# Patient Record
Sex: Female | Born: 1945
Health system: Southern US, Community
[De-identification: ages and names within clinical notes are randomized; demographics above are authoritative.]

## PROBLEM LIST (undated history)

## (undated) DIAGNOSIS — E781 Pure hyperglyceridemia: Secondary | ICD-10-CM

## (undated) DIAGNOSIS — Z8619 Personal history of other infectious and parasitic diseases: Secondary | ICD-10-CM

## (undated) HISTORY — DX: Personal history of other infectious and parasitic diseases: Z86.19

## (undated) HISTORY — DX: Pure hyperglyceridemia: E78.1

## (undated) HISTORY — PX: CHOLECYSTECTOMY: SHX55

---

## 1998-06-21 ENCOUNTER — Ambulatory Visit (HOSPITAL_COMMUNITY): Admission: RE | Admit: 1998-06-21 | Discharge: 1998-06-22 | Payer: Self-pay | Admitting: General Surgery

## 2001-03-12 ENCOUNTER — Encounter: Admission: RE | Admit: 2001-03-12 | Discharge: 2001-03-12 | Payer: Self-pay | Admitting: *Deleted

## 2001-03-12 ENCOUNTER — Encounter: Payer: Self-pay | Admitting: *Deleted

## 2001-04-21 ENCOUNTER — Other Ambulatory Visit: Admission: RE | Admit: 2001-04-21 | Discharge: 2001-04-21 | Payer: Self-pay | Admitting: *Deleted

## 2002-09-07 ENCOUNTER — Ambulatory Visit (HOSPITAL_COMMUNITY): Admission: RE | Admit: 2002-09-07 | Discharge: 2002-09-07 | Payer: Self-pay | Admitting: Gastroenterology

## 2003-06-23 ENCOUNTER — Encounter: Admission: RE | Admit: 2003-06-23 | Discharge: 2003-06-23 | Payer: Self-pay | Admitting: Family Medicine

## 2003-06-23 ENCOUNTER — Other Ambulatory Visit: Admission: RE | Admit: 2003-06-23 | Discharge: 2003-06-23 | Payer: Self-pay | Admitting: Family Medicine

## 2004-09-04 ENCOUNTER — Other Ambulatory Visit: Admission: RE | Admit: 2004-09-04 | Discharge: 2004-09-04 | Payer: Self-pay | Admitting: Family Medicine

## 2009-04-29 ENCOUNTER — Encounter: Admission: RE | Admit: 2009-04-29 | Discharge: 2009-04-29 | Payer: Self-pay | Admitting: Family Medicine

## 2009-06-20 ENCOUNTER — Encounter: Admission: RE | Admit: 2009-06-20 | Discharge: 2009-07-28 | Payer: Self-pay | Admitting: Family Medicine

## 2010-05-24 ENCOUNTER — Telehealth: Payer: Self-pay | Admitting: *Deleted

## 2010-05-24 NOTE — Telephone Encounter (Signed)
Open in error

## 2010-05-31 ENCOUNTER — Ambulatory Visit: Payer: Self-pay | Admitting: Internal Medicine

## 2010-06-05 ENCOUNTER — Ambulatory Visit (INDEPENDENT_AMBULATORY_CARE_PROVIDER_SITE_OTHER): Payer: BC Managed Care – PPO | Admitting: Internal Medicine

## 2010-06-05 ENCOUNTER — Encounter: Payer: Self-pay | Admitting: Internal Medicine

## 2010-06-05 VITALS — BP 145/90 | HR 66 | Ht 64.0 in | Wt 140.0 lb

## 2010-06-05 DIAGNOSIS — R030 Elevated blood-pressure reading, without diagnosis of hypertension: Secondary | ICD-10-CM

## 2010-06-05 DIAGNOSIS — H04123 Dry eye syndrome of bilateral lacrimal glands: Secondary | ICD-10-CM

## 2010-06-05 DIAGNOSIS — M949 Disorder of cartilage, unspecified: Secondary | ICD-10-CM

## 2010-06-05 DIAGNOSIS — Z0000 Encounter for general adult medical examination without abnormal findings: Secondary | ICD-10-CM

## 2010-06-05 DIAGNOSIS — J309 Allergic rhinitis, unspecified: Secondary | ICD-10-CM

## 2010-06-05 DIAGNOSIS — H04129 Dry eye syndrome of unspecified lacrimal gland: Secondary | ICD-10-CM

## 2010-06-05 DIAGNOSIS — J302 Other seasonal allergic rhinitis: Secondary | ICD-10-CM | POA: Insufficient documentation

## 2010-06-05 DIAGNOSIS — E781 Pure hyperglyceridemia: Secondary | ICD-10-CM | POA: Insufficient documentation

## 2010-06-05 DIAGNOSIS — R7301 Impaired fasting glucose: Secondary | ICD-10-CM | POA: Insufficient documentation

## 2010-06-05 DIAGNOSIS — M8580 Other specified disorders of bone density and structure, unspecified site: Secondary | ICD-10-CM | POA: Insufficient documentation

## 2010-06-05 DIAGNOSIS — M899 Disorder of bone, unspecified: Secondary | ICD-10-CM

## 2010-06-05 DIAGNOSIS — E782 Mixed hyperlipidemia: Secondary | ICD-10-CM

## 2010-06-05 NOTE — Patient Instructions (Addendum)
Monitor your Blood pressure  Readings  Contact us if 140/90 and up on a consistent basis. Check up with lab and Hg a1c    In the summer .  Continue lifestyle intervention healthy eating and exercise .

## 2010-06-05 NOTE — Progress Notes (Signed)
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  Her previous pcp was Dr Raquel James who since left her practice. Last check about October 2011.   She is generally well  But has  Ongoing medication for Hyperlipidemia and has been on Niaspan for about 5 years without se. She also has  Mild  hyperglycemia  Managed with life style intervention.  Seasonal allergies are rx with otc meds andno asthma.  ROS :  HEENT neg  Except for some hearing issue wax ineac  CV PULM: No Exercise intolerance  intolerance . chronic sx , Neg GI or GU and no bleeding swelling Neuro disease . She does have dry eyes on restasis Past hx reviewed records; hx of fracture foot  1995 Thyroididtis 1982 Child birth 32 Physical Exam: Vital signs reviewed MWU:XLKG is a well-developed well-nourished alert cooperative  white female who appears her stated age in no acute distress.  HEENT: normocephalic  traumatic , Eyes: PERRL EOM's full, conjunctiva clear, Nares: paten,t no deformity discharge or tenderness., Ears: no deformity  TMs with normal landmarks. Mouth: clear OP, no lesions, edema.  Moist mucous membranes. Dentition in adequate repair. NECK: supple without masses, thyromegaly or bruits. CHEST/PULM:  Clear to auscultation and percussion breath sounds equal no wheeze , rales or rhonchi. No chest wall deformities or tenderness. CV: PMI is nondisplaced, S1 S2 no gallops, murmurs, rubs. Peripheral pulses are full without delay.No JVD .  ABDOMEN: Bowel sounds normal nontender  No guard or rebound, no hepato splenomegal no CVA tenderness.  No hernia. Extremtities:  No clubbing cyanosis or edema, no acute joint swelling or redness no focal atrophy NEURO:  Oriented x3, cranial nerves 3-12 appear to be intact, no obvious focal weakness,gait within normal limits  SKIN: No acute rashes normal turgor, color, no bruising or petechiae. PSYCH: Oriented, good eye  contact, no obvious depression anxiety, cognition and judgment appear normal.  Has  Medical records to review   UTD on immuniz  Tdap 11 26  2007 and zostavax 1 6 2010, pneumovax 12 4 2010.  AP:  Hyperlipidemia  On niastapn Osteopenia Hyperglycemia   ? Predated the niaspan.  Family hx of DM in father Allergic rhinitis Elevated BP readings   Observe for HT review of records show highers 138 range  In 2011

## 2010-06-11 ENCOUNTER — Encounter: Payer: Self-pay | Admitting: Internal Medicine

## 2010-06-11 DIAGNOSIS — E782 Mixed hyperlipidemia: Secondary | ICD-10-CM | POA: Insufficient documentation

## 2010-06-11 DIAGNOSIS — Z0000 Encounter for general adult medical examination without abnormal findings: Secondary | ICD-10-CM | POA: Insufficient documentation

## 2010-06-11 DIAGNOSIS — H04123 Dry eye syndrome of bilateral lacrimal glands: Secondary | ICD-10-CM | POA: Insufficient documentation

## 2010-06-11 NOTE — Assessment & Plan Note (Signed)
She will monitor her bp readings and call or return earlier if elevated consistently

## 2010-06-11 NOTE — Assessment & Plan Note (Signed)
Monitoring lsi    A1c   Has been in high 5 range .

## 2010-06-11 NOTE — Assessment & Plan Note (Signed)
On niaspan for at least 5 years  And no se of meds.   Continues  exercise and diet changes

## 2010-07-17 ENCOUNTER — Encounter: Payer: Self-pay | Admitting: Internal Medicine

## 2010-08-01 ENCOUNTER — Other Ambulatory Visit: Payer: Self-pay | Admitting: *Deleted

## 2010-08-01 MED ORDER — NIACIN ER (ANTIHYPERLIPIDEMIC) 500 MG PO TBCR: 500.0000 mg | EXTENDED_RELEASE_TABLET | Freq: Every day | ORAL | Status: DC

## 2010-08-01 NOTE — Telephone Encounter (Signed)
Refill on niaspan

## 2010-08-16 ENCOUNTER — Encounter: Payer: Self-pay | Admitting: Internal Medicine

## 2010-08-28 ENCOUNTER — Encounter: Payer: Self-pay | Admitting: Internal Medicine

## 2010-09-08 NOTE — Op Note (Signed)
   Tina [name], Tina [name]                             ACCOUNT NO.:  [id number]   MEDICAL RECORD NO.:  [id number]                   PATIENT TYPE:  AMB   LOCATION:  ENDO                                 FACILITY:  [facility]   PHYSICIAN:  Anselmo Rod, M.D.               DATE OF BIRTH:  07-Dec-1945   DATE OF PROCEDURE:  09/07/2002  DATE OF DISCHARGE:                                 OPERATIVE REPORT   PROCEDURE PERFORMED:  Screening colonoscopy.   ENDOSCOPIST:  Anselmo Rod, M.D.   INSTRUMENT USED:  Olympus video colonoscope.   INDICATIONS FOR PROCEDURE:  A 65 year old white female undergoing screening  colonoscopy to rule out colonic polyps, masses, etc.   PREPROCEDURE PREPARATION:  Informed consent was procured from the patient.  The patient had fasted for eight hours prior to the procedure and was  prepped with a bottle of magnesium citrate and a gallon of GoLYTELY the  night prior to the procedure.   PREPROCEDURE PHYSICAL:  VITAL SIGNS:  The patient had stable vital signs.  NECK:  Supple.  CHEST: Clear to auscultation.  S1 and S2 regular.  ABDOMEN: Soft with normal bowel sounds.   DESCRIPTION OF PROCEDURE:  The patient was placed in the left lateral  decubitus position and sedated with 50 mg of Demerol and 5 mg of Versed  intravenously.  Once the patient was adequately sedated and maintained on  low flow oxygen, continuous cardiac monitoring, the Olympus video  colonoscope was advanced from the rectum, to the cecum and terminal ileum  without difficulty.  No masses, polyps, erosions, ulcerations or diverticula  were seen.  The entire exam was normal with healthy-appearing colonic  mucosa.  Retroflexion in the rectum revealed no abnormalities as well.   IMPRESSION:  Normal colonoscopy up to the terminal ileum.   RECOMMENDATIONS:  1. A high fiber diet with liberal fluid intake has been advised.  2.     Repeat colorectal cancer screening is recommended in the next 10 years  unless the patient develops any abdominal symptoms in the interim.  3. Outpatient follow up on a p.r.n. basis.                                               Anselmo Rod, M.D.    JNM/MEDQ  D:  09/07/2002  T:  09/07/2002  Job:  [id number]   cc:   Christella Noa, M.D.  [address] [place]  [place], [place] [address]  Fax: [phone]

## 2010-11-07 ENCOUNTER — Other Ambulatory Visit (INDEPENDENT_AMBULATORY_CARE_PROVIDER_SITE_OTHER): Payer: BC Managed Care – PPO

## 2010-11-07 DIAGNOSIS — Z0000 Encounter for general adult medical examination without abnormal findings: Secondary | ICD-10-CM

## 2010-11-07 LAB — CBC WITH DIFFERENTIAL/PLATELET
Basophils Absolute: 0 10*3/uL (ref 0.0–0.1)
Basophils Relative: 0.6 % (ref 0.0–3.0)
Eosinophils Absolute: 0.2 10*3/uL (ref 0.0–0.7)
MCHC: 33.8 g/dL (ref 30.0–36.0)
MCV: 87.6 fl (ref 78.0–100.0)
Monocytes Absolute: 0.3 10*3/uL (ref 0.1–1.0)
Neutrophils Relative %: 50.1 % (ref 43.0–77.0)
Platelets: 284 10*3/uL (ref 150.0–400.0)
RBC: 4.56 Mil/uL (ref 3.87–5.11)
RDW: 14.7 % — ABNORMAL HIGH (ref 11.5–14.6)

## 2010-11-07 LAB — BASIC METABOLIC PANEL
BUN: 18 mg/dL (ref 6–23)
CO2: 27 mEq/L (ref 19–32)
Chloride: 108 mEq/L (ref 96–112)
Creatinine, Ser: 0.8 mg/dL (ref 0.4–1.2)
Glucose, Bld: 113 mg/dL — ABNORMAL HIGH (ref 70–99)

## 2010-11-07 LAB — POCT URINALYSIS DIPSTICK
Ketones, UA: NEGATIVE
Leukocytes, UA: NEGATIVE
Protein, UA: NEGATIVE
pH, UA: 7.5

## 2010-11-07 LAB — HEPATIC FUNCTION PANEL
ALT: 25 U/L (ref 0–35)
Total Bilirubin: 0.6 mg/dL (ref 0.3–1.2)

## 2010-11-07 LAB — LIPID PANEL
Total CHOL/HDL Ratio: 3
VLDL: 17 mg/dL (ref 0.0–40.0)

## 2010-11-07 LAB — LDL CHOLESTEROL, DIRECT: Direct LDL: 160.4 mg/dL

## 2010-11-15 ENCOUNTER — Encounter: Payer: Self-pay | Admitting: Internal Medicine

## 2010-11-15 ENCOUNTER — Ambulatory Visit (INDEPENDENT_AMBULATORY_CARE_PROVIDER_SITE_OTHER): Payer: BC Managed Care – PPO | Admitting: Internal Medicine

## 2010-11-15 VITALS — BP 160/90 | HR 92 | Wt 139.0 lb

## 2010-11-15 DIAGNOSIS — E782 Mixed hyperlipidemia: Secondary | ICD-10-CM

## 2010-11-15 DIAGNOSIS — M25549 Pain in joints of unspecified hand: Secondary | ICD-10-CM

## 2010-11-15 DIAGNOSIS — J309 Allergic rhinitis, unspecified: Secondary | ICD-10-CM

## 2010-11-15 DIAGNOSIS — R030 Elevated blood-pressure reading, without diagnosis of hypertension: Secondary | ICD-10-CM

## 2010-11-15 DIAGNOSIS — J302 Other seasonal allergic rhinitis: Secondary | ICD-10-CM

## 2010-11-15 MED ORDER — FLUTICASONE PROPIONATE 50 MCG/ACT NA SUSP: 2.0000 | Freq: Every day | NASAL | Status: DC

## 2010-11-15 NOTE — Progress Notes (Signed)
  Subjective:    Patient ID: Tina [name], female    DOB: [birth year], 65 y.o.   MRN: [id number]  HPI  Comes in today for  multiple medical problems  BP: reading at home very good 2 reading in 140s.  Brings in readings with her machine to review,   Pain left PIP index finger  Left hand  Not hot  Or red  And overnight increase and then gets better . No rx and no trauma or reason for this   Allergy and nose stuffiness    Zyrtec.   Not a lot or runny . No cough or asthma sx .   LIPIDS: taking niaspa 500 per day no se .   alos otc omega 3  Review of Systems Neg cp sob vision change. abd pain uti sx and bruising or bleeding . Neuro sx   Past history family history social history reviewed in the electronic medical record.     Objective:   Physical Exam WDWN in nad  HEENT: mild congestion  nose  No dc  Chest:  Clear to A&P without wheezes rales or rhonchi CV:  S1-S2 no gallops or murmurs peripheral perfusion is normal Extr no swelling or synovitis  Good grip some oa changes . Review  Bp readings    a1c 6.2  See lipids  Potassium 5.4  prob lab effect    Assessment & Plan:  BP readings   Poss white coat effect .    Bring in machine to next visit and keep monitoring for now.  Hyperlipidemia    No se of meds  Consider increase to 1000 niaspan prefers to stay at same dose and Intensify lifestyle interventions.  Hand sx poss early OA but not devilitating and no other assoc sx  .  Will follow  Up if  persistent or progressive   Allergic rhinitis Disc  rx options   Add nasal steroids for th stuffiness.    LIPIDS BG Hand s prob mild  OA but fu if  persistent or progressive  Allergic rhinitis seasonal.   Total visit > 50% spent counseling and coordinating care

## 2010-11-15 NOTE — Patient Instructions (Signed)
Continue lifestyle intervention healthy eating and exercise . Add nasal cortisone for allergic  Stuffy nose. Recheck if problems with joints. Your blood pressure is good at home. Bring in your machine at your next visit.

## 2010-11-18 ENCOUNTER — Encounter: Payer: Self-pay | Admitting: Internal Medicine

## 2010-11-18 DIAGNOSIS — M25549 Pain in joints of unspecified hand: Secondary | ICD-10-CM | POA: Insufficient documentation

## 2010-12-22 ENCOUNTER — Other Ambulatory Visit: Payer: Self-pay | Admitting: *Deleted

## 2010-12-22 MED ORDER — NIACIN ER (ANTIHYPERLIPIDEMIC) 500 MG PO TBCR: 500.0000 mg | EXTENDED_RELEASE_TABLET | Freq: Every day | ORAL | Status: DC

## 2010-12-22 NOTE — Telephone Encounter (Signed)
Refill rx

## 2011-04-29 ENCOUNTER — Other Ambulatory Visit: Payer: Self-pay | Admitting: Internal Medicine

## 2011-06-29 ENCOUNTER — Other Ambulatory Visit (INDEPENDENT_AMBULATORY_CARE_PROVIDER_SITE_OTHER): Payer: BC Managed Care – PPO

## 2011-06-29 DIAGNOSIS — Z0000 Encounter for general adult medical examination without abnormal findings: Secondary | ICD-10-CM

## 2011-06-29 LAB — HEPATIC FUNCTION PANEL
ALT: 17 U/L (ref 0–35)
AST: 20 U/L (ref 0–37)
Albumin: 4 g/dL (ref 3.5–5.2)
Alkaline Phosphatase: 65 U/L (ref 39–117)
Total Bilirubin: 0.6 mg/dL (ref 0.3–1.2)

## 2011-06-29 LAB — BASIC METABOLIC PANEL
GFR: 83.85 mL/min (ref >60.00)
Potassium: 5 mEq/L (ref 3.5–5.1)
Sodium: 139 mEq/L (ref 135–145)

## 2011-06-29 LAB — CBC WITH DIFFERENTIAL/PLATELET
Eosinophils Relative: 3.2 % (ref 0.0–5.0)
HCT: 40.4 % (ref 36.0–46.0)
Hemoglobin: 13.6 g/dL (ref 12.0–15.0)
Lymphocytes Relative: 36.5 % (ref 12.0–46.0)
Lymphs Abs: 1.9 10*3/uL (ref 0.7–4.0)
Monocytes Relative: 7.4 % (ref 3.0–12.0)
Neutro Abs: 2.7 10*3/uL (ref 1.4–7.7)
WBC: 5.1 10*3/uL (ref 4.5–10.5)

## 2011-06-29 LAB — LIPID PANEL
Total CHOL/HDL Ratio: 3
VLDL: 15.8 mg/dL (ref 0.0–40.0)

## 2011-06-29 LAB — TSH: TSH: 1.98 u[IU]/mL (ref 0.35–5.50)

## 2011-07-06 ENCOUNTER — Encounter: Payer: Self-pay | Admitting: Internal Medicine

## 2011-07-06 ENCOUNTER — Ambulatory Visit (INDEPENDENT_AMBULATORY_CARE_PROVIDER_SITE_OTHER): Payer: BC Managed Care – PPO | Admitting: Internal Medicine

## 2011-07-06 ENCOUNTER — Other Ambulatory Visit (HOSPITAL_COMMUNITY)
Admission: RE | Admit: 2011-07-06 | Discharge: 2011-07-06 | Disposition: A | Discharge status: Home / Self Care | Payer: BC Managed Care – PPO | Source: Ambulatory Visit | Attending: Internal Medicine | Admitting: Internal Medicine

## 2011-07-06 VITALS — BP 150/80 | HR 66 | Ht 64.25 in | Wt 137.0 lb

## 2011-07-06 DIAGNOSIS — Z01419 Encounter for gynecological examination (general) (routine) without abnormal findings: Secondary | ICD-10-CM | POA: Insufficient documentation

## 2011-07-06 DIAGNOSIS — E782 Mixed hyperlipidemia: Secondary | ICD-10-CM

## 2011-07-06 DIAGNOSIS — Z136 Encounter for screening for cardiovascular disorders: Secondary | ICD-10-CM

## 2011-07-06 DIAGNOSIS — M8580 Other specified disorders of bone density and structure, unspecified site: Secondary | ICD-10-CM

## 2011-07-06 DIAGNOSIS — Z0000 Encounter for general adult medical examination without abnormal findings: Secondary | ICD-10-CM

## 2011-07-06 DIAGNOSIS — R030 Elevated blood-pressure reading, without diagnosis of hypertension: Secondary | ICD-10-CM

## 2011-07-06 DIAGNOSIS — R9431 Abnormal electrocardiogram [ECG] [EKG]: Secondary | ICD-10-CM

## 2011-07-06 DIAGNOSIS — M899 Disorder of bone, unspecified: Secondary | ICD-10-CM

## 2011-07-06 NOTE — Progress Notes (Signed)
Subjective:    Patient ID: Tina [name], female    DOB: 01-06-1946, 66 y.o.   MRN: [id number]  HPI Patient comes in today for preventive visit and follow-up of medical issues. Update  history since  last visit: Doing well no  Sig change in health status  Brought in Bp machine  BP at home in 125 range and had nl reading at the Dentist   Was 120s this and and elevated here  Vit d suppl.    Eats dairy calcium foods   Review of Systems ROS:  GEN/ HEENT: No fever, significant weight changes sweats headaches vision problems hearing changes, CV/ PULM; No chest pain shortness of breath cough, syncope,edema  change in exercise tolerance. GI /GU: No adominal pain, vomiting, change in bowel habits. No blood in the stool. No significant GU symptoms. SKIN/HEME: ,no acute skin rashes suspicious lesions or bleeding. No lymphadenopathy, nodules, masses.  NEURO/ PSYCH:  No neurologic signs such as weakness numbness. No depression anxiety. IMM/ Allergy: No unusual infections.  Allergy .    Has some and on zyrtec  And   flonase helps some  REST of 12 system review negative except as per HPI Outpatient Prescriptions Prior to Visit  Medication Sig Dispense Refill  . calcium citrate (CALCITRATE - DOSED IN MG ELEMENTAL CALCIUM) 950 MG tablet Take 1 tablet by mouth daily.        . cycloSPORINE (RESTASIS) 0.05 % ophthalmic emulsion 1 drop 2 (two) times daily. Hebert Soho, MD gives this to pt.      . fluticasone (FLONASE) 50 MCG/ACT nasal spray Place 2 sprays into the nose daily.  16 g  6  . MULTIPLE VITAMIN PO Take by mouth.        Marland Kitchen NIASPAN 500 MG CR tablet TAKE 1 TABLET AT BEDTIME  30 tablet  3  . Omega-3 Fatty Acids (OMEGA 3 PO) Take by mouth.         Past history family history social history reviewed in the electronic medical record.     Objective:   Physical Exam  Wt Readings from Last 3 Encounters:  07/06/11 137 lb (62.143 kg)  11/15/10 139 lb (63.05 kg)  06/05/10 140 lb (63.504 kg)     Physical Exam: Vital signs reviewed NWG:NFAO is a well-developed well-nourished alert cooperative  white female who appears her stated age in no acute distress.  HEENT: normocephalic atraumatic , Eyes: PERRL EOM's full, conjunctiva clear, Nares: paten,t no deformity discharge or tenderness., Ears: no deformity EAC's clear TMs with normal landmarks. Mouth: clear OP, no lesions, edema.  Moist mucous membranes. Dentition in adequate repair. NECK: supple without masses, thyromegaly or bruits. Breast: normal by inspection . No dimpling, discharge, masses, tenderness or discharge . CHEST/PULM:  Clear to auscultation and percussion breath sounds equal no wheeze , rales or rhonchi. No chest wall deformities or tenderness. CV: PMI is nondisplaced, S1 S2 no gallops, murmurs, rubs. Peripheral pulses are full without delay.No JVD . Repeat BP readings 147/80  ABDOMEN: Bowel sounds normal nontender  No guard or rebound, no hepato splenomegal no CVA tenderness.  No hernia. Extremtities:  No clubbing cyanosis or edema, no acute joint swelling or redness no focal atrophy NEURO:  Oriented x3, cranial nerves 3-12 appear to be intact, no obvious focal weakness,gait within normal limits no abnormal reflexes or asymmetrical SKIN: No acute rashes normal turgor, color, no bruising or petechiae. PSYCH: Oriented, good eye contact, no obvious depression anxiety, cognition and judgment appear normal. LN: no  cervical axillary inguinal adenopathy Pelvic: NL ext GU, labia clear without lesions or rash . Vagina no lesions .Cervix: clear  UTERUS: Neg CMT Adnexa:  clear no masses . PAP done RECTAL neg masses stool neg      Lab Results  Component Value Date   WBC 5.1 06/29/2011   HGB 13.6 06/29/2011   HCT 40.4 06/29/2011   PLT 252.0 06/29/2011   GLUCOSE 101* 06/29/2011   CHOL 193 06/29/2011   TRIG 79.0 06/29/2011   HDL 66.60 06/29/2011   LDLDIRECT 160.4 11/07/2010   LDLCALC 111* 06/29/2011   ALT 17 06/29/2011   AST 20 06/29/2011   NA  139 06/29/2011   K 5.0 06/29/2011   CL 105 06/29/2011   CREATININE 0.7 06/29/2011   BUN 18 06/29/2011   CO2 27 06/29/2011   TSH 1.98 06/29/2011   HGBA1C 6.2 11/07/2010   EKG sr  Rate 78 diffuse st depression 1 mm no acute changes      Assessment & Plan:  Preventive Health Care Counseled regarding healthy nutrition, exercise, sleep, injury prevention, calcium vit d and healthy weight . Gyne exam nl  LIPIDS good today  BP readings  Elevated   Nl at home and   Her machine correlated with office reading.   Get readings at home rec rx if elevated otherwise .   Disc importance of bp control Allergic  IFG borderline  Ok today lsi    Will review prev ekg she has no   Sx  Of cv compromise at this time and exercise makes her feel better.

## 2011-07-06 NOTE — Patient Instructions (Addendum)
Continue lifestyle intervention healthy eating and exercise . Will let you know if  Ned further  Fu for the ekg Make sure the blood pressure is controlled    Check for now about 3 x per week.   Contact us if  BP  Is elevated regularly 140/90 and above.   Yearly check

## 2011-07-07 DIAGNOSIS — R9431 Abnormal electrocardiogram [ECG] [EKG]: Secondary | ICD-10-CM | POA: Insufficient documentation

## 2011-07-16 NOTE — Progress Notes (Signed)
Quick Note:  Tell patient PAP is normal. ______ 

## 2011-07-18 ENCOUNTER — Encounter: Payer: Self-pay | Admitting: *Deleted

## 2011-07-18 NOTE — Progress Notes (Signed)
Quick Note:    Letter sent to pt.  ______

## 2011-07-19 NOTE — Progress Notes (Signed)
Quick Note:  Left a message for pt to return call. ______ 

## 2011-07-23 NOTE — Progress Notes (Signed)
Quick Note:  Pt aware and states myoview can be set up. Pt is leaving to go out of town on 4/4 and will return on 4/8. ______

## 2011-07-24 NOTE — Progress Notes (Signed)
Addended by: Azucena Freed on: 07/24/2011 04:09 PM   Modules accepted: Orders

## 2011-07-24 NOTE — Progress Notes (Signed)
Quick Note:  Myoview ordered ______

## 2011-08-06 ENCOUNTER — Ambulatory Visit (HOSPITAL_COMMUNITY): Payer: BC Managed Care – PPO | Attending: Internal Medicine | Admitting: Radiology

## 2011-08-06 VITALS — BP 139/82 | Ht 64.0 in | Wt 136.0 lb

## 2011-08-06 DIAGNOSIS — R002 Palpitations: Secondary | ICD-10-CM | POA: Insufficient documentation

## 2011-08-06 DIAGNOSIS — E785 Hyperlipidemia, unspecified: Secondary | ICD-10-CM | POA: Insufficient documentation

## 2011-08-06 DIAGNOSIS — Z87891 Personal history of nicotine dependence: Secondary | ICD-10-CM | POA: Insufficient documentation

## 2011-08-06 DIAGNOSIS — R9431 Abnormal electrocardiogram [ECG] [EKG]: Secondary | ICD-10-CM

## 2011-08-06 MED ORDER — TECHNETIUM TC 99M TETROFOSMIN IV KIT
33.0000 | PACK | Freq: Once | INTRAVENOUS | Status: AC | PRN
  Administered 2011-08-06: 33 via INTRAVENOUS

## 2011-08-06 MED ORDER — TECHNETIUM TC 99M TETROFOSMIN IV KIT
10.3000 | PACK | Freq: Once | INTRAVENOUS | Status: AC | PRN
  Administered 2011-08-06: 10 via INTRAVENOUS

## 2011-08-06 NOTE — Progress Notes (Signed)
[facility] SITE 3 NUCLEAR MED [address] [place] [address] [phone]  Cardiology Nuclear Med Study  Tina [name] is a 66 y.o. female     MRN : [id number]     DOB: [birth year]  Procedure Date: 08/06/2011  Nuclear Med Background Indication for Stress Test:  Evaluation for Ischemia and Abnormal EKG History:  No prior cardiac risk factors Cardiac Risk Factors: History of Smoking and Lipids  Symptoms:  Palpitations   Nuclear Pre-Procedure Caffeine/Decaff Intake:  None NPO After: 8:00pm   Lungs:  Clear. IV 0.9% NS with Angio Cath:  22g  IV Site: R Antecubital  IV Started by:  Stanton Kidney, EMT-P  Chest Size (in):  36 Cup Size: C  Height: 5\' 4"  (1.626 m)  Weight:  136 lb (61.689 kg)  BMI:  Body mass index is 23.34 kg/(m^2). Tech Comments:  NA    Nuclear Med Study 1 or 2 day study: 1 day  Stress Test Type:  Stress  Reading MD: Dietrich Pates, MD  Order Authorizing Provider:  Berniece Andreas, MD  Resting Radionuclide: Technetium 12m Tetrofosmin  Resting Radionuclide Dose: 10.3 mCi   Stress Radionuclide:  Technetium 33m Tetrofosmin  Stress Radionuclide Dose: 33.0 mCi           Stress Protocol Rest HR: 63 Stress HR: 153  Rest BP: 139/82 Stress BP: 172/80  Exercise Time (min): 7:30 METS: 9.3   Predicted Max HR: 156 bpm % Max HR: 98.08 bpm Rate Pressure Product: 21308   Dose of Adenosine (mg):  n/a Dose of Lexiscan: n/a mg  Dose of Atropine (mg): n/a Dose of Dobutamine: n/a mcg/kg/min (at max HR)  Stress Test Technologist: Smiley Houseman, CMA-N  Nuclear Technologist:  Domenic Polite, CNMT     Rest Procedure:  Myocardial perfusion imaging was performed at rest 45 minutes following the intravenous administration of Technetium 61m Tetrofosmin.  Rest ECG: Minor, nonspecific ST-T wave changes.  Stress Procedure:  The patient exercised on the treadmill utilizing the Bruce protocol for 7:30 minutes. She then stopped due to fatigue and denied any chest pain.  There  were no diagnostic ST-T wave changes.  Technetium 58m Tetrofosmin was injected at peak exercise and myocardial perfusion imaging was performed after a brief delay.  Stress ECG: No significant change from baseline ECG  QPS Raw Data Images:  Images were motion corrected.  Soft tissue (diaphragm) underlies inferior wall. Stress Images:  Normal homogeneous uptake in all areas of the myocardium. Rest Images:  Normal homogeneous uptake in all areas of the myocardium. Subtraction (SDS):  No evidence of ischemia. Transient Ischemic Dilatation (Normal <1.22):  0.78 Lung/Heart Ratio (Normal <0.45):  0.34  Quantitative Gated Spect Images QGS EDV:  47 ml QGS ESV:  11 ml  Impression Exercise Capacity:  Good exercise capacity. BP Response:  Normal blood pressure response. Clinical Symptoms:  No chest pain. ECG Impression:  No significant ST segment change suggestive of ischemia. Comparison with Prior Nuclear Study: No previous nuclear study performed  Overall Impression:  Normal stress nuclear study.  LV Ejection Fraction: 77%.  LV Wall Motion:  NL LV Function; NL Wall Motion  Dietrich Pates

## 2011-09-07 ENCOUNTER — Other Ambulatory Visit: Payer: Self-pay | Admitting: Internal Medicine

## 2011-09-18 ENCOUNTER — Ambulatory Visit (INDEPENDENT_AMBULATORY_CARE_PROVIDER_SITE_OTHER): Payer: BC Managed Care – PPO | Admitting: Internal Medicine

## 2011-09-18 ENCOUNTER — Encounter: Payer: Self-pay | Admitting: Internal Medicine

## 2011-09-18 VITALS — BP 126/90 | HR 88 | Temp 98.7°F | Wt 139.0 lb

## 2011-09-18 DIAGNOSIS — R6889 Other general symptoms and signs: Secondary | ICD-10-CM

## 2011-09-18 DIAGNOSIS — H938X9 Other specified disorders of ear, unspecified ear: Secondary | ICD-10-CM

## 2011-09-18 DIAGNOSIS — H6980 Other specified disorders of Eustachian tube, unspecified ear: Secondary | ICD-10-CM

## 2011-09-18 DIAGNOSIS — J302 Other seasonal allergic rhinitis: Secondary | ICD-10-CM

## 2011-09-18 DIAGNOSIS — J309 Allergic rhinitis, unspecified: Secondary | ICD-10-CM

## 2011-09-18 MED ORDER — PREDNISONE 20 MG PO TABS: ORAL_TABLET | ORAL | Status: AC

## 2011-09-18 NOTE — Progress Notes (Signed)
  Subjective:    Patient ID: Tina [name], female    DOB: [birth year], 66 y.o.   MRN: [id number]  HPI  Patient comes in today for SDA for  new problem evaluation. Recent dec hearing right ear like  Congested or fluid in ear ? Ear wax but has some right facila pain or discomfort near jau . No tmj vision changes numbness.  Some congestion and no fever no change.  On zyrtec and flonase. Review of Systems Neg fever ha cp sob vomiting vision change Past history family history social history reviewed in the electronic medical record. To travel this weekend    Objective:   Physical Exam BP 126/90  Pulse 88  Temp(Src) 98.7 F (37.1 C) (Oral)  Wt 139 lb (63.05 kg)  SpO2 99% HEENT: Normocephalic ;atraumatic , Eyes;  PERRL, EOMs  Full, lids and conjunctiva clear,,Ears: no deformities, canals nl, TM landmarks normal,  Right   slightly retracted  Face minor tenderness right Nose: no deformity or discharge congested Mouth : OP clear without lesion or edema . Mild erythema Neck: Supple without adenopathy or masses or bruits Chest:  Clear to A&P without wheezes rales or rhonchi CV:  S1-S2 no gallops or murmurs peripheral perfusion is normal Neuro grossly normal otherwise    Assessment & Plan:  Right ear sx  Poss allergic   Eustachian tube dysfunction. Continue her allergy regimen add decongestant and gets in a side effect and perhaps prednisone x5 days followup or contact us if persistent and/or progressive.  Risk benefit of medication discussed.

## 2011-09-18 NOTE — Patient Instructions (Signed)
The symptoms you have may be eustachian tube dysfunction with decreased air pressure equalization in the middle ear.  This could be from allergies. Try a gentle decongestant and saline in addition to your Flonase and antihistamine if this does not improve in a day or 2 you can add prednisone to decrease allergy inflammation in the sinus.  Contact us if persistent or progressive pain or fever.

## 2011-11-19 ENCOUNTER — Ambulatory Visit (INDEPENDENT_AMBULATORY_CARE_PROVIDER_SITE_OTHER): Payer: BC Managed Care – PPO | Admitting: Internal Medicine

## 2011-11-19 ENCOUNTER — Encounter: Payer: Self-pay | Admitting: Internal Medicine

## 2011-11-19 ENCOUNTER — Telehealth: Payer: Self-pay | Admitting: Internal Medicine

## 2011-11-19 VITALS — BP 160/100 | HR 87 | Temp 98.6°F | Wt 136.0 lb

## 2011-11-19 DIAGNOSIS — J020 Streptococcal pharyngitis: Secondary | ICD-10-CM | POA: Insufficient documentation

## 2011-11-19 DIAGNOSIS — J029 Acute pharyngitis, unspecified: Secondary | ICD-10-CM

## 2011-11-19 LAB — POCT RAPID STREP A (OFFICE): Rapid Strep A Screen: POSITIVE — AB

## 2011-11-19 MED ORDER — AMOXICILLIN 500 MG PO CAPS: 500.0000 mg | ORAL_CAPSULE | Freq: Two times a day (BID) | ORAL | Status: AC

## 2011-11-19 NOTE — Patient Instructions (Signed)
Antibiotic as discussed .    Strep Throat Strep throat is an infection of the throat caused by a bacteria named Streptococcus pyogenes. Your caregiver may call the infection streptococcal "tonsillitis" or "pharyngitis" depending on whether there are signs of inflammation in the tonsils or back of the throat. Strep throat is most common in children from 16 to 65 years old during the cold months of the year, but it can occur in people of any age during any season. This infection is spread from person to person (contagious) through coughing, sneezing, or other close contact. SYMPTOMS   Fever or chills.   Painful, swollen, red tonsils or throat.   Pain or difficulty when swallowing.   White or yellow spots on the tonsils or throat.   Swollen, tender lymph nodes or "glands" of the neck or under the jaw.   Red rash all over the body (rare).  DIAGNOSIS  Many different infections can cause the same symptoms. A test must be done to confirm the diagnosis so the right treatment can be given. A "rapid strep test" can help your caregiver make the diagnosis in a few minutes. If this test is not available, a light swab of the infected area can be used for a throat culture test. If a throat culture test is done, results are usually available in a day or two. TREATMENT  Strep throat is treated with antibiotic medicine. HOME CARE INSTRUCTIONS   Gargle with 1 tsp of salt in 1 cup of warm water, 3 to 4 times per day or as needed for comfort.   Family members who also have a sore throat or fever should be tested for strep throat and treated with antibiotics if they have the strep infection.   Make sure everyone in your household washes their hands well.   Do not share food, drinking cups, or personal items that could cause the infection to spread to others.   You may need to eat a soft food diet until your sore throat gets better.   Drink enough water and fluids to keep your urine clear or pale yellow.  This will help prevent dehydration.   Get plenty of rest.   Stay home from school, daycare, or work until you have been on antibiotics for 24 hours.   Only take over-the-counter or prescription medicines for pain, discomfort, or fever as directed by your caregiver.   If antibiotics are prescribed, take them as directed. Finish them even if you start to feel better.  SEEK MEDICAL CARE IF:   The glands in your neck continue to enlarge.   You develop a rash, cough, or earache.   You cough up green, yellow-Mathison, or bloody sputum.   You have pain or discomfort not controlled by medicines.   Your problems seem to be getting worse rather than better.  SEEK IMMEDIATE MEDICAL CARE IF:   You develop any new symptoms such as vomiting, severe headache, stiff or painful neck, chest pain, shortness of breath, or trouble swallowing.   You develop severe throat pain, drooling, or changes in your voice.   You develop swelling of the neck, or the skin on the neck becomes red and tender.   You have a fever.   You develop signs of dehydration, such as fatigue, dry mouth, and decreased urination.   You become increasingly sleepy, or you cannot wake up completely.  Document Released: 04/06/2000 Document Revised: 03/29/2011 Document Reviewed: 06/08/2010 [facility] Patient Information 2012 [place], [place].

## 2011-11-19 NOTE — Telephone Encounter (Signed)
Caller: Keyshawna/Patient; PCP: Madelin Headings.; CB#: [phone]; ; ; Call regarding Sore Throat;  Onset-11/15/11   Afebrile this morning.  Pt c/o of sore throat and swollen glands in neck. She had low grade fever over the weekend. She was exposed to grandaughter who was dx with Strep throat. Emergent s/s of Sore throat protocol r/o. Pt to see provider within 24hrs. Appt scheduled for today at 9:45am with Dr. Fabian Sharp.

## 2011-11-19 NOTE — Progress Notes (Signed)
  Subjective:    Patient ID: Tina [name], female    DOB: [birth year], 66 y.o.   MRN: [id number]  HPI Patient comes in today for SDA for  new problem evaluation. Was exposed to her granddaughter a week ago who had documented strep about age 82. She began having a sore throat and then fever over the last 3-4 days it did not turn into a cold although she took a decongestant and ibuprofen Tylenol-type medications. She now has a continued sore throat no vomiting headache unusual rashes. No past history of sore throats she did just move her 65 year old mom to town has some questions about communicability and risk.  Outpatient Encounter Prescriptions as of 11/19/2011  Medication Sig Dispense Refill  . calcium citrate (CALCITRATE - DOSED IN MG ELEMENTAL CALCIUM) 950 MG tablet Take 1 tablet by mouth daily.        . cholecalciferol (VITAMIN D) 1000 UNITS tablet Take 1,000 Units by mouth daily.      . MULTIPLE VITAMIN PO Take by mouth.        Marland Kitchen NIASPAN 500 MG CR tablet TAKE 1 TABLET AT BEDTIME  30 tablet  3  . Omega-3 Fatty Acids (OMEGA 3 PO) Take by mouth.        . cycloSPORINE (RESTASIS) 0.05 % ophthalmic emulsion 1 drop 2 (two) times daily. Hebert Soho, MD gives this to pt.      . fluticasone (FLONASE) 50 MCG/ACT nasal spray Place 2 sprays into the nose daily.  16 g  6   Review of Systems Negative for chest pain shortness of breath coughing syncope unusual rashes rest as per history of present illness Past history family history social history reviewed in the electronic medical record.     Objective:   Physical Exam BP 160/100  Pulse 87  Temp 98.6 F (37 C) (Oral)  Wt 136 lb (61.689 kg)  SpO2 97% WDWN in nad  HEENT: Normocephalic ;atraumatic , Eyes;  PERRL, EOMs  Full, lids and conjunctiva clear,,Ears: no deformities, canals nl, TM landmarks normal, Nose: no deformity or discharge  Mouth : OP  Red 2+ with uvula infovlemnet  No exudate    NEck supple tender ac adenopathy 1-2+ no pc nodes   CV  no g or m  Skin: normal capillary refill ,turgor , color: No acute rashes ,petechiae or bruising rs positive .    Assessment & Plan:  Acute GABS pharyngitis  With exposure hx. Disc communicability  Risks   rx amox as discussed.  dont think mom at high risk unless exposed to children  Like she was.

## 2011-11-28 ENCOUNTER — Other Ambulatory Visit: Payer: Self-pay | Admitting: Internal Medicine

## 2012-01-09 ENCOUNTER — Encounter: Payer: Self-pay | Admitting: Internal Medicine

## 2012-01-10 ENCOUNTER — Other Ambulatory Visit: Payer: Self-pay | Admitting: Internal Medicine

## 2012-06-30 ENCOUNTER — Other Ambulatory Visit: Payer: BC Managed Care – PPO

## 2012-07-02 ENCOUNTER — Other Ambulatory Visit (INDEPENDENT_AMBULATORY_CARE_PROVIDER_SITE_OTHER): Payer: BC Managed Care – PPO

## 2012-07-02 DIAGNOSIS — Z0000 Encounter for general adult medical examination without abnormal findings: Secondary | ICD-10-CM

## 2012-07-02 LAB — CBC WITH DIFFERENTIAL/PLATELET
Basophils Relative: 0.6 % (ref 0.0–3.0)
Eosinophils Relative: 2.7 % (ref 0.0–5.0)
HCT: 37.4 % (ref 36.0–46.0)
Hemoglobin: 12.7 g/dL (ref 12.0–15.0)
Lymphs Abs: 1.7 10*3/uL (ref 0.7–4.0)
Monocytes Relative: 7.2 % (ref 3.0–12.0)
Neutro Abs: 2.1 10*3/uL (ref 1.4–7.7)
RBC: 4.26 Mil/uL (ref 3.87–5.11)
WBC: 4.3 10*3/uL — ABNORMAL LOW (ref 4.5–10.5)

## 2012-07-02 LAB — HEPATIC FUNCTION PANEL
ALT: 20 U/L (ref 0–35)
AST: 21 U/L (ref 0–37)
Bilirubin, Direct: 0.1 mg/dL (ref 0.0–0.3)
Total Bilirubin: 1.1 mg/dL (ref 0.3–1.2)
Total Protein: 6.6 g/dL (ref 6.0–8.3)

## 2012-07-02 LAB — LIPID PANEL
Cholesterol: 204 mg/dL — ABNORMAL HIGH (ref 0–200)
HDL: 55.6 mg/dL (ref >39.00)
VLDL: 14 mg/dL (ref 0.0–40.0)

## 2012-07-02 LAB — BASIC METABOLIC PANEL
CO2: 30 mEq/L (ref 19–32)
Calcium: 8.9 mg/dL (ref 8.4–10.5)
Chloride: 106 mEq/L (ref 96–112)
Creatinine, Ser: 0.7 mg/dL (ref 0.4–1.2)
Glucose, Bld: 107 mg/dL — ABNORMAL HIGH (ref 70–99)

## 2012-07-02 LAB — LDL CHOLESTEROL, DIRECT: Direct LDL: 137.3 mg/dL

## 2012-07-03 ENCOUNTER — Other Ambulatory Visit: Payer: Self-pay | Admitting: Internal Medicine

## 2012-07-07 ENCOUNTER — Ambulatory Visit (INDEPENDENT_AMBULATORY_CARE_PROVIDER_SITE_OTHER): Payer: BC Managed Care – PPO | Admitting: Internal Medicine

## 2012-07-07 ENCOUNTER — Encounter: Payer: Self-pay | Admitting: Internal Medicine

## 2012-07-07 VITALS — BP 132/80 | HR 84 | Temp 98.1°F | Ht 64.0 in | Wt 136.0 lb

## 2012-07-07 DIAGNOSIS — Z23 Encounter for immunization: Secondary | ICD-10-CM

## 2012-07-07 DIAGNOSIS — M949 Disorder of cartilage, unspecified: Secondary | ICD-10-CM

## 2012-07-07 DIAGNOSIS — Z0000 Encounter for general adult medical examination without abnormal findings: Secondary | ICD-10-CM

## 2012-07-07 DIAGNOSIS — R7301 Impaired fasting glucose: Secondary | ICD-10-CM

## 2012-07-07 DIAGNOSIS — M8580 Other specified disorders of bone density and structure, unspecified site: Secondary | ICD-10-CM

## 2012-07-07 DIAGNOSIS — L989 Disorder of the skin and subcutaneous tissue, unspecified: Secondary | ICD-10-CM | POA: Insufficient documentation

## 2012-07-07 DIAGNOSIS — M899 Disorder of bone, unspecified: Secondary | ICD-10-CM

## 2012-07-07 DIAGNOSIS — E782 Mixed hyperlipidemia: Secondary | ICD-10-CM

## 2012-07-07 MED ORDER — NIACIN ER (ANTIHYPERLIPIDEMIC) 500 MG PO TBCR: EXTENDED_RELEASE_TABLET | ORAL | Status: DC

## 2012-07-07 NOTE — Progress Notes (Signed)
Chief Complaint  Patient presents with  . Annual Exam    Needs a pneumo.  Has appt tomorrow with Dr. Loreta Ave and will schedule colonoscopy.    HPI: Patient comes in today for Preventive Medicare wellness visit . No major injuries, ed visits ,hospitalizations , new medications since last visit. Lots of stress but getting better  Still chair of dept and busy  Sits a lot. Area on nose still there ? Should see her derm. No bleeding but persists.  Both parents long lived 90s   Hearing:  Ok   Vision:  No limitations at present . Last eye check UTD  Safety:  Has smoke detector and wears seat belts.  No firearms. No excess sun exposure. Sees dentist regularly.  Falls:  No   Advance directive :  Reviewed    Memory: Felt to be good  , no concern   Depression: No anhedonia unusual crying or depressive symptoms  Nutrition: Eats well balanced diet; adequate calcium and vitamin D. No swallowing chewing problems.  Injury: no major injuries in the last six months.  Other healthcare providers:  Reviewed today .  Social:   HH of 1 no pets  Dept chair soc  [employer] No pets.   Preventive parameters: up-to-date  Reviewed   ADLS:   There are no problems or need for assistance  driving, feeding, obtaining food, dressing, toileting and bathing, managing money using phone. She is independent.  EXERCISE/ HABITS  Per week   No tobacco    etoh   ROS:  GEN/ HEENT: No fever, significant weight changes sweats headaches vision problems hearing changes, CV/ PULM; No chest pain shortness of breath cough, syncope,edema  change in exercise tolerance. GI /GU: No adominal pain, vomiting, change in bowel habits. No blood in the stool. No significant GU symptoms. SKIN/HEME: ,no acute skin rashes suspicious lesions or bleeding. No lymphadenopathy, nodules, masses.  NEURO/ PSYCH:  No neurologic signs such as weakness numbness. No depression anxiety. IMM/ Allergy: No unusual infections.  Allergy .   REST of 12  system review negative except as per HPI   Past Medical History  Diagnosis Date  . High triglycerides   . Hx of varicella     Family History  Problem Relation Age of Onset  . Hypertension Mother   . Hyperlipidemia Mother   . Diabetes Father   . Miscarriages / [place] Brother     History   Social History  . Marital Status: Divorced    Spouse Name: N/A    Number of Children: N/A  . 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 Smoking status: Former Games developer  . Smokeless tobacco: None  . Alcohol Use: 0.5 oz/week    1 drink(s) per week  . Drug Use: No  . Sexually Active: None   Other Topics Concern  . None   Social History Narrative    Now hhof 1 no pets    .    Mother  Moved down to friends home        Divorced one chid and grandchild   Neg ets firearms.    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       Exercises regularly      Neg tad     Outpatient Encounter Prescriptions as of 07/07/2012  Medication Sig Dispense Refill  . cholecalciferol (VITAMIN D)  1000 UNITS tablet Take 1,000 Units by mouth daily.      . cycloSPORINE (RESTASIS) 0.05 % ophthalmic emulsion 1 drop 2 (two) times daily. Hebert Soho, MD gives this to pt.      . fluticasone (FLONASE) 50 MCG/ACT nasal spray PLACE 2 SPRAYS INTO THE NOSE DAILY.  16 g  3  . MULTIPLE VITAMIN PO Take by mouth.        . niacin (NIASPAN) 500 MG CR tablet TAKE 2 TABLET AT BEDTIME  60 tablet  11  . Omega-3 Fatty Acids (OMEGA 3 PO) Take by mouth.        . [DISCONTINUED] niacin (NIASPAN) 500 MG CR tablet TAKE 1 TABLET AT BEDTIME  30 tablet  0  . [DISCONTINUED] calcium citrate (CALCITRATE - DOSED IN MG ELEMENTAL CALCIUM) 950 MG tablet Take 1 tablet by mouth daily.         No facility-administered encounter medications on file as of 07/07/2012.    EXAM:  BP 132/80  Pulse 84  Temp(Src) 98.1 F (36.7 C) (Oral)  Ht 5\' 4"  (1.626 m)  Wt 136 lb  (61.689 kg)  BMI 23.33 kg/m2  SpO2 98%  Body mass index is 23.33 kg/(m^2).  Physical Exam: Vital signs reviewed ZOX:WRUE is a well-developed well-nourished alert cooperative   who appears stated age in no acute distress.  HEENT: normocephalic atraumatic , Eyes: PERRL EOM's full, conjunctiva clear, glasses  Nares: paten,t no deformity discharge or tenderness., Ears: no deformity EAC's clear TMs with normal landmarks. Mouth: clear OP, no lesions, edema.  Moist mucous membranes. Dentition in adequate repair. NECK: supple without masses, thyromegaly or bruits. CHEST/PULM:  Clear to auscultation and percussion breath sounds equal no wheeze , rales or rhonchi. No chest wall deformities or tenderness. Breast: normal by inspection . No dimpling, discharge, masses, tenderness or discharge . CV: PMI is nondisplaced, S1 S2 no gallops, murmurs, rubs. Peripheral pulses are full without delay.No JVD .  ABDOMEN: Bowel sounds normal nontender  No guard or rebound, no hepato splenomegal no CVA tenderness.  No hernia. Extremtities:  No clubbing cyanosis or edema, no acute joint swelling or redness no focal atrophy NEURO:  Oriented x3, cranial nerves 3-12 appear to be intact, no obvious focal weakness,gait within normal limits no abnormal reflexes or asymmetrical SKIN: No acute rashes normal turgor, color, no bruising or petechiae.  Nose 1-2 mm red spots some scale and irreg  PSYCH: Oriented, good eye contact, no obvious depression anxiety, cognition and judgment appear normal. LN: no cervical axillary inguinal adenopathy No noted deficits in memory, attention, and speech.   Lab Results  Component Value Date   WBC 4.3* 07/02/2012   HGB 12.7 07/02/2012   HCT 37.4 07/02/2012   PLT 266.0 07/02/2012   GLUCOSE 107* 07/02/2012   CHOL 204* 07/02/2012   TRIG 70.0 07/02/2012   HDL 55.60 07/02/2012   LDLDIRECT 137.3 07/02/2012   LDLCALC 111* 06/29/2011   ALT 20 07/02/2012   AST 21 07/02/2012   NA 139 07/02/2012   K 4.1  07/02/2012   CL 106 07/02/2012   CREATININE 0.7 07/02/2012   BUN 14 07/02/2012   CO2 30 07/02/2012   TSH 1.11 07/02/2012   HGBA1C 6.2 11/07/2010    ASSESSMENT AND PLAN:  Discussed the following assessment and plan:  Preventative health care - disc prevnar 13 to give today can give PCV 23 at other time to get colon this week  Osteopenia - may be due for dexa soon   Hyperlipidemia,  mixed - slight deterioration  no se of 500 niaspan  trial of inc to 1000 or alternating as tolerated ; ck labs 2-3 months   Impaired fasting blood sugar - intensi LSI   Need for prophylactic vaccination against Streptococcus pneumoniae (pneumococcus) - Plan: Pneumococcal conjugate vaccine 13-valent less than 5yo IM  Skin lesion nose - persiste ? if AK vs other  see derm   Patient Care Team: Madelin Headings, MD as PCP - General (Internal Medicine) [facility] Bjorn Loser, MD as Attending Physician (Dermatology) Elliot Cousin [facility])  Patient Instructions  Trial increase niaspan to 1000 mg hs  And check lipids in about 3 months or as needed.    Avoid simple sugars as discussed to prevent diabetes.  Intensify lifestyle interventions. Activity  Etc.  Plan labs   In 3-4 months    Do not need ov unless  Wanted. Get derm to check the nose .  prevnar 13 today  Plan bone density next year or  When you wish usually do a screening dexa at about age 72 .    [name]. Panosh M.D.  Health Maintenance  Topic Date Due  . Colonoscopy  04/24/2011  . Pneumococcal Polysaccharide Vaccine Age 47 And Over  12/28/2011  . Influenza Vaccine  12/22/2012  . Mammogram  12/20/2013  . Tetanus/tdap  03/18/2016  . Zostavax  Completed   Health Maintenance Review

## 2012-07-07 NOTE — Patient Instructions (Addendum)
Trial increase niaspan to 1000 mg hs  And check lipids in about 3 months or as needed.    Avoid simple sugars as discussed to prevent diabetes.  Intensify lifestyle interventions. Activity  Etc.  Plan labs   In 3-4 months    Do not need ov unless  Wanted. Get derm to check the nose .  prevnar 13 today  Plan bone density next year or  When you wish usually do a screening dexa at about age 67 .

## 2012-07-24 ENCOUNTER — Other Ambulatory Visit: Payer: Self-pay | Admitting: Dermatology

## 2012-07-24 ENCOUNTER — Other Ambulatory Visit: Payer: Self-pay | Admitting: Internal Medicine

## 2012-09-12 ENCOUNTER — Encounter: Payer: Self-pay | Admitting: Internal Medicine

## 2012-09-12 LAB — HM COLONOSCOPY

## 2012-11-26 ENCOUNTER — Other Ambulatory Visit: Payer: Self-pay

## 2012-12-20 ENCOUNTER — Other Ambulatory Visit: Payer: Self-pay | Admitting: Internal Medicine

## 2012-12-25 ENCOUNTER — Other Ambulatory Visit: Payer: Self-pay | Admitting: Internal Medicine

## 2013-02-26 ENCOUNTER — Other Ambulatory Visit: Payer: Self-pay

## 2013-05-07 LAB — HM MAMMOGRAPHY

## 2013-05-08 ENCOUNTER — Encounter: Payer: Self-pay | Admitting: Internal Medicine

## 2013-05-28 ENCOUNTER — Encounter: Payer: Self-pay | Admitting: Family Medicine

## 2013-05-28 ENCOUNTER — Ambulatory Visit (INDEPENDENT_AMBULATORY_CARE_PROVIDER_SITE_OTHER): Payer: BC Managed Care – PPO | Admitting: Family Medicine

## 2013-05-28 VITALS — BP 130/78 | HR 91 | Temp 99.5°F | Wt 137.0 lb

## 2013-05-28 DIAGNOSIS — R062 Wheezing: Secondary | ICD-10-CM

## 2013-05-28 DIAGNOSIS — R059 Cough, unspecified: Secondary | ICD-10-CM

## 2013-05-28 DIAGNOSIS — R05 Cough: Secondary | ICD-10-CM

## 2013-05-28 MED ORDER — AZITHROMYCIN 250 MG PO TABS: ORAL_TABLET | ORAL | Status: AC

## 2013-05-28 MED ORDER — FLUCONAZOLE 150 MG PO TABS: 150.0000 mg | ORAL_TABLET | Freq: Once | ORAL | Status: DC

## 2013-05-28 MED ORDER — METHYLPREDNISOLONE ACETATE 80 MG/ML IJ SUSP
80.0000 mg | Freq: Once | INTRAMUSCULAR | Status: AC
  Administered 2013-05-28: 80 mg via INTRAMUSCULAR

## 2013-05-28 NOTE — Progress Notes (Signed)
Pre visit review using our clinic review tool, if applicable. No additional management support is needed unless otherwise documented below in the visit note. 

## 2013-05-28 NOTE — Patient Instructions (Signed)
Follow up promptly for any increased shortness of breath or any persistent fever.

## 2013-05-28 NOTE — Progress Notes (Signed)
   Subjective:    Patient ID: Tina [name], female    DOB: 09/04/1945, 68 y.o.   MRN: [id number]  Cough Associated symptoms include chills, a fever and wheezing. Pertinent negatives include no chest pain, sore throat or shortness of breath.   Acute visit for cough and low-grade fever. Patient [place] from [place] She actually developed some cough about 2 weeks ago. She returned this past Saturday by Sunday had temperature up to 100.5 and off and on since then. She describes increased fatigue, productive cough, maxillary and frontal sinus pressure. Denies any body aches. No nausea or vomiting. No significant sore throat. Nonsmoker. Has had some wheezing off and on. No history of asthma. Drinking fluids well. Has tried over-the-counter guaifenesin without much improvement. No diarrhea  Past Medical History  Diagnosis Date  . High triglycerides   . Hx of varicella    Past Surgical History  Procedure Laterality Date  . Cholecystectomy      reports that she has quit smoking. She does not have any smokeless tobacco history on file. She reports that she drinks about 0.5 ounces of alcohol per week. She reports that she does not use illicit drugs. family history includes Diabetes in her father; Hyperlipidemia in her mother; Hypertension in her mother; Miscarriages / [place] in her brother. No Known Allergies      Review of Systems  Constitutional: Positive for fever, chills and fatigue.  HENT: Negative for congestion and sore throat.   Respiratory: Positive for cough and wheezing. Negative for shortness of breath.   Cardiovascular: Negative for chest pain.  Neurological: Negative for dizziness.       Objective:   Physical Exam  Constitutional: She appears well-developed and well-nourished.  HENT:  Right Ear: External ear normal.  Left Ear: External ear normal.  Mouth/Throat: Oropharynx is clear and moist.  Neck: Neck supple.  Cardiovascular: Normal rate.     Pulmonary/Chest: Effort normal. She has wheezes.  Lymphadenopathy:    She has no cervical adenopathy.          Assessment & Plan:  Patient presents with febrile illness with cough and wheezing. We could not detect any clear rales but she's had fever now for almost 5 days. She is also describing some bilateral facial pain and may have acute sinusitis. We've recommended Zithromax for 5 days. Depo-Medrol 80 mg IM given. Followup promptly for any shortness of breath or if fevers not resolving over the next couple days. We offered albuterol inhaler and she declines.

## 2013-07-14 ENCOUNTER — Other Ambulatory Visit (INDEPENDENT_AMBULATORY_CARE_PROVIDER_SITE_OTHER): Payer: BC Managed Care – PPO

## 2013-07-14 DIAGNOSIS — Z0000 Encounter for general adult medical examination without abnormal findings: Secondary | ICD-10-CM

## 2013-07-14 LAB — LIPID PANEL
CHOL/HDL RATIO: 2
Cholesterol: 196 mg/dL (ref 0–200)
HDL: 81.8 mg/dL (ref >39.00)
LDL CALC: 103 mg/dL — AB (ref 0–99)
TRIGLYCERIDES: 58 mg/dL (ref 0.0–149.0)
VLDL: 11.6 mg/dL (ref 0.0–40.0)

## 2013-07-14 LAB — HEPATIC FUNCTION PANEL
ALBUMIN: 4 g/dL (ref 3.5–5.2)
ALT: 29 U/L (ref 0–35)
AST: 26 U/L (ref 0–37)
Alkaline Phosphatase: 60 U/L (ref 39–117)
BILIRUBIN DIRECT: 0.1 mg/dL (ref 0.0–0.3)
TOTAL PROTEIN: 6.8 g/dL (ref 6.0–8.3)
Total Bilirubin: 0.7 mg/dL (ref 0.3–1.2)

## 2013-07-14 LAB — CBC WITH DIFFERENTIAL/PLATELET
BASOS PCT: 0.5 % (ref 0.0–3.0)
Basophils Absolute: 0 10*3/uL (ref 0.0–0.1)
EOS PCT: 1.8 % (ref 0.0–5.0)
Eosinophils Absolute: 0.1 10*3/uL (ref 0.0–0.7)
HEMATOCRIT: 41.6 % (ref 36.0–46.0)
Hemoglobin: 13.6 g/dL (ref 12.0–15.0)
LYMPHS ABS: 1.6 10*3/uL (ref 0.7–4.0)
Lymphocytes Relative: 27.9 % (ref 12.0–46.0)
MCHC: 32.8 g/dL (ref 30.0–36.0)
MCV: 88.6 fl (ref 78.0–100.0)
MONO ABS: 0.3 10*3/uL (ref 0.1–1.0)
MONOS PCT: 6.1 % (ref 3.0–12.0)
NEUTROS PCT: 63.7 % (ref 43.0–77.0)
Neutro Abs: 3.6 10*3/uL (ref 1.4–7.7)
Platelets: 293 10*3/uL (ref 150.0–400.0)
RBC: 4.69 Mil/uL (ref 3.87–5.11)
RDW: 15.3 % — ABNORMAL HIGH (ref 11.5–14.6)
WBC: 5.7 10*3/uL (ref 4.5–10.5)

## 2013-07-14 LAB — BASIC METABOLIC PANEL
BUN: 18 mg/dL (ref 6–23)
CHLORIDE: 101 meq/L (ref 96–112)
CO2: 27 mEq/L (ref 19–32)
Calcium: 9 mg/dL (ref 8.4–10.5)
Creatinine, Ser: 0.7 mg/dL (ref 0.4–1.2)
GFR: 83.32 mL/min (ref >60.00)
Glucose, Bld: 136 mg/dL — ABNORMAL HIGH (ref 70–99)
POTASSIUM: 3.8 meq/L (ref 3.5–5.1)
Sodium: 135 mEq/L (ref 135–145)

## 2013-07-14 LAB — TSH: TSH: 1.77 u[IU]/mL (ref 0.35–5.50)

## 2013-07-21 ENCOUNTER — Encounter: Payer: Self-pay | Admitting: Internal Medicine

## 2013-07-21 ENCOUNTER — Ambulatory Visit (INDEPENDENT_AMBULATORY_CARE_PROVIDER_SITE_OTHER): Payer: BC Managed Care – PPO | Admitting: Internal Medicine

## 2013-07-21 VITALS — BP 112/72 | HR 87 | Temp 98.2°F | Ht 64.0 in | Wt 133.0 lb

## 2013-07-21 DIAGNOSIS — M949 Disorder of cartilage, unspecified: Secondary | ICD-10-CM

## 2013-07-21 DIAGNOSIS — M8580 Other specified disorders of bone density and structure, unspecified site: Secondary | ICD-10-CM

## 2013-07-21 DIAGNOSIS — R7301 Impaired fasting glucose: Secondary | ICD-10-CM

## 2013-07-21 DIAGNOSIS — J302 Other seasonal allergic rhinitis: Secondary | ICD-10-CM

## 2013-07-21 DIAGNOSIS — M899 Disorder of bone, unspecified: Secondary | ICD-10-CM

## 2013-07-21 DIAGNOSIS — E782 Mixed hyperlipidemia: Secondary | ICD-10-CM

## 2013-07-21 DIAGNOSIS — Z833 Family history of diabetes mellitus: Secondary | ICD-10-CM

## 2013-07-21 DIAGNOSIS — Z0000 Encounter for general adult medical examination without abnormal findings: Secondary | ICD-10-CM

## 2013-07-21 DIAGNOSIS — J309 Allergic rhinitis, unspecified: Secondary | ICD-10-CM

## 2013-07-21 DIAGNOSIS — Z23 Encounter for immunization: Secondary | ICD-10-CM

## 2013-07-21 NOTE — Patient Instructions (Addendum)
Continue lifestyle intervention healthy eating and exercise . Decrease the niaspan to 1 poer day as we discussed ( 500) Plan hg a1c  And fu Fasting blood sugar  In about a month .   Arrange time for bone density   At [facility] office.  ROV in month after labs and will reevaluate plan

## 2013-07-21 NOTE — Progress Notes (Signed)
Chief Complaint  Patient presents with  . Annual Exam    HPI: Patient comes in today for Preventive Health Care visit  No major change in health status since last visit . Was disappointed and worried about her elevated fasting blood sugar that she saw on my chart. Higher dose of med Niaspan caused side effect.  Then  Tried again and did better . It back down on the Niaspan after she saw the blood sugar Family hx of dm.  Eating pretty healthy  And trying to avoid dm. . Exercise up and down   Lost pounds for this .  Mom 99   Living  And father got dm in 5s .  Health Maintenance  Topic Date Due  . Pneumococcal Polysaccharide Vaccine Age 30 And Over  12/28/2011  . Influenza Vaccine  11/21/2013  . Mammogram  05/08/2015  . Tetanus/tdap  03/18/2016  . Colonoscopy  09/13/2022  . Zostavax  Completed   Health Maintenance Review  ROS:  Intermittent foot pain with increasing walkin issue of same in past better with inserts but right foot bothering her and limiting at times  GEN/ HEENT: No fever, significant weight changes sweats headaches vision problems hearing changes, CV/ PULM; No chest pain shortness of breath cough, syncope,edema  change in exercise tolerance. GI /GU: No adominal pain, vomiting, change in bowel habits. No blood in the stool. No significant GU symptoms. SKIN/HEME: ,no acute skin rashes suspicious lesions or bleeding. No lymphadenopathy, nodules, masses.  NEURO/ PSYCH:  No neurologic signs such as weakness numbness. No depression anxiety. IMM/ Allergy: No unusual infections.  Allergy .   No changeDoes have foot pain when she tries to exercise long walking history of orthotics no claudication REST of 12 system review negative except as per HPI   Past Medical History  Diagnosis Date  . High triglycerides   . Hx of varicella     Family History  Problem Relation Age of Onset  . Hypertension Mother   . Hyperlipidemia Mother   . Diabetes Father   . Miscarriages /  [place] Brother     History   Social History  . Marital Status: Divorced    Spouse Name: N/A    Number of Children: N/A  . 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 Smoking status: Former Games developer  . Smokeless tobacco: None  . Alcohol Use: 0.5 oz/week    1 drink(s) per week  . Drug Use: No  . Sexual Activity: None   Other Topics Concern  . None   Social History Narrative    Now hhof 1 no pets    .    Mother  Moved down to friends home        Divorced one chid and grandchild   Neg ets firearms.    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    Exercises regularly      Neg tad     Outpatient Encounter Prescriptions as of 07/21/2013  Medication Sig  . cycloSPORINE (RESTASIS) 0.05 % ophthalmic emulsion 1 drop 2 (two) times daily. Hebert Soho, MD gives this to pt.  . fluticasone (FLONASE) 50 MCG/ACT nasal spray PLACE 2 SPRAYS INTO THE NOSE DAILY  . MULTIPLE VITAMIN PO Take by mouth.    . niacin (NIASPAN) 500 MG CR tablet  Take 500 mg by mouth at bedtime.  . Omega-3 Fatty Acids (OMEGA 3 PO) Take by mouth.    . [DISCONTINUED] niacin (NIASPAN) 500 MG CR tablet TAKE 2 TABLET AT BEDTIME  . [DISCONTINUED] cholecalciferol (VITAMIN D) 1000 UNITS tablet Take 1,000 Units by mouth daily.  . [DISCONTINUED] fluconazole (DIFLUCAN) 150 MG tablet Take 1 tablet (150 mg total) by mouth once.    EXAM:  BP 112/72  Pulse 87  Temp(Src) 98.2 F (36.8 C) (Oral)  Ht 5\' 4"  (1.626 m)  Wt 133 lb (60.328 kg)  BMI 22.82 kg/m2  SpO2 98%  Body mass index is 22.82 kg/(m^2).  Physical Exam: Vital signs reviewed [id number]:This is a well-developed well-nourished alert cooperative    who appearsr stated age in no acute distress.  HEENT: normocephalic atraumatic , Eyes: PERRL EOM's full, glasses  conjunctiva clear, Nares: paten,t no deformity discharge or tenderness., Ears: no deformity EAC's  clear TMs with normal landmarks. Mouth: clear OP, no lesions, edema.  Moist mucous membranes. Dentition in adequate repair. NECK: supple without masses, thyromegaly or bruits. CHEST/PULM:  Clear to auscultation and percussion breath sounds equal no wheeze , rales or rhonchi. No chest wall deformities or tenderness. Breast: normal by inspection . No dimpling, discharge, masses, tenderness or discharge . CV: PMI is nondisplaced, S1 S2 no gallops, murmurs, rubs. Peripheral pulses are full without delay.No JVD .  ABDOMEN: Bowel sounds normal nontender  No guard or rebound, no hepato splenomegal no CVA tenderness.  No hernia. Extremtities:  No clubbing cyanosis or edema, no acute joint swelling or redness no focal atrophy NEURO:  Oriented x3, cranial nerves 3-12 appear to be intact, no obvious focal weakness,gait within normal limits no abnormal reflexes or asymmetrical SKIN: No acute rashes normal turgor, color, no bruising or petechiae. PSYCH: Oriented, good eye contact, no obvious depression anxiety, cognition and judgment appear normal. LN: no cervical axillary inguinal adenopathy  Lab Results  Component Value Date   WBC 5.7 07/14/2013   HGB 13.6 07/14/2013   HCT 41.6 07/14/2013   PLT 293.0 07/14/2013   GLUCOSE 136* 07/14/2013   CHOL 196 07/14/2013   TRIG 58.0 07/14/2013   HDL 81.80 07/14/2013   LDLDIRECT 137.3 07/02/2012   LDLCALC 103* 07/14/2013   ALT 29 07/14/2013   AST 26 07/14/2013   NA 135 07/14/2013   K 3.8 07/14/2013   CL 101 07/14/2013   CREATININE 0.7 07/14/2013   BUN 18 07/14/2013   CO2 27 07/14/2013   TSH 1.77 07/14/2013   HGBA1C 6.2 11/07/2010    ASSESSMENT AND PLAN:  Discussed the following assessment and plan:  Visit for preventive health examination - pneumovax 23 rest utd   Hyperlipidemia, mixed - great numbers dec niapsan in case adding to glucose intolerance   Impaired fasting blood sugar - up in dm range fam hx dec niastpan has healthy eating and exercise   Osteopenia -  due for dexa redcheck  - Plan: DG Bone Density  Allergic rhinitis, seasonal - no sig change   Need for 23-polyvalent pneumococcal polysaccharide vaccine - Plan: Pneumococcal polysaccharide vaccine 23-valent greater than or equal to 2yo subcutaneous/IM  Family history of diabetes mellitus ocass foot pain  Can see sm if needed  Patient Care Team: Madelin HeadingsWanda K Naiara Lombardozzi, MD as PCP - General (Internal Medicine) Tamera ReasonHope Marsha Gruber, MD as Attending Physician (Dermatology) Elliot Cousinharles Byrnes (Optometry) Patient Instructions  Continue lifestyle intervention healthy eating and exercise . Decrease the niaspan to 1 poer day as we discussed ( 500)  Plan hg a1c  And fu Fasting blood sugar  In about a month .   Arrange time for bone density   At [facility].  ROV in month after labs and will reevaluate plan     [name]. Camden Mazzaferro M.D.  Pre visit review using our clinic review tool, if applicable. No additional management support is needed unless otherwise documented below in the visit note.

## 2013-07-22 ENCOUNTER — Ambulatory Visit (INDEPENDENT_AMBULATORY_CARE_PROVIDER_SITE_OTHER)
Admission: RE | Admit: 2013-07-22 | Discharge: 2013-07-22 | Disposition: A | Discharge status: Home / Self Care | Payer: BC Managed Care – PPO | Source: Ambulatory Visit | Attending: Internal Medicine | Admitting: Internal Medicine

## 2013-07-22 DIAGNOSIS — M899 Disorder of bone, unspecified: Secondary | ICD-10-CM

## 2013-07-22 DIAGNOSIS — M8580 Other specified disorders of bone density and structure, unspecified site: Secondary | ICD-10-CM

## 2013-07-22 DIAGNOSIS — M949 Disorder of cartilage, unspecified: Secondary | ICD-10-CM

## 2013-07-27 ENCOUNTER — Encounter: Payer: Self-pay | Admitting: Internal Medicine

## 2013-08-06 ENCOUNTER — Other Ambulatory Visit: Payer: Self-pay | Admitting: Internal Medicine

## 2013-08-19 ENCOUNTER — Other Ambulatory Visit (INDEPENDENT_AMBULATORY_CARE_PROVIDER_SITE_OTHER): Payer: BC Managed Care – PPO

## 2013-08-19 DIAGNOSIS — Z833 Family history of diabetes mellitus: Secondary | ICD-10-CM

## 2013-08-19 LAB — POCT CBG (FASTING - GLUCOSE)-MANUAL ENTRY: Glucose Fasting, POC: 142 mg/dL — AB (ref 70–99)

## 2013-08-19 LAB — HEMOGLOBIN A1C: Hgb A1c MFr Bld: 5.5 % (ref 4.6–6.5)

## 2013-08-25 ENCOUNTER — Ambulatory Visit (INDEPENDENT_AMBULATORY_CARE_PROVIDER_SITE_OTHER): Payer: BC Managed Care – PPO | Admitting: Internal Medicine

## 2013-08-25 ENCOUNTER — Encounter: Payer: Self-pay | Admitting: Internal Medicine

## 2013-08-25 VITALS — BP 146/84 | Temp 98.3°F | Ht 64.0 in | Wt 128.0 lb

## 2013-08-25 DIAGNOSIS — E782 Mixed hyperlipidemia: Secondary | ICD-10-CM

## 2013-08-25 DIAGNOSIS — L989 Disorder of the skin and subcutaneous tissue, unspecified: Secondary | ICD-10-CM

## 2013-08-25 DIAGNOSIS — R7301 Impaired fasting glucose: Secondary | ICD-10-CM

## 2013-08-25 LAB — GLUCOSE, POCT (MANUAL RESULT ENTRY): POC Glucose: 109 mg/dl — AB (ref 70–99)

## 2013-08-25 NOTE — Patient Instructions (Addendum)
Continue lifestyle intervention healthy eating and exercise . As you are doing . Consider metformin but i think you may do well with lifestyle intervention healthy eating and exercise .  Plan  Fu hg a1c and bmp  Lipids in 3-4  months and fu.

## 2013-08-25 NOTE — Progress Notes (Signed)
Chief Complaint  Patient presents with  . Follow-up    glucose    HPI: Fu of bg  Elevation Is on lower dose of niaspan  After had elevaetd fbgf last week began reevaluating diet  Planning lower carb.  And losing. Weight about a week ago. Feels better actually losing weight  Hd 1/2 glass wine night before  Father had Dm  ROS: See pertinent positives and negatives per HPI. Left chest mole dark area ? Changing had serm check in fall  Hx of abn lesion on face removed   Past Medical History  Diagnosis Date  . High triglycerides   . Hx of varicella     Family History  Problem Relation Age of Onset  . Hypertension Mother   . Hyperlipidemia Mother   . Diabetes Father   . Miscarriages / [place]     History   Social History  . Marital Status: Divorced    Spouse Name: N/A    Number of Children: N/A  . 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 Smoking status: Former Games developermoker  . Smokeless tobacco: None  . Alcohol Use: 0.5 oz/week    1 drink(s) per week  . Drug Use: No  . Sexual Activity: None   Other Topics Concern  . None   Social History Narrative    Now hhof 1 no pets    .    Mother  Moved down to friends home        Divorced one chid and grandchild   Neg ets firearms.    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      Educ PHD.    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    Exercises regularly      Neg tad     Outpatient Encounter Prescriptions as of 08/25/2013  Medication Sig  . cycloSPORINE (RESTASIS) 0.05 % ophthalmic emulsion 1 drop 2 (two) times daily. Hebert Sohoharles Burnes, MD gives this to pt.  . MULTIPLE VITAMIN PO Take by mouth.    . niacin (NIASPAN) 500 MG CR tablet Take 500 mg by mouth at bedtime.  . Omega-3 Fatty Acids (OMEGA 3 PO) Take by mouth.    . fluticasone (FLONASE) 50 MCG/ACT nasal spray PLACE 2 SPRAYS INTO THE NOSE DAILY  . [DISCONTINUED] niacin (NIASPAN) 500 MG CR tablet TAKE 2  TABLET AT BEDTIME    EXAM:  BP 146/84  Temp(Src) 98.3 F (36.8 C) (Oral)  Ht 5\' 4"  (1.626 m)  Wt 128 lb (58.06 kg)  BMI 21.96 kg/m2  Body mass index is 21.96 kg/(m^2).  GENERAL: vitals reviewed and listed above, alert, oriented, appears well hydrated and in no acute distress Left bra line are with 1.5 cm round dark ? sk vs mole  PSYCH: pleasant and cooperative, no obvious depression or anxiety Labs reviewed  Lab Results  Component Value Date   WBC 5.7 07/14/2013   HGB 13.6 07/14/2013   HCT 41.6 07/14/2013   PLT 293.0 07/14/2013   GLUCOSE 136* 07/14/2013   CHOL 196 07/14/2013   TRIG 58.0 07/14/2013   HDL 81.80 07/14/2013   LDLDIRECT 137.3 07/02/2012   LDLCALC 103* 07/14/2013   ALT 29 07/14/2013   AST 26 07/14/2013   NA 135 07/14/2013   K 3.8 07/14/2013   CL 101 07/14/2013   CREATININE 0.7 07/14/2013   BUN 18 07/14/2013   CO2 27 07/14/2013  TSH 1.77 07/14/2013   HGBA1C 5.5 08/19/2013   Wt Readings from Last 3 Encounters:  08/25/13 128 lb (58.06 kg)  07/21/13 133 lb (60.328 kg)  05/28/13 137 lb (62.143 kg)     ASSESSMENT AND PLAN:  Discussed the following assessment and plan:  Impaired fasting blood sugar - post prandial  109 today optinos disc cont lsi  - Plan: POC Glucose (CBG)  Hyperlipidemia, mixed  Skin lesion - I think this is a sk but uncertain have derm check this or get removed   -Patient advised to return or notify health care team  if symptoms worsen ,persist or new concerns arise.  Patient Instructions  Continue lifestyle intervention healthy eating and exercise . As you are doing . Consider metformin but i think you may do well with lifestyle intervention healthy eating and exercise .  Plan  Fu hg a1c and bmp  Lipids in 3-4  months and fu.  Total visit 25mins > 50% spent counseling and coordinating care   PlainedgeWanda K. Belkis [name] M.D.  Pre visit review using our clinic review tool, if applicable. No additional management support is needed unless otherwise  documented below in the visit note.

## 2013-10-14 ENCOUNTER — Other Ambulatory Visit: Payer: Self-pay | Admitting: Internal Medicine

## 2013-10-15 NOTE — Telephone Encounter (Signed)
Sent to the pharmacy by e-scribe. 

## 2013-12-30 ENCOUNTER — Other Ambulatory Visit (INDEPENDENT_AMBULATORY_CARE_PROVIDER_SITE_OTHER): Payer: BC Managed Care – PPO

## 2013-12-30 DIAGNOSIS — E119 Type 2 diabetes mellitus without complications: Secondary | ICD-10-CM

## 2013-12-30 LAB — LIPID PANEL
CHOL/HDL RATIO: 3
Cholesterol: 205 mg/dL — ABNORMAL HIGH (ref 0–200)
HDL: 75.6 mg/dL (ref >39.00)
LDL CALC: 114 mg/dL — AB (ref 0–99)
NONHDL: 129.4
TRIGLYCERIDES: 76 mg/dL (ref 0.0–149.0)
VLDL: 15.2 mg/dL (ref 0.0–40.0)

## 2013-12-30 LAB — BASIC METABOLIC PANEL
BUN: 15 mg/dL (ref 6–23)
CO2: 26 meq/L (ref 19–32)
CREATININE: 0.8 mg/dL (ref 0.4–1.2)
Calcium: 9.5 mg/dL (ref 8.4–10.5)
Chloride: 102 mEq/L (ref 96–112)
GFR: 73.9 mL/min (ref >60.00)
Glucose, Bld: 101 mg/dL — ABNORMAL HIGH (ref 70–99)
Potassium: 4.8 mEq/L (ref 3.5–5.1)
Sodium: 137 mEq/L (ref 135–145)

## 2013-12-30 LAB — MICROALBUMIN / CREATININE URINE RATIO
Creatinine,U: 146.5 mg/dL
Microalb Creat Ratio: 0.3 mg/g (ref 0.0–30.0)
Microalb, Ur: 0.5 mg/dL (ref 0.0–1.9)

## 2013-12-30 LAB — HEMOGLOBIN A1C: Hgb A1c MFr Bld: 5.9 % (ref 4.6–6.5)

## 2014-01-08 ENCOUNTER — Ambulatory Visit (INDEPENDENT_AMBULATORY_CARE_PROVIDER_SITE_OTHER): Payer: BC Managed Care – PPO | Admitting: Internal Medicine

## 2014-01-08 ENCOUNTER — Encounter: Payer: Self-pay | Admitting: Internal Medicine

## 2014-01-08 VITALS — BP 118/74 | Temp 98.4°F | Ht 64.0 in | Wt 123.0 lb

## 2014-01-08 DIAGNOSIS — Z833 Family history of diabetes mellitus: Secondary | ICD-10-CM

## 2014-01-08 DIAGNOSIS — R7301 Impaired fasting glucose: Secondary | ICD-10-CM

## 2014-01-08 DIAGNOSIS — E782 Mixed hyperlipidemia: Secondary | ICD-10-CM

## 2014-01-08 NOTE — Progress Notes (Signed)
Pre visit review using our clinic review tool, if applicable. No additional management support is needed unless otherwise documented below in the visit note.   Chief Complaint  Patient presents with  . Follow-up    HPI:  Tina [name] is 68 y.o. comes in for  Fu bg   Didn't take metformin and is doing lsi  And monitoring  Lose it dot com.  Feels well no viual neuro cv sx  taking onluy 500 niaspan. At night oacc hot flushes . Very motivated    fam hx of dm type 2 in father with complications ROS: See pertinent positives and negatives per HPI.  Past Medical History  Diagnosis Date  . High triglycerides   . Hx of varicella     Family History  Problem Relation Age of Onset  . Hypertension Mother   . Hyperlipidemia Mother   . Diabetes Father   . Miscarriages / [place] Brother     History   Social History  . Marital Status: Divorced    Spouse Name: N/A    Number of Children: N/A  . 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 Smoking status: Former Games developer  . Smokeless tobacco: None  . Alcohol Use: 0.5 oz/week    1 drink(s) per week  . Drug Use: No  . Sexual Activity: None   Other Topics Concern  . None   Social History Narrative    Now hhof 1 no pets    .    Mother  Moved down to friends home        Divorced one chid and grandchild   Neg ets firearms.    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    Exercises regularly      Neg tad     Outpatient Encounter Prescriptions as of 01/08/2014  Medication Sig  . cycloSPORINE (RESTASIS) 0.05 % ophthalmic emulsion 1 drop 2 (two) times daily. Hebert Soho, MD gives this to pt.  . MULTIPLE VITAMIN PO Take by mouth.    . niacin (NIASPAN) 500 MG CR tablet Take 500 mg by mouth at bedtime.  . Omega-3 Fatty Acids (OMEGA 3 PO) Take by mouth.    . [DISCONTINUED] niacin (NIASPAN) 500 MG CR tablet TAKE 2 TABLET AT  BEDTIME  . fluticasone (FLONASE) 50 MCG/ACT nasal spray PLACE 2 SPRAYS INTO THE NOSE DAILY    EXAM:  BP 118/74  Temp(Src) 98.4 F (36.9 C) (Oral)  Ht  (1.626 m)  Wt 123 lb (55.792 kg)  BMI 21.10 kg/m2  Body mass index is 21.1 kg/(m^2).  GENERAL: vitals reviewed and listed above, alert, oriented, appears well hydrated and in no acute distress HEENT: atraumatic, conjunctiva  clear, no obvious abnormalities on inspection of external nose and ears  NECK: no obvious masses on inspection palpation  LUNGS: clear to auscultation bilaterally, no wheezes, rales or rhonchi, good air movement CV: HRRR, no clubbing cyanosis or  peripheral edema nl cap refill  MS: moves all extremities without noticeable focal  Abnormality  pulses good no calluses no sensation deficit   PSYCH: pleasant and cooperative, no obvious depression or anxiety Lab Results  Component Value Date   WBC 5.7 07/14/2013   HGB 13.6 07/14/2013   HCT 41.6 07/14/2013   PLT 293.0 07/14/2013   GLUCOSE 101* 12/30/2013  CHOL 205* 12/30/2013   TRIG 76.0 12/30/2013   HDL 75.60 12/30/2013   LDLDIRECT 137.3 07/02/2012   LDLCALC 114* 12/30/2013   ALT 29 07/14/2013   AST 26 07/14/2013   NA 137 12/30/2013   K 4.8 12/30/2013   CL 102 12/30/2013   CREATININE 0.8 12/30/2013   BUN 15 12/30/2013   CO2 26 12/30/2013   TSH 1.77 07/14/2013   HGBA1C 5.9 12/30/2013   MICROALBUR 0.5 12/30/2013    ASSESSMENT AND PLAN:  Discussed the following assessment and plan:  Impaired fasting blood sugar - excellent  response to lifes style,counseled to continue and monitor   Hyperlipidemia, mixed - She could consider trial off med if wishes age is only risk factor   or not fu at cpx in 6 months  Family history of diabetes mellitus consdier  -Patient advised to return or notify health care team  if symptoms worsen ,persist or new concerns arise.  Patient Instructions  Your numbers are very good. Ok  To decrease or even stop.niacin  If you want . Preventive  Visit  in 6 months  With lab and hga1c . Continue   Healthy diet  Mediterranean   As  You are doing .  Continue lifestyle intervention healthy eating and exercise .   [name]. Panosh M.D.

## 2014-01-08 NOTE — Patient Instructions (Signed)
Your numbers are very good. Ok  To decrease or even stop.niacin  If you want . Preventive  Visit in 6 months  With lab and hga1c . Continue   Healthy diet  Mediterranean   As  You are doing .  Continue lifestyle intervention healthy eating and exercise .

## 2014-02-08 ENCOUNTER — Other Ambulatory Visit: Payer: Self-pay | Admitting: Dermatology

## 2014-05-17 ENCOUNTER — Ambulatory Visit (INDEPENDENT_AMBULATORY_CARE_PROVIDER_SITE_OTHER): Payer: BC Managed Care – PPO | Admitting: Internal Medicine

## 2014-05-17 ENCOUNTER — Encounter: Payer: Self-pay | Admitting: Internal Medicine

## 2014-05-17 VITALS — BP 130/80 | Temp 98.4°F | Ht 64.0 in | Wt 121.7 lb

## 2014-05-17 DIAGNOSIS — H109 Unspecified conjunctivitis: Secondary | ICD-10-CM

## 2014-05-17 MED ORDER — POLYMYXIN B-TRIMETHOPRIM 10000-0.1 UNIT/ML-% OP SOLN: 1.0000 [drp] | OPHTHALMIC | Status: DC

## 2014-05-17 NOTE — Patient Instructions (Signed)
Warm compresses and antibiotic  Drops for about 7- 10 days . If not getting better contact us consider different drop and  seeing eye doc .  Conjunctivitis Conjunctivitis is commonly called "pink eye." Conjunctivitis can be caused by bacterial or viral infection, allergies, or injuries. There is usually redness of the lining of the eye, itching, discomfort, and sometimes discharge. There may be deposits of matter along the eyelids. A viral infection usually causes a watery discharge, while a bacterial infection causes a yellowish, thick discharge. Pink eye is very contagious and spreads by direct contact. You may be given antibiotic eyedrops as part of your treatment. Before using your eye medicine, remove all drainage from the eye by washing gently with warm water and cotton balls. Continue to use the medication until you have awakened 2 mornings in a row without discharge from the eye. Do not rub your eye. This increases the irritation and helps spread infection. Use separate towels from other household members. Wash your hands with soap and water before and after touching your eyes. Use cold compresses to reduce pain and sunglasses to relieve irritation from light. Do not wear contact lenses or wear eye makeup until the infection is gone. SEEK MEDICAL CARE IF:   Your symptoms are not better after 3 days of treatment.  You have increased pain or trouble seeing.  The outer eyelids become very red or swollen. Document Released: 05/17/2004 Document Revised: 07/02/2011 Document Reviewed: 04/09/2005 [facility] Patient Information 2015 [place], [place]. This information is not intended to replace advice given to you by your health care provider. Make sure you discuss any questions you have with your health care provider.

## 2014-05-17 NOTE — Progress Notes (Signed)
Pre visit review using our clinic review tool, if applicable. No additional management support is needed unless otherwise documented below in the visit note.  Chief Complaint  Patient presents with  . Bilateral Eye Redness    Also has matting and tearing X2wks.    HPI: Patient Tina [name]  comes in today for SDA for  new problem evaluation. Onset about 2 weeks ago  ;like with a cold  With some eye discharge and warm compressesw getting better and worse over weekend and now better today .   Tina [name]  Age 69 had bacterial  conjucntivitis .  No fever contacts .  No vision changes  No uri sx .  Now eye hurts  at night   Past hx of blepharitis but this is different  Dose lid hygiene ROS: See pertinent positives and negatives per HPI.no fever cough now. Glenford PeersUri better  No photophobia   Past Medical History  Diagnosis Date  . High triglycerides   . Hx of varicella     Family History  Problem Relation Age of Onset  . Hypertension Mother   . Hyperlipidemia Mother   . Diabetes Father   . Miscarriages / [place] Brother     History   Social History  . Marital Status: Divorced    Spouse Name: N/A    Number of Children: N/A  . 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 Smoking status: Former Games developermoker  . Smokeless tobacco: Not on file  . Alcohol Use: 0.5 oz/week    1 drink(s) per week  . Drug Use: No  . Sexual Activity: Not on file   Other Topics Concern  . Not on file   Social History Narrative    Now hhof 1 no pets    .    Mother  Moved down to friends home        Divorced one chid and grandchild   Neg ets firearms.    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      Educ PHD.    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    Exercises regularly      Neg tad     Outpatient Encounter Prescriptions as of 05/17/2014  Medication Sig  . cetirizine (ZYRTEC) 10 MG tablet Take 10 mg by mouth daily.  . fluticasone (FLONASE) 50 MCG/ACT  nasal spray PLACE 2 SPRAYS INTO THE NOSE DAILY  . MULTIPLE VITAMIN PO Take by mouth.    . niacin (NIASPAN) 500 MG CR tablet Take 500 mg by mouth at bedtime.  . Omega-3 Fatty Acids (OMEGA 3 PO) Take by mouth.    . cycloSPORINE (RESTASIS) 0.05 % ophthalmic emulsion 1 drop 2 (two) times daily. Hebert Sohoharles Burnes, MD gives this to pt.  . trimethoprim-polymyxin b (POLYTRIM) ophthalmic solution Place 1 drop into both eyes every 4 (four) hours. While awake    EXAM:  BP 130/80 mmHg  Temp(Src) 98.4 F (36.9 C) (Oral)  Ht 5\' 4"  (1.626 m)  Wt 121 lb 11.2 oz (55.203 kg)  BMI 20.88 kg/m2  Body mass index is 20.88 kg/(m^2).  GENERAL: vitals reviewed and listed above, alert, oriented, appears well hydrated and in no acute distress HEENT: atraumatic, conjunctiva  1+injection no  Dc at this time  no ciliary flush perrl  , no obvious abnormalities on inspection of external nose and ears OP : no lesion edema or exudate  NECK: no  obvious masses on inspection palpation  No adenopathy MS: moves all extremities without noticeable focal  abnormality PSYCH: pleasant and cooperative, no obvious depression or anxiety  ASSESSMENT AND PLAN:  Discussed the following assessment and plan:  Bilateral conjunctivitis Waxing and waning no alarm features but persisting  Add antiboitic drops consider broader spectrum and or  Eye fu if  persistent or progressive  -Patient advised to return or notify health care team  if symptoms worsen ,persist or new alarm  concerns arise.  Patient Instructions  Warm compresses and antibiotic  Drops for about 7- 10 days . If not getting better contact us consider different drop and  seeing eye doc .  Conjunctivitis Conjunctivitis is commonly called "pink eye." Conjunctivitis can be caused by bacterial or viral infection, allergies, or injuries. There is usually redness of the lining of the eye, itching, discomfort, and sometimes discharge. There may be deposits of matter along the  eyelids. A viral infection usually causes a watery discharge, while a bacterial infection causes a yellowish, thick discharge. Pink eye is very contagious and spreads by direct contact. You may be given antibiotic eyedrops as part of your treatment. Before using your eye medicine, remove all drainage from the eye by washing gently with warm water and cotton balls. Continue to use the medication until you have awakened 2 mornings in a row without discharge from the eye. Do not rub your eye. This increases the irritation and helps spread infection. Use separate towels from other household members. Wash your hands with soap and water before and after touching your eyes. Use cold compresses to reduce pain and sunglasses to relieve irritation from light. Do not wear contact lenses or wear eye makeup until the infection is gone. SEEK MEDICAL CARE IF:   Your symptoms are not better after 3 days of treatment.  You have increased pain or trouble seeing.  The outer eyelids become very red or swollen. Document Released: 05/17/2004 Document Revised: 07/02/2011 Document Reviewed: 04/09/2005 [facility] Patient Information 2015 [place], [place]. This information is not intended to replace advice given to you by your health care provider. Make sure you discuss any questions you have with your health care provider.      [name]. Keysi Oelkers M.D.

## 2014-07-12 ENCOUNTER — Other Ambulatory Visit (INDEPENDENT_AMBULATORY_CARE_PROVIDER_SITE_OTHER): Payer: BC Managed Care – PPO

## 2014-07-12 DIAGNOSIS — Z0000 Encounter for general adult medical examination without abnormal findings: Secondary | ICD-10-CM

## 2014-07-12 LAB — CBC WITH DIFFERENTIAL/PLATELET
BASOS ABS: 0 10*3/uL (ref 0.0–0.1)
Basophils Relative: 0.8 % (ref 0.0–3.0)
Eosinophils Absolute: 0.2 10*3/uL (ref 0.0–0.7)
Eosinophils Relative: 3.3 % (ref 0.0–5.0)
HCT: 42.5 % (ref 36.0–46.0)
Hemoglobin: 14.4 g/dL (ref 12.0–15.0)
LYMPHS ABS: 1.9 10*3/uL (ref 0.7–4.0)
Lymphocytes Relative: 38.1 % (ref 12.0–46.0)
MCHC: 34 g/dL (ref 30.0–36.0)
MCV: 86.2 fl (ref 78.0–100.0)
MONOS PCT: 6.6 % (ref 3.0–12.0)
Monocytes Absolute: 0.3 10*3/uL (ref 0.1–1.0)
NEUTROS PCT: 51.2 % (ref 43.0–77.0)
Neutro Abs: 2.6 10*3/uL (ref 1.4–7.7)
Platelets: 354 10*3/uL (ref 150.0–400.0)
RBC: 4.93 Mil/uL (ref 3.87–5.11)
RDW: 13.5 % (ref 11.5–15.5)
WBC: 5.1 10*3/uL (ref 4.0–10.5)

## 2014-07-12 LAB — BASIC METABOLIC PANEL
BUN: 20 mg/dL (ref 6–23)
CHLORIDE: 102 meq/L (ref 96–112)
CO2: 28 meq/L (ref 19–32)
Calcium: 9.6 mg/dL (ref 8.4–10.5)
Creatinine, Ser: 0.78 mg/dL (ref 0.40–1.20)
GFR: 78.17 mL/min (ref >60.00)
Glucose, Bld: 102 mg/dL — ABNORMAL HIGH (ref 70–99)
POTASSIUM: 4.4 meq/L (ref 3.5–5.1)
Sodium: 137 mEq/L (ref 135–145)

## 2014-07-12 LAB — HEPATIC FUNCTION PANEL
ALBUMIN: 4.3 g/dL (ref 3.5–5.2)
ALT: 20 U/L (ref 0–35)
AST: 22 U/L (ref 0–37)
Alkaline Phosphatase: 61 U/L (ref 39–117)
Bilirubin, Direct: 0.1 mg/dL (ref 0.0–0.3)
Total Bilirubin: 0.6 mg/dL (ref 0.2–1.2)
Total Protein: 7.2 g/dL (ref 6.0–8.3)

## 2014-07-12 LAB — HEMOGLOBIN A1C: HEMOGLOBIN A1C: 5.8 % (ref 4.6–6.5)

## 2014-07-12 LAB — LIPID PANEL
CHOL/HDL RATIO: 3
CHOLESTEROL: 205 mg/dL — AB (ref 0–200)
HDL: 72.9 mg/dL (ref >39.00)
LDL CALC: 119 mg/dL — AB (ref 0–99)
NonHDL: 132.1
Triglycerides: 68 mg/dL (ref 0.0–149.0)
VLDL: 13.6 mg/dL (ref 0.0–40.0)

## 2014-07-12 LAB — TSH: TSH: 2.83 u[IU]/mL (ref 0.35–4.50)

## 2014-07-19 ENCOUNTER — Encounter: Payer: Self-pay | Admitting: Internal Medicine

## 2014-07-19 ENCOUNTER — Ambulatory Visit (INDEPENDENT_AMBULATORY_CARE_PROVIDER_SITE_OTHER): Payer: BC Managed Care – PPO | Admitting: Internal Medicine

## 2014-07-19 VITALS — BP 122/72 | Temp 98.1°F | Ht 63.75 in | Wt 120.0 lb

## 2014-07-19 DIAGNOSIS — E782 Mixed hyperlipidemia: Secondary | ICD-10-CM

## 2014-07-19 DIAGNOSIS — M79671 Pain in right foot: Secondary | ICD-10-CM

## 2014-07-19 DIAGNOSIS — R7301 Impaired fasting glucose: Secondary | ICD-10-CM

## 2014-07-19 DIAGNOSIS — Z0000 Encounter for general adult medical examination without abnormal findings: Secondary | ICD-10-CM | POA: Diagnosis not present

## 2014-07-19 NOTE — Progress Notes (Signed)
Pre visit review using our clinic review tool, if applicable. No additional management support is needed unless otherwise documented below in the visit note.  Chief Complaint  Patient presents with  . Annual Exam    HPI: Patient  Tina [name]  69 y.o. comes in today for Preventive Health Care visit  No major change in health status since last visit . Taking niacion no se  Had skin check dr Danella Deis ok recently Going to retire and doreseaarch other things this summer. Parents  living in [place] Has grand children  Health Maintenance  Topic Date Due  . FOOT EXAM  12/27/1955  . OPHTHALMOLOGY EXAM  12/27/1955  . INFLUENZA VACCINE  11/22/2014  . URINE MICROALBUMIN  12/31/2014  . HEMOGLOBIN A1C  01/12/2015  . MAMMOGRAM  05/08/2015  . TETANUS/TDAP  03/18/2016  . COLONOSCOPY  09/13/2022  . DEXA SCAN  Completed  . ZOSTAVAX  Completed  . PNA vac Low Risk Adult  Completed   Health Maintenance Review LIFESTYLE:  Exercise:  Yes yoga and walking exrcise  Tobacco/ETS:no Alcohol: few times per week  Sugar beverages:no Sleep: good Drug use: no Bone density:  Colonoscopy: utd MAMMO:utd   ROS:  GEN/ HEENT: No fever, significant weight changes sweats headaches vision problems hearing changes, CV/ PULM; No chest pain shortness of breath cough, syncope,edema  change in exercise tolerance. GI /GU: No adominal pain, vomiting, change in bowel habits. No blood in the stool. No significant GU symptoms. SKIN/HEME: ,no acute skin rashes suspicious lesions or bleeding. No lymphadenopathy, nodules, masses.  NEURO/ PSYCH:  No neurologic signs such as weakness numbness. No depression anxiety.right foot pain with some tupes of prolonged acitiviy limiting her exercise  remot hx of metatarsal fx   IMM/ Allergy: No unusual infections.  Allergy .   REST of 12 system review negative except as per HPI   Past Medical History  Diagnosis Date  . High triglycerides   . Hx of varicella     Past Surgical  History  Procedure Laterality Date  . Cholecystectomy      Family History  Problem Relation Age of Onset  . Hypertension Mother   . Hyperlipidemia Mother   . Diabetes Father   . Miscarriages / [place] Brother     History   Social History  . Marital Status: Divorced    Spouse Name: N/A  . Number of Children: N/A  . 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 Smoking status: Former Games developer  . Smokeless tobacco: Not on file  . Alcohol Use: 0.5 oz/week    1 drink(s) per week  . Drug Use: No  . Sexual Activity: Not on file   Other Topics Concern  . None   Social History Narrative    Now [facility] no pets    .    Mother  Moved down to friends home        Divorced one chid and grandchild   Neg ets firearms.    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    Exercises regularly      Neg tad     Outpatient Encounter Prescriptions as of 07/19/2014  Medication Sig  . cetirizine (ZYRTEC) 10 MG tablet Take 10 mg by mouth daily.  . cycloSPORINE (RESTASIS) 0.05 % ophthalmic  emulsion 1 drop 2 (two) times daily. Hebert Soho, MD gives this to pt.  . MULTIPLE VITAMIN PO Take by mouth.    . niacin (NIASPAN) 500 MG CR tablet Take 500 mg by mouth at bedtime.  . Omega-3 Fatty Acids (OMEGA 3 PO) Take by mouth.    . fluticasone (FLONASE) 50 MCG/ACT nasal spray PLACE 2 SPRAYS INTO THE NOSE DAILY (Patient not taking: Reported on 07/19/2014)  . [DISCONTINUED] trimethoprim-polymyxin b (POLYTRIM) ophthalmic solution Place 1 drop into both eyes every 4 (four) hours. While awake    EXAM:  BP 122/72 mmHg  Temp(Src) 98.1 F (36.7 C) (Oral)  Ht 5' 3.75" (1.619 m)  Wt 120 lb (54.432 kg)  BMI 20.77 kg/m2  Body mass index is 20.77 kg/(m^2).  Physical Exam: Vital signs reviewed ZOX:WRUE is a well-developed well-nourished alert cooperative    who appearsr stated  age in no acute distress.  HEENT: normocephalic atraumatic , Eyes: PERRL EOM's full, conjunctiva clear, Nares: paten,t no deformity discharge or tenderness., Ears: no deformity EAC's clear TMs with normal landmarks. Mouth: clear OP, no lesions, edema.  Moist mucous membranes. Dentition in adequate repair. NECK: supple without masses, thyromegaly or bruits. CHEST/PULM:  Clear to auscultation and percussion breath sounds equal no wheeze , rales or rhonchi. No chest wall deformities or tenderness.Breast: normal by inspection . No dimpling, discharge, masses, tenderness or discharge . CV: PMI is nondisplaced, S1 S2 no gallops, murmurs, rubs. Peripheral pulses are full without delay.No JVD .  ABDOMEN: Bowel sounds normal nontender  No guard or rebound, no hepato splenomegal no CVA tenderness.  No hernia. Extremtities:  No clubbing cyanosis or edema, no acute joint swelling or redness no focal atrophy NEURO:  Oriented x3, cranial nerves 3-12 appear to be intact, no obvious focal weakness,gait within normal limits no abnormal reflexes or asymmetrical SKIN: No acute rashes normal turgor, color, no bruising or petechiae. PSYCH: Oriented, good eye contact, no obvious depression anxiety, cognition and judgment appear normal. LN: no cervical axillary inguinal adenopathy  Lab Results  Component Value Date   WBC 5.1 07/12/2014   HGB 14.4 07/12/2014   HCT 42.5 07/12/2014   PLT 354.0 07/12/2014   GLUCOSE 102* 07/12/2014   CHOL 205* 07/12/2014   TRIG 68.0 07/12/2014   HDL 72.90 07/12/2014   LDLDIRECT 137.3 07/02/2012   LDLCALC 119* 07/12/2014   ALT 20 07/12/2014   AST 22 07/12/2014   NA 137 07/12/2014   K 4.4 07/12/2014   CL 102 07/12/2014   CREATININE 0.78 07/12/2014   BUN 20 07/12/2014   CO2 28 07/12/2014   TSH 2.83 07/12/2014   HGBA1C 5.8 07/12/2014   MICROALBUR 0.5 12/30/2013   BP Readings from Last 3 Encounters:  07/19/14 122/72  05/17/14 130/80  01/08/14 118/74    ASSESSMENT AND  PLAN:  Discussed the following assessment and plan:  Visit for preventive health examination  Hyperlipidemia, mixed  Impaired fasting blood sugar  Right foot pain - with exercise  chr consider seeing [facility]  Reviewed healthy lifestyle continue monitor bg  At this time dont hink metformin  benefit more than risk.  Foot pain when exercising   If limiting consider see sports medicine  Patient Care Team: Madelin Headings, MD as PCP - General (Internal Medicine) Campbell Stall, MD as Attending Physician (Dermatology) Elliot Cousin, OD (Optometry) Patient Instructions   Continue lifestyle intervention healthy eating and exercise . Can see sports medicine.   When possible . About the foot pain right.  Yearly labs including fasting glucose and hga1c .   Healthy lifestyle includes : At least 150 minutes of exercise weeks  , weight at healthy levels, which is usually   BMI 19-25. Avoid trans fats and processed foods;  Increase fresh fruits and veges to 5 servings per day. And avoid sweet beverages including tea and juice. Mediterranean diet with olive oil and nuts have been noted to be heart and brain healthy . Avoid tobacco products . Limit  alcohol to  7 per week for women and 14 servings for men.  Get adequate sleep . Wear seat belts . Don't text and drive .         Why follow it? Research shows. . Those who follow the Mediterranean diet have a reduced risk of heart disease  . The diet is associated with a reduced incidence of Parkinson's and Alzheimer's diseases . People following the diet may have longer life expectancies and lower rates of chronic diseases  . The Dietary Guidelines for Americans recommends the Mediterranean diet as an eating plan to promote health and prevent disease  What Is the Mediterranean Diet?  . Healthy eating plan based on typical foods and recipes of Mediterranean-style cooking . The diet is primarily a plant based diet; these foods should  make up a majority of meals   Starches - Plant based foods should make up a majority of meals - They are an important sources of vitamins, minerals, energy, antioxidants, and fiber - Choose whole grains, foods high in fiber and minimally processed items  - Typical grain sources include wheat, oats, barley, corn, Sanzo rice, bulgar, farro, millet, polenta, couscous  - Various types of beans include chickpeas, lentils, fava beans, black beans, white beans   Fruits  Veggies - Large quantities of antioxidant rich fruits & veggies; 6 or more servings  - Vegetables can be eaten raw or lightly drizzled with oil and cooked  - Vegetables common to the traditional Mediterranean Diet include: artichokes, arugula, beets, broccoli, brussel sprouts, cabbage, carrots, celery, collard greens, cucumbers, eggplant, kale, leeks, lemons, lettuce, mushrooms, okra, onions, peas, peppers, potatoes, pumpkin, radishes, rutabaga, shallots, spinach, sweet potatoes, turnips, zucchini - Fruits common to the Mediterranean Diet include: apples, apricots, avocados, cherries, clementines, dates, figs, grapefruits, grapes, melons, nectarines, oranges, peaches, pears, pomegranates, strawberries, tangerines  Fats - Replace butter and margarine with healthy oils, such as olive oil, canola oil, and tahini  - Limit nuts to no more than a handful a day  - Nuts include walnuts, almonds, pecans, pistachios, pine nuts  - Limit or avoid candied, honey roasted or heavily salted nuts - Olives are central to the [employer] - can be eaten whole or used in a variety of dishes   Meats Protein - Limiting red meat: no more than a few times a month - When eating red meat: choose lean cuts and keep the portion to the size of deck of cards - Eggs: approx. 0 to 4 times a week  - Fish and lean poultry: at least 2 a week  - Healthy protein sources include, chicken, [place], lean beef, lamb - Increase intake of seafood such as tuna, salmon,  trout, mackerel, shrimp, scallops - Avoid or limit high fat processed meats such as sausage and bacon  Dairy - Include moderate amounts of low fat dairy products  - Focus on healthy dairy such as fat free yogurt, skim milk, low or reduced fat cheese - Limit dairy products higher in fat such as  whole or 2% milk, cheese, ice cream  Alcohol - Moderate amounts of red wine is ok  - No more than 5 oz daily for women (all ages) and men older than age 68  - No more than 10 oz of wine daily for men younger than 6365  Other - Limit sweets and other desserts  - Use herbs and spices instead of salt to flavor foods  - Herbs and spices common to the traditional Mediterranean Diet include: basil, bay leaves, chives, cloves, cumin, fennel, garlic, lavender, marjoram, mint, oregano, parsley, pepper, rosemary, sage, savory, sumac, tarragon, thyme   It's not just a diet, it's a lifestyle:  . The Mediterranean diet includes lifestyle factors typical of those in the region  . Foods, drinks and meals are best eaten with others and savored . Daily physical activity is important for overall good health . This could be strenuous exercise like running and aerobics . This could also be more leisurely activities such as walking, housework, yard-work, or taking the stairs . Moderation is the key; a balanced and healthy diet accommodates most foods and drinks . Consider portion sizes and frequency of consumption of certain foods   Meal Ideas & Options:  . Breakfast:  o Whole wheat toast or whole wheat English muffins with peanut butter & hard boiled egg o Steel cut oats topped with apples & cinnamon and skim milk  o Fresh fruit: banana, strawberries, melon, berries, peaches  o Smoothies: strawberries, bananas, greek yogurt, peanut butter o Low fat greek yogurt with blueberries and granola  o Egg white omelet with spinach and mushrooms o Breakfast couscous: whole wheat couscous, apricots, skim milk, cranberries   . Sandwiches:  o Hummus and grilled vegetables (peppers, zucchini, squash) on whole wheat bread   o Grilled chicken on whole wheat pita with lettuce, tomatoes, cucumbers or tzatziki  o Tuna salad on whole wheat bread: tuna salad made with greek yogurt, olives, red peppers, capers, green onions o Garlic rosemary lamb pita: lamb sauted with garlic, rosemary, salt & pepper; add lettuce, cucumber, greek yogurt to pita - flavor with lemon juice and black pepper  . Seafood:  o Mediterranean grilled salmon, seasoned with garlic, basil, parsley, lemon juice and black pepper o Shrimp, lemon, and spinach whole-grain pasta salad made with low fat greek yogurt  o Seared scallops with lemon orzo  o Seared tuna steaks seasoned salt, pepper, coriander topped with tomato mixture of olives, tomatoes, olive oil, minced garlic, parsley, green onions and cappers  . Meats:  o Herbed greek chicken salad with kalamata olives, cucumber, feta  o Red bell peppers stuffed with spinach, bulgur, lean ground beef (or lentils) & topped with feta   o Kebabs: skewers of chicken, tomatoes, onions, zucchini, squash  o [place] burgers: made with red onions, mint, dill, lemon juice, feta cheese topped with roasted red peppers . Vegetarian o Cucumber salad: cucumbers, artichoke hearts, celery, red onion, feta cheese, tossed in olive oil & lemon juice  o Hummus and whole grain pita points with a greek salad (lettuce, tomato, feta, olives, cucumbers, red onion) o Lentil soup with celery, carrots made with vegetable broth, garlic, salt and pepper  o Tabouli salad: parsley, bulgur, mint, scallions, cucumbers, tomato, radishes, lemon juice, olive oil, salt and pepper.              Neta MendsWanda K. Panosh M.D.

## 2014-07-19 NOTE — Patient Instructions (Signed)
Continue lifestyle intervention healthy eating and exercise . Can see sports medicine.   When possible . About the foot pain right.   Yearly labs including fasting glucose and hga1c .   Healthy lifestyle includes : At least 150 minutes of exercise weeks  , weight at healthy levels, which is usually   BMI 19-25. Avoid trans fats and processed foods;  Increase fresh fruits and veges to 5 servings per day. And avoid sweet beverages including tea and juice. Mediterranean diet with olive oil and nuts have been noted to be heart and brain healthy . Avoid tobacco products . Limit  alcohol to  7 per week for women and 14 servings for men.  Get adequate sleep . Wear seat belts . Don't text and drive .         Why follow it? Research shows. . Those who follow the Mediterranean diet have a reduced risk of heart disease  . The diet is associated with a reduced incidence of Parkinson's and Alzheimer's diseases . People following the diet may have longer life expectancies and lower rates of chronic diseases  . The Dietary Guidelines for Americans recommends the Mediterranean diet as an eating plan to promote health and prevent disease  What Is the Mediterranean Diet?  . Healthy eating plan based on typical foods and recipes of Mediterranean-style cooking . The diet is primarily a plant based diet; these foods should make up a majority of meals   Starches - Plant based foods should make up a majority of meals - They are an important sources of vitamins, minerals, energy, antioxidants, and fiber - Choose whole grains, foods high in fiber and minimally processed items  - Typical grain sources include wheat, oats, barley, corn, [place] rice, bulgar, farro, millet, polenta, couscous  - Various types of beans include chickpeas, lentils, fava beans, black beans, white beans   Fruits  Veggies - Large quantities of antioxidant rich fruits & veggies; 6 or more servings  - Vegetables can be eaten raw or  lightly drizzled with oil and cooked  - Vegetables common to the traditional Mediterranean Diet include: artichokes, arugula, beets, broccoli, brussel sprouts, cabbage, carrots, celery, collard greens, cucumbers, eggplant, kale, leeks, lemons, lettuce, mushrooms, okra, onions, peas, peppers, potatoes, pumpkin, radishes, rutabaga, shallots, spinach, sweet potatoes, turnips, zucchini - Fruits common to the Mediterranean Diet include: apples, apricots, avocados, cherries, clementines, dates, figs, grapefruits, grapes, melons, nectarines, oranges, peaches, pears, pomegranates, strawberries, tangerines  Fats - Replace butter and margarine with healthy oils, such as olive oil, canola oil, and tahini  - Limit nuts to no more than a handful a day  - Nuts include walnuts, almonds, pecans, pistachios, pine nuts  - Limit or avoid candied, honey roasted or heavily salted nuts - Olives are central to the [employer] diet - can be eaten whole or used in a variety of dishes   Meats Protein - Limiting red meat: no more than a few times a month - When eating red meat: choose lean cuts and keep the portion to the size of deck of cards - Eggs: approx. 0 to 4 times a week  - Fish and lean poultry: at least 2 a week  - Healthy protein sources include, chicken, [place], lean beef, lamb - Increase intake of seafood such as tuna, salmon, trout, mackerel, shrimp, scallops - Avoid or limit high fat processed meats such as sausage and bacon  Dairy - Include moderate amounts of low fat dairy products  - Focus on  healthy dairy such as fat free yogurt, skim milk, low or reduced fat cheese - Limit dairy products higher in fat such as whole or 2% milk, cheese, ice cream  Alcohol - Moderate amounts of red wine is ok  - No more than 5 oz daily for women (all ages) and men older than age 60  - No more than 10 oz of wine daily for men younger than 73  Other - Limit sweets and other desserts  - Use herbs and spices instead of  salt to flavor foods  - Herbs and spices common to the traditional Mediterranean Diet include: basil, bay leaves, chives, cloves, cumin, fennel, garlic, lavender, marjoram, mint, oregano, parsley, pepper, rosemary, sage, savory, sumac, tarragon, thyme   It's not just a diet, it's a lifestyle:  . The Mediterranean diet includes lifestyle factors typical of those in the region  . Foods, drinks and meals are best eaten with others and savored . Daily physical activity is important for overall good health . This could be strenuous exercise like running and aerobics . This could also be more leisurely activities such as walking, housework, yard-work, or taking the stairs . Moderation is the key; a balanced and healthy diet accommodates most foods and drinks . Consider portion sizes and frequency of consumption of certain foods   Meal Ideas & Options:  . Breakfast:  o Whole wheat toast or whole wheat English muffins with peanut butter & hard boiled egg o Steel cut oats topped with apples & cinnamon and skim milk  o Fresh fruit: banana, strawberries, melon, berries, peaches  o Smoothies: strawberries, bananas, greek yogurt, peanut butter o Low fat greek yogurt with blueberries and granola  o Egg white omelet with spinach and mushrooms o Breakfast couscous: whole wheat couscous, apricots, skim milk, cranberries  . Sandwiches:  o Hummus and grilled vegetables (peppers, zucchini, squash) on whole wheat bread   o Grilled chicken on whole wheat pita with lettuce, tomatoes, cucumbers or tzatziki  o Tuna salad on whole wheat bread: tuna salad made with greek yogurt, olives, red peppers, capers, green onions o Garlic rosemary lamb pita: lamb sauted with garlic, rosemary, salt & pepper; add lettuce, cucumber, greek yogurt to pita - flavor with lemon juice and black pepper  . Seafood:  o Mediterranean grilled salmon, seasoned with garlic, basil, parsley, lemon juice and black pepper o Shrimp, lemon, and  spinach whole-grain pasta salad made with low fat greek yogurt  o Seared scallops with lemon orzo  o Seared tuna steaks seasoned salt, pepper, coriander topped with tomato mixture of olives, tomatoes, olive oil, minced garlic, parsley, green onions and cappers  . Meats:  o Herbed greek chicken salad with kalamata olives, cucumber, feta  o Red bell peppers stuffed with spinach, bulgur, lean ground beef (or lentils) & topped with feta   o Kebabs: skewers of chicken, tomatoes, onions, zucchini, squash  o [place] burgers: made with red onions, mint, dill, lemon juice, feta cheese topped with roasted red peppers . Vegetarian o Cucumber salad: cucumbers, artichoke hearts, celery, red onion, feta cheese, tossed in olive oil & lemon juice  o Hummus and whole grain pita points with a greek salad (lettuce, tomato, feta, olives, cucumbers, red onion) o Lentil soup with celery, carrots made with vegetable broth, garlic, salt and pepper  o Tabouli salad: parsley, bulgur, mint, scallions, cucumbers, tomato, radishes, lemon juice, olive oil, salt and pepper.

## 2014-08-18 ENCOUNTER — Other Ambulatory Visit: Payer: Self-pay | Admitting: Internal Medicine

## 2014-08-18 NOTE — Telephone Encounter (Signed)
Sent to the pharmacy by e-scribe. 

## 2014-08-18 NOTE — Telephone Encounter (Signed)
[facility]  Will send for authorization.

## 2014-08-18 NOTE — Telephone Encounter (Signed)
Can refill through march 2017

## 2014-08-31 LAB — HM MAMMOGRAPHY

## 2014-09-01 ENCOUNTER — Encounter: Payer: Self-pay | Admitting: Family Medicine

## 2014-09-29 ENCOUNTER — Other Ambulatory Visit: Payer: Self-pay | Admitting: Internal Medicine

## 2014-09-30 NOTE — Telephone Encounter (Signed)
Denied.  Filled on 08/18/14 for 11 months.  Request is too soon.

## 2014-10-15 ENCOUNTER — Encounter: Payer: Self-pay | Admitting: Internal Medicine

## 2014-10-15 ENCOUNTER — Ambulatory Visit (INDEPENDENT_AMBULATORY_CARE_PROVIDER_SITE_OTHER): Payer: BC Managed Care – PPO | Admitting: Internal Medicine

## 2014-10-15 VITALS — BP 156/82 | Temp 98.8°F | Wt 120.0 lb

## 2014-10-15 DIAGNOSIS — J029 Acute pharyngitis, unspecified: Secondary | ICD-10-CM | POA: Diagnosis not present

## 2014-10-15 LAB — POCT RAPID STREP A (OFFICE): RAPID STREP A SCREEN: NEGATIVE

## 2014-10-15 NOTE — Patient Instructions (Addendum)
Checking for strep throat . Rapid is negative .  Will contact you when culture result back but contact us over the weekend if fever worsening

## 2014-10-15 NOTE — Progress Notes (Signed)
Pre visit review using our clinic review tool, if applicable. No additional management support is needed unless otherwise documented below in the visit note.  Chief Complaint  Patient presents with  . Sore Throat  . Fever  . Generalized Body Aches    HPI: Patient Tina [name]  comes in today for SDA for  new problem evaluation. Just got back from [place]. Onset about a 5-6 days ago Last fever  3 days ago . Hard to swallow  Better but not that good.  No cough no runny nose or cough . Remote  Hx of strep...  No exudate but glands are sore   ROS: See pertinent positives and negatives per HPI. No new  rash  Past Medical History  Diagnosis Date  . High triglycerides   . Hx of varicella     Family History  Problem Relation Age of Onset  . Hypertension Mother   . Hyperlipidemia Mother   . Diabetes Father   . Miscarriages / [place] Brother     History   Social History  . Marital Status: Divorced    Spouse Name: N/A  . Number of Children: N/A  . 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 Smoking status: Former Games developer  . Smokeless tobacco: Not on file  . Alcohol Use: 0.5 oz/week    1 drink(s) per week  . Drug Use: No  . Sexual Activity: Not on file   Other Topics Concern  . None   Social History Narrative    Now [facility] no pets    .    Mother  Moved down to friends home        Divorced one chid and grandchild   Neg ets firearms.    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    Exercises regularly      Neg tad     Outpatient Prescriptions Prior to Visit  Medication Sig Dispense Refill  . cetirizine (ZYRTEC) 10 MG tablet Take 10 mg by mouth daily.    . cycloSPORINE (RESTASIS) 0.05 % ophthalmic emulsion 1 drop 2 (two) times daily. Hebert Soho, MD gives this to pt.    . MULTIPLE VITAMIN PO Take by mouth.      . niacin  (NIASPAN) 500 MG CR tablet Take 500 mg by mouth at bedtime.    . Omega-3 Fatty Acids (OMEGA 3 PO) Take by mouth.      . fluticasone (FLONASE) 50 MCG/ACT nasal spray PLACE 2 SPRAYS INTO THE NOSE DAILY (Patient not taking: Reported on 07/19/2014) 16 g 2  . niacin (NIASPAN) 500 MG CR tablet TAKE 2 TABLET AT BEDTIME 60 tablet 10   No facility-administered medications prior to visit.     EXAM:  BP 156/82 mmHg  Temp(Src) 98.8 F (37.1 C) (Oral)  Wt 120 lb (54.432 kg)  Body mass index is 20.77 kg/(m^2).  GENERAL: vitals reviewed and listed above, alert, oriented, appears well hydrated and in no acute distress HEENT: atraumatic, conjunctiva  clear, no obvious abnormalities on inspection of external nose and earstmx clear  OP : no lesion edema or exudate   Red 1 + n NECK: no obvious masses on inspection  Tender ad nodes left more than right no pc LUNGS: clear to auscultation bilaterally, no wheezes, rales or  rhonchi, good air movement CV: HRRR, no clubbing cyanosis or  peripheral edema nl cap refill  Abdomen:  Sof,t normal bowel sounds without hepatosplenomegaly, no guarding rebound or masses no CVA tenderness skin no acute rashes MS: moves all extremities without noticeable focal  abnormality PSYCH: pleasant and cooperative,   ASSESSMENT AND PLAN:  Discussed the following assessment and plan:  Acute pharyngitis, unspecified pharyngitis type - Plan: POCT rapid strep A, Culture, Group A Strep No coryza  Consider strep other  rs neg cx pending  Consider emprirc rx  If progressive call on call service  ( i am on call this weekend) considier adding empiric antibiotic if needed pending cx results -Patient advised to return or notify health care team  if symptoms worsen ,persist or new concerns arise.  Patient Instructions  Checking for strep throat . Rapid is negative .  Will contact you when culture result back but contact us over the weekend if fever worsening    [name]. Panosh  M.D.

## 2014-10-17 LAB — CULTURE, GROUP A STREP

## 2014-10-18 ENCOUNTER — Other Ambulatory Visit: Payer: Self-pay

## 2014-10-18 ENCOUNTER — Other Ambulatory Visit: Payer: Self-pay | Admitting: Family Medicine

## 2014-10-18 MED ORDER — AMOXICILLIN 500 MG PO CAPS: 500.0000 mg | ORAL_CAPSULE | Freq: Two times a day (BID) | ORAL | Status: DC

## 2014-10-22 ENCOUNTER — Telehealth: Payer: Self-pay | Admitting: Internal Medicine

## 2014-10-22 MED ORDER — FLUCONAZOLE 150 MG PO TABS: 150.0000 mg | ORAL_TABLET | Freq: Once | ORAL | Status: DC

## 2014-10-22 NOTE — Telephone Encounter (Signed)
Pt notified to pick up at the pharmacy. 

## 2014-10-22 NOTE — Telephone Encounter (Signed)
[facility] - Client TELEPHONE ADVICE RECORD [facility] Patient Name: Tina [name] DOB: 04/15/1946 Initial Comment Caller states c/o vaginal itching - is on antibiotics for strep throat Nurse Assessment Nurse: [name], RN, Windy Date/Time (Eastern Time): 10/22/2014 11:49:25 AM Confirm and document reason for call. If symptomatic, describe symptoms. ---Caller states c/o mild vaginal itching. She is on antibiotics for strep throat for last 5 days. She is going to be traveling for the holiday, and would like the option to take Diflucan for possible yeast infection if s/s progress or worsen. [facility] [facility] [address] [place] NK[place]. - Denies abdominal pain, fever, no vaginal discharge. Has the patient traveled out of the country within the last 30 days? ---No Does the patient require triage? ---Yes Related visit to physician within the last 2 weeks? ---Yes Does the PT have any chronic conditions? (i.e. diabetes, asthma, etc.) ---Yes List chronic conditions. ---previous yeast infections when she has been on antibiotics - more than 5 years ago; the last time she got the Diflucan she didn't need to take it after all but it was a shorter round of antibiotics Guidelines Guideline Title Affirmed Question Affirmed Notes Vaginal Symptoms [1] Symptoms of a yeast infection (i.e., itchy, white discharge, not bad smelling) AND [2] feels like prior vaginal yeast infections (all triage questions negative) --She would prefer to have the oral med - Diflucan if possible just if needed. Final Disposition [facility] [name], [place], [name] Comments Caller wondering if should have 2 Diflucan pills to take QOD, and when should she start it? Start of worsening s/s vs. waiting til she finishes the antibiotic. Please call her before 4 pm with response today if possible.

## 2014-10-22 NOTE — Telephone Encounter (Signed)
Verbal order

## 2014-10-22 NOTE — Telephone Encounter (Signed)
Please advise around Diflucan administration concerns at bottom of note

## 2014-10-22 NOTE — Addendum Note (Signed)
Addended by: Raj JanusADKINS, MISTY T on: 10/22/2014 12:48 PM   Modules accepted: Orders

## 2015-04-01 ENCOUNTER — Ambulatory Visit (INDEPENDENT_AMBULATORY_CARE_PROVIDER_SITE_OTHER): Payer: Medicare Other | Admitting: Family Medicine

## 2015-04-01 ENCOUNTER — Encounter: Payer: Self-pay | Admitting: Family Medicine

## 2015-04-01 VITALS — BP 122/74 | HR 75 | Ht 63.75 in | Wt 121.0 lb

## 2015-04-01 DIAGNOSIS — M25579 Pain in unspecified ankle and joints of unspecified foot: Secondary | ICD-10-CM

## 2015-04-01 DIAGNOSIS — M7742 Metatarsalgia, left foot: Secondary | ICD-10-CM

## 2015-04-01 DIAGNOSIS — M7741 Metatarsalgia, right foot: Secondary | ICD-10-CM | POA: Diagnosis not present

## 2015-04-01 NOTE — Progress Notes (Signed)
Pre visit review using our clinic review tool, if applicable. No additional management support is needed unless otherwise documented below in the visit note. 

## 2015-04-01 NOTE — Assessment & Plan Note (Signed)
Mild breakdown of the transverse arch bilaterally. Patient does have a narrow foot overall. We discussed proper shoe choices, over-the-counter orthotics, vitamin supplementation and icing protocol. Patient work with Event organiserathletic trainer to learn home exercises in greater detail. Patient will come back and see me again in 4 weeks for further evaluation. Patient's breakdown of her foot is only moderate site and does not know if she will be needing custom orthotics.

## 2015-04-01 NOTE — Patient Instructions (Signed)
Good to see you Ice if it does act up Exercises 3 times a week.  Vitamin D 2000 IU daily  Help with strength and endurance.  Turmeric 500mg  twice daily If enough pain the do ibuprofen 600mg  3 times a day for  3 days Spenco orthotics "total support" online could help Consider shoes with a rocker bottom.  Look at [employer] and [employer].  Happy holidays! See me again in 4 weeks if not perfect.

## 2015-04-01 NOTE — Progress Notes (Signed)
[name] [address] [place], [place] [address] Phone: [phone] Subjective:    I'm seeing this patient by the request  of:  Lorretta Harp, MD   CC: Foot pain.   [id number] Tina [name] is a 69 y.o. female coming in with complaint of foot pain. Seems to be worse on the right side. Patient has it intermittently. Patient has been walking more often secondary to having a new puppy. Patient has had this pain for many years off and on and has seen other providers. Patient has had 2 sets of custom orthotics. Continues to wear the last one. Seems to do relatively well but continues to have pain mostly on the plantar aspect ball of her foot. Denies any numbness with. Sometimes can have some cramping at night. Rates the severity of 4 out of 10. Does not stop her from activities. Has not tried any significant home modalities except switching shoes.    Past Medical History  Diagnosis Date  . High triglycerides   . Hx of varicella    Past Surgical History  Procedure Laterality Date  . Cholecystectomy     Social History  Substance Use Topics  . Smoking status: Former Games developer  . Smokeless tobacco: None  . Alcohol Use: 0.5 oz/week    1 drink(s) per week   No Known Allergies Family History  Problem Relation Age of Onset  . Hypertension Mother   . Hyperlipidemia Mother   . Diabetes Father   . Miscarriages / [place] Brother     Past medical history, social, surgical and family history all reviewed in electronic medical record.   Review of Systems: No headache, visual changes, nausea, vomiting, diarrhea, constipation, dizziness, abdominal pain, skin rash, fevers, chills, night sweats, weight loss, swollen lymph nodes, body aches, joint swelling, muscle aches, chest pain, shortness of breath, mood changes.   Objective Blood pressure 122/74, pulse 75, height 5' 3.75" (1.619 m), weight 121 lb (54.885 kg), SpO2 99 %.  General: No apparent  distress alert and oriented x3 mood and affect normal, dressed appropriately.  HEENT: Pupils equal, extraocular movements intact  Respiratory: Patient's speak in full sentences and does not appear short of breath  Cardiovascular: No lower extremity edema, non tender, no erythema  Skin: Warm dry intact with no signs of infection or rash on extremities or on axial skeleton.  Abdomen: Soft nontender  Neuro: Cranial nerves II through XII are intact, neurovascularly intact in all extremities with 2+ DTRs and 2+ pulses.  Lymph: No lymphadenopathy of posterior or anterior cervical chain or axillae bilaterally.  Gait normal with good balance and coordination.  MSK:  Non tender with full range of motion and good stability and symmetric strength and tone of shoulders, elbows, wrist, hip, knee and ankles bilaterally.  Foot Exam shows mild collapse of the transverse arch bilaterally. Patient did have an injury it appears on the right fifth metatarsal is well-healed at this time.   Minimal tenderness to palpation over the fourth and fifth interspace. Patient also minimal tenderness over the first MCP joint.    Procedure note  97110; 15 minutes spent for Therapeutic exercises as stated in above notes.  This included exercises focusing on stretching, strengthening, with significant focus on eccentric aspects. Stretches to help lengthen the lower leg and plantar fascia areas Theraband exercises for the lower leg and ankle to help strengthen the surrounding area- dorsiflexion, plantarflexion, inversion, eversion Massage rolling on the plantar surface of the foot with a  frozen bottle, tennis ball or golf ball Towel or marble pick-ups to strengthen the plantar surface of the foot Weight bearing exercises to increase balance and overall stability  Proper technique shown and discussed handout in great detail with ATC.  All questions were discussed and answered.   Impression and Recommendations:     This case  required medical decision making of moderate complexity.

## 2015-04-29 ENCOUNTER — Encounter: Payer: Self-pay | Admitting: Family Medicine

## 2015-04-29 ENCOUNTER — Ambulatory Visit (INDEPENDENT_AMBULATORY_CARE_PROVIDER_SITE_OTHER): Payer: Medicare Other | Admitting: Family Medicine

## 2015-04-29 VITALS — BP 122/80 | HR 78 | Ht 63.75 in | Wt 123.0 lb

## 2015-04-29 DIAGNOSIS — M7741 Metatarsalgia, right foot: Secondary | ICD-10-CM

## 2015-04-29 DIAGNOSIS — M9902 Segmental and somatic dysfunction of thoracic region: Secondary | ICD-10-CM | POA: Diagnosis not present

## 2015-04-29 DIAGNOSIS — M542 Cervicalgia: Secondary | ICD-10-CM

## 2015-04-29 DIAGNOSIS — M9901 Segmental and somatic dysfunction of cervical region: Secondary | ICD-10-CM | POA: Diagnosis not present

## 2015-04-29 DIAGNOSIS — M9908 Segmental and somatic dysfunction of rib cage: Secondary | ICD-10-CM

## 2015-04-29 DIAGNOSIS — M7742 Metatarsalgia, left foot: Secondary | ICD-10-CM

## 2015-04-29 DIAGNOSIS — M999 Biomechanical lesion, unspecified: Secondary | ICD-10-CM | POA: Insufficient documentation

## 2015-04-29 NOTE — Assessment & Plan Note (Signed)
Improvement with conservative therapy. Patient will continue the orthotics in the shoes. We discussed home exercises encouraged to continue to do 3 times a week. If pain is not completely resolved in 6 weeks to come back for further evaluation.

## 2015-04-29 NOTE — Assessment & Plan Note (Signed)
Patient does have pain mostly of the muscular skeletal aspect on the left side. Patient does have some tension at the occipital level. I do not have any signs of radicular symptoms with a negative Spurling's. Full strength. Patient given soft manipulation technique and did respond well to osteopathic manipulation. We discussed postural changes and showed some proper technique. Patient come back and see me again in 3-4 weeks for further evaluation and treatment.

## 2015-04-29 NOTE — Patient Instructions (Addendum)
Good to see you Ice when it acts up for 10-20 minutes On wall with heels, butt shoulder and head touching for a goal of 5 minutes daily  Following exercises 2-3 times a week 2 tennis ball in tube sock lay on it where head meets wall pennsaid pinkie amount topically 2 times daily as needed.  Keep arm down with sleepy See me again in 4-6 weeks if not perfect or want more manipulation Standing:  Secure a rubber exercise band/tubing so that it is at the height of your shoulders when you are either standing or sitting on a firm arm-less chair.  Grasp an end of the band/tubing in each hand and have your palms face each other. Straighten your elbows and lift your hands straight in front of you at shoulder height. Step back away from the secured end of band/tubing until it becomes tense.  Squeeze your shoulder blades together. Keeping your elbows locked and your hands at shoulder-height, bring your hands out to your side.  Hold __________ seconds. Slowly ease the tension on the band/tubing as you reverse the directions and return to the starting position. Repeat __________ times. Complete this exercise __________ times per day. STRENGTH - Scapular Retractors  Secure a rubber exercise band/tubing so that it is at the height of your shoulders when you are either standing or sitting on a firm arm-less chair.  With a palm-down grip, grasp an end of the band/tubing in each hand. Straighten your elbows and lift your hands straight in front of you at shoulder height. Step back away from the secured end of band/tubing until it becomes tense.  Squeezing your shoulder blades together, draw your elbows back as you bend them. Keep your upper arm lifted away from your body throughout the exercise.  Hold __________ seconds. Slowly ease the tension on the band/tubing as you reverse the directions and return to the starting position. Repeat __________ times. Complete this exercise __________ times per  day. STRENGTH - Shoulder Extensors   Secure a rubber exercise band/tubing so that it is at the height of your shoulders when you are either standing or sitting on a firm arm-less chair.  With a thumbs-up grip, grasp an end of the band/tubing in each hand. Straighten your elbows and lift your hands straight in front of you at shoulder height. Step back away from the secured end of band/tubing until it becomes tense.  Squeezing your shoulder blades together, pull your hands down to the sides of your thighs. Do not allow your hands to go behind you.  Hold for __________ seconds. Slowly ease the tension on the band/tubing as you reverse the directions and return to the starting position. Repeat __________ times. Complete this exercise __________ times per day.  STRENGTH - Scapular Retractors and External Rotators  Secure a rubber exercise band/tubing so that it is at the height of your shoulders when you are either standing or sitting on a firm arm-less chair.  With a palm-down grip, grasp an end of the band/tubing in each hand. Bend your elbows 90 degrees and lift your elbows to shoulder height at your sides. Step back away from the secured end of band/tubing until it becomes tense.  Squeezing your shoulder blades together, rotate your shoulder so that your upper arm and elbow remain stationary, but your fists travel upward to head-height.  Hold __________ for seconds. Slowly ease the tension on the band/tubing as you reverse the directions and return to the starting position. Repeat __________ times. Complete  this exercise __________ times per day.  STRENGTH - Scapular Retractors and External Rotators, Rowing  Secure a rubber exercise band/tubing so that it is at the height of your shoulders when you are either standing or sitting on a firm arm-less chair.  With a palm-down grip, grasp an end of the band/tubing in each hand. Straighten your elbows and lift your hands straight in front of you  at shoulder height. Step back away from the secured end of band/tubing until it becomes tense.  Step 1: Squeeze your shoulder blades together. Bending your elbows, draw your hands to your chest as if you are rowing a boat. At the end of this motion, your hands and elbow should be at shoulder-height and your elbows should be out to your sides.  Step 2: Rotate your shoulder to raise your hands above your head. Your forearms should be vertical and your upper-arms should be horizontal.  Hold for __________ seconds. Slowly ease the tension on the band/tubing as you reverse the directions and return to the starting position. Repeat __________ times. Complete this exercise __________ times per day.  STRENGTH - Scapular Retractors and Elevators  Secure a rubber exercise band/tubing so that it is at the height of your shoulders when you are either standing or sitting on a firm arm-less chair.  With a thumbs-up grip, grasp an end of the band/tubing in each hand. Step back away from the secured end of band/tubing until it becomes tense.  Squeezing your shoulder blades together, straighten your elbows and lift your hands straight over your head.  Hold for __________ seconds. Slowly ease the tension on the band/tubing as you reverse the directions and return to the starting position. Repeat __________ times. Complete this exercise __________ times per day.    This information is not intended to replace advice given to you by your health care provider. Make sure you discuss any questions you have with your health care provider.   Document Released: 04/09/2005 Document Revised: 08/24/2014 Document Reviewed: 07/22/2008 Elsevier Interactive Patient Education [employer]

## 2015-04-29 NOTE — Assessment & Plan Note (Signed)
Decision today to treat with OMT was based on Physical Exam  After verbal consent patient was treated with HVLA, ME, FPr techniques in cervical, thoracic and rib areas  Patient tolerated the procedure well with improvement in symptoms  Patient given exercises, stretches and lifestyle modifications  See medications in patient instructions if given  Patient will follow up in 3-6 weeks

## 2015-04-29 NOTE — Progress Notes (Signed)
[name] [address] [place], [place] [address] Phone: [phone] Subjective:     CC: Foot pain follow-up New neck pain   [id number] Tina [name] is a 70 y.o. female coming in with complaint of foot pain.  Patient had bilateral foot pain does seem to be more of a metatarsalgia. Patient has gotten new shoes as well as the over-the-counter orthotics and has made significant improvement. Doing the exercises occasionally. Able to walk much longer distances with less pain. Very happy with the results.  Patient is coming in with a new prone. An states it is more of a left-sided neck pain. Has been intermittent for many years. Does not rib or brain he true injury. Patient describes the pain as more of a dull, throbbing aching sensation. Patient states that it as a sharp pain with certain movements. Seems to sometimes radiate up towards her head but never down her arm. Sometimes can be associated with a headache. Denies any visual changes. Rates the severity of pain when it occurs as 5 out of 10. Seems to make her concern but never makes her stop any activity.    Past Medical History  Diagnosis Date  . High triglycerides   . Hx of varicella    Past Surgical History  Procedure Laterality Date  . Cholecystectomy     Social History  Substance Use Topics  . Smoking status: Former Games developer  . Smokeless tobacco: None  . Alcohol Use: 0.5 oz/week    1 drink(s) per week   No Known Allergies Family History  Problem Relation Age of Onset  . Hypertension Mother   . Hyperlipidemia Mother   . Diabetes Father   . Miscarriages / [place] Brother     Past medical history, social, surgical and family history all reviewed in electronic medical record.   Review of Systems: No headache, visual changes, nausea, vomiting, diarrhea, constipation, dizziness, abdominal pain, skin rash, fevers, chills, night sweats, weight loss, swollen lymph nodes, body aches, joint  swelling, muscle aches, chest pain, shortness of breath, mood changes.   Objective Blood pressure 122/80, pulse 78, height 5' 3.75" (1.619 m), weight 123 lb (55.792 kg), SpO2 98 %.  General: No apparent distress alert and oriented x3 mood and affect normal, dressed appropriately.  HEENT: Pupils equal, extraocular movements intact  Respiratory: Patient's speak in full sentences and does not appear short of breath  Cardiovascular: No lower extremity edema, non tender, no erythema  Skin: Warm dry intact with no signs of infection or rash on extremities or on axial skeleton.  Abdomen: Soft nontender  Neuro: Cranial nerves II through XII are intact, neurovascularly intact in all extremities with 2+ DTRs and 2+ pulses.  Lymph: No lymphadenopathy of posterior or anterior cervical chain or axillae bilaterally.  Gait normal with good balance and coordination.  MSK:  Non tender with full range of motion and good stability and symmetric strength and tone of shoulders, elbows, wrist, hip, knee and ankles bilaterally.  Foot Exam shows mild collapse of the transverse arch bilaterally. Nontender on exam.  Neck: Inspection unremarkable. Mild tenderness to palpation over the left side paraspinal musculature of the cervical spine No palpable stepoffs. Negative Spurling's maneuver. Full neck range of motion Grip strength and sensation normal in bilateral hands Strength good C4 to T1 distribution No sensory change to C4 to T1 Negative Hoffman sign bilaterally Reflexes normal    osteopathic findings C2 flexed rotated and side bent right C4 flexed rotated and  side bent left T1 extended rotated and side bent left with elevated first rib  Impression and Recommendations:     This case required medical decision making of moderate complexity.

## 2015-04-29 NOTE — Progress Notes (Signed)
Pre visit review using our clinic review tool, if applicable. No additional management support is needed unless otherwise documented below in the visit note. 

## 2015-06-01 ENCOUNTER — Encounter: Payer: Self-pay | Admitting: Family Medicine

## 2015-06-01 ENCOUNTER — Ambulatory Visit (INDEPENDENT_AMBULATORY_CARE_PROVIDER_SITE_OTHER): Payer: Medicare Other | Admitting: Family Medicine

## 2015-06-01 VITALS — BP 122/76 | HR 74 | Wt 120.0 lb

## 2015-06-01 DIAGNOSIS — M1811 Unilateral primary osteoarthritis of first carpometacarpal joint, right hand: Secondary | ICD-10-CM

## 2015-06-01 DIAGNOSIS — M9901 Segmental and somatic dysfunction of cervical region: Secondary | ICD-10-CM | POA: Diagnosis not present

## 2015-06-01 DIAGNOSIS — M542 Cervicalgia: Secondary | ICD-10-CM | POA: Diagnosis not present

## 2015-06-01 DIAGNOSIS — M9908 Segmental and somatic dysfunction of rib cage: Secondary | ICD-10-CM | POA: Diagnosis not present

## 2015-06-01 DIAGNOSIS — M9902 Segmental and somatic dysfunction of thoracic region: Secondary | ICD-10-CM

## 2015-06-01 DIAGNOSIS — M189 Osteoarthritis of first carpometacarpal joint, unspecified: Secondary | ICD-10-CM | POA: Insufficient documentation

## 2015-06-01 DIAGNOSIS — M999 Biomechanical lesion, unspecified: Secondary | ICD-10-CM

## 2015-06-01 NOTE — Patient Instructions (Signed)
Good to see you  Ice is your friend Continue with the posture I am impressed Look up [employer] sport protein powder and use within 30 minutes of exercise Keep doing what you are doing For the thumb consider tylenol 325 mg 3 times a day  Arnica lotion.  See me again in 4-6 weeks

## 2015-06-01 NOTE — Assessment & Plan Note (Signed)
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 Positive grind test. I do think the patient exacerbated the underlying problem. We discussed possible bracing, over-the-counter topical anti-inflammatories, and icing. Patient will try this. Needed we will consider a more rigid brace or possible injections at follow-up.

## 2015-06-01 NOTE — Progress Notes (Signed)
[name] [address] [place], [place] [address] Phone: [phone] Subjective:     CC: Foot pain follow-up F/u neck pain    [id number] Tina [name] is a 70 y.o. female coming in with complaint of foot pain.  Patient had bilateral foot pain does seem to be more of a metatarsalgia. Patient has gotten new shoes as well as the over-the-counter orthotics and has made significant improvement. Doing the exercises occasionally. Able to walk much longer distances with less pain. Very happy with the results.  Patient was having neck pain previously. It appeared to be more secondary to scapular dysfunction. Patient was given home exercises. States that the manipulation did help for approximately 3 weeks. Over the course last week or 2 started having increasing discomfort again. Continues to try to stay active. Patient is doing yoga most days of the week.  Patient did fall while she was on her trip doing yoga. Patient cotter's over the right hand. Since then has had some mild right thumb pain. It has been 2 weeks. Slowly getting better. No numbness. No bruising. Patient has been trying some over-the-counter medications with moderate improvement.    Past Medical History  Diagnosis Date  . High triglycerides   . Hx of varicella    Past Surgical History  Procedure Laterality Date  . Cholecystectomy     Social History  Substance Use Topics  . Smoking status: Former Games developer  . Smokeless tobacco: Not on file  . Alcohol Use: 0.5 oz/week    1 drink(s) per week   No Known Allergies Family History  Problem Relation Age of Onset  . Hypertension Mother   . Hyperlipidemia Mother   . Diabetes Father   . Miscarriages / [place] Brother     Past medical history, social, surgical and family history all reviewed in electronic medical record.   Review of Systems: No headache, visual changes, nausea, vomiting, diarrhea, constipation, dizziness, abdominal pain,  skin rash, fevers, chills, night sweats, weight loss, swollen lymph nodes, body aches, joint swelling, muscle aches, chest pain, shortness of breath, mood changes.   Objective There were no vitals taken for this visit.  General: No apparent distress alert and oriented x3 mood and affect normal, dressed appropriately.  HEENT: Pupils equal, extraocular movements intact  Respiratory: Patient's speak in full sentences and does not appear short of breath  Cardiovascular: No lower extremity edema, non tender, no erythema  Skin: Warm dry intact with no signs of infection or rash on extremities or on axial skeleton.  Abdomen: Soft nontender  Neuro: Cranial nerves II through XII are intact, neurovascularly intact in all extremities with 2+ DTRs and 2+ pulses.  Lymph: No lymphadenopathy of posterior or anterior cervical chain or axillae bilaterally.  Gait normal with good balance and coordination.  MSK:  Non tender with full range of motion and good stability and symmetric strength and tone of shoulders, elbows, wrist, hip, knee and ankles bilaterally.   Hand exam shows the patient is tender to palpation over the first [facility] joint on the right side. Positive grind test. Negative de Quervain's with Finkelstein's test. No pain over the scaphoid. Full range of motion of the wrist. Contralateral wrist unremarkable.  Neck: Inspection unremarkable. Mild tenderness to palpation over the left side paraspinal musculature of the cervical spine No palpable stepoffs. Negative Spurling's maneuver. Mild limitation lacking the last 3 of extension and less 2 side bending bilaterally Grip strength and sensation normal in bilateral hands Strength  good C4 to T1 distribution No sensory change to C4 to T1 Negative Hoffman sign bilaterally Reflexes normal    osteopathic findings C2 flexed rotated and side bent right C4 flexed rotated and side bent left T1 extended rotated and side bent left with elevated first  rib T3 extended rotated and side bent right  Impression and Recommendations:     This case required medical decision making of moderate complexity.

## 2015-06-01 NOTE — Assessment & Plan Note (Signed)
Do believe the patient does have underlying arthritis as well as scapular dysfunction is likely contributing to this. Discussed with patient continue the exercises and the ergonomics and posture changes. Discussed icing. Discussed home manipulation techniques. Patient and will come back and see me again in 4-6 weeks for further evaluation and treatment.

## 2015-07-11 ENCOUNTER — Ambulatory Visit (INDEPENDENT_AMBULATORY_CARE_PROVIDER_SITE_OTHER): Payer: Medicare Other | Admitting: Family Medicine

## 2015-07-11 ENCOUNTER — Encounter: Payer: Self-pay | Admitting: Family Medicine

## 2015-07-11 VITALS — BP 118/72 | HR 81 | Ht 63.75 in | Wt 120.0 lb

## 2015-07-11 DIAGNOSIS — M9908 Segmental and somatic dysfunction of rib cage: Secondary | ICD-10-CM | POA: Diagnosis not present

## 2015-07-11 DIAGNOSIS — M9902 Segmental and somatic dysfunction of thoracic region: Secondary | ICD-10-CM | POA: Diagnosis not present

## 2015-07-11 DIAGNOSIS — M1811 Unilateral primary osteoarthritis of first carpometacarpal joint, right hand: Secondary | ICD-10-CM

## 2015-07-11 DIAGNOSIS — M9901 Segmental and somatic dysfunction of cervical region: Secondary | ICD-10-CM

## 2015-07-11 DIAGNOSIS — M999 Biomechanical lesion, unspecified: Secondary | ICD-10-CM

## 2015-07-11 DIAGNOSIS — M542 Cervicalgia: Secondary | ICD-10-CM

## 2015-07-11 NOTE — Assessment & Plan Note (Signed)
Decision today to treat with OMT was based on Physical Exam  After verbal consent patient was treated with HVLA, ME, FPr techniques in cervical, thoracic and rib areas  Patient tolerated the procedure well with improvement in symptoms  Patient given exercises, stretches and lifestyle modifications  See medications in patient instructions if given  Patient will follow up in 8 weeks

## 2015-07-11 NOTE — Progress Notes (Signed)
[name] [address] [place], [place] [address] Phone: [phone] Subjective:     CC: Foot pain follow-up F/u neck pain    [id number] Tina [name] is a 70 y.o. female coming in with complaint of foot pain.  Patient had bilateral foot pain does seem to be more of a metatarsalgia. Patient is been wearing the rocker bottom shoes on a more regular basis and states that she is not having any significant pain. States when his: Out she does have some mild pain. Nothing that is stopping her from activities.  Patient was having neck pain previously. It appeared to be more secondary to scapular dysfunction.underlying arthritis as well. Did respond well to osteopathic manipulation. Doing the home exercises and going to a gym on a more regular basis. Overall she does think she's improving. Some mild tightness overall. No radiation down the arm or any numbness or weakness.  Patient is also bilateral thumb pain. Been diagnosed with [facility] arthritis. Seems to be doing well unless it is cold outside. Not having any significant pain with repetitive activity but states that she Has to use strength she has some discomfort    Past Medical History  Diagnosis Date  . High triglycerides   . Hx of varicella    Past Surgical History  Procedure Laterality Date  . Cholecystectomy     Social History  Substance Use Topics  . Smoking status: Former Games developer  . Smokeless tobacco: None  . Alcohol Use: 0.5 oz/week    1 drink(s) per week   No Known Allergies Family History  Problem Relation Age of Onset  . Hypertension Mother   . Hyperlipidemia Mother   . Diabetes Father   . Miscarriages / [place] Brother     Past medical history, social, surgical and family history all reviewed in electronic medical record.   Review of Systems: No headache, visual changes, nausea, vomiting, diarrhea, constipation, dizziness, abdominal pain, skin rash, fevers, chills, night sweats,  weight loss, swollen lymph nodes, body aches, joint swelling, muscle aches, chest pain, shortness of breath, mood changes.   Objective Blood pressure 118/72, pulse 81, height 5' 3.75" (1.619 m), weight 120 lb (54.432 kg), SpO2 97 %.  General: No apparent distress alert and oriented x3 mood and affect normal, dressed appropriately.  HEENT: Pupils equal, extraocular movements intact  Respiratory: Patient's speak in full sentences and does not appear short of breath  Cardiovascular: No lower extremity edema, non tender, no erythema  Skin: Warm dry intact with no signs of infection or rash on extremities or on axial skeleton.  Abdomen: Soft nontender  Neuro: Cranial nerves II through XII are intact, neurovascularly intact in all extremities with 2+ DTRs and 2+ pulses.  Lymph: No lymphadenopathy of posterior or anterior cervical chain or axillae bilaterally.  Gait normal with good balance and coordination.  MSK:  Non tender with full range of motion and good stability and symmetric strength and tone of shoulders, elbows, wrist, hip, knee and ankles bilaterally.   Hand exam shows the patient is tender to palpation over the first [facility] joint on the right side. Positive grind test. Positive grind test on the left side as well but very minimal pain overall.  Neck: Inspection unremarkable. Mild tenderness to palpation over the left side paraspinal musculature of the cervical spine No palpable stepoffs. Negative Spurling's maneuver. Mild limitation lacking the last 3 of extension and less 2 side bending bilaterallyno significant change Grip strength and sensation normal in  bilateral hands Strength good C4 to T1 distribution No sensory change to C4 to T1 Negative Hoffman sign bilaterally Reflexes normal Does have some mild in improvement in    osteopathic findings C2 flexed rotated and side bent right T1 extended rotated and side bent left with elevated first rib T3 extended rotated and side bent  right  Impression and Recommendations:     This case required medical decision making of moderate complexity.

## 2015-07-11 NOTE — Patient Instructions (Signed)
Good to see you  Go shoe shopping I love what you are doing Consider tylenol 3 times a day can help Continue the vitaminD Continue the turmeric See me again in 8 weeks.

## 2015-07-11 NOTE — Assessment & Plan Note (Signed)
Continue to monitor. If worsening symptoms we'll consider injections. Discussed topical anti-inflammatories and patient given a sample.

## 2015-07-11 NOTE — Progress Notes (Signed)
Pre visit review using our clinic review tool, if applicable. No additional management support is needed unless otherwise documented below in the visit note. 

## 2015-07-11 NOTE — Assessment & Plan Note (Signed)
Neck pain is likely from underlying arthritis. We'll continue to monitor. Patient will continue to work on her regular basis. We did discuss over-the-counter natural supplementation the can be helpful. Patient will continue someone. Patient and will see me again in 8 weeks for further evaluation. Continues to respond well to osteopathic manipulation.

## 2015-07-12 ENCOUNTER — Other Ambulatory Visit (INDEPENDENT_AMBULATORY_CARE_PROVIDER_SITE_OTHER): Payer: Medicare Other

## 2015-07-12 DIAGNOSIS — Z0000 Encounter for general adult medical examination without abnormal findings: Secondary | ICD-10-CM

## 2015-07-12 LAB — LIPID PANEL
CHOL/HDL RATIO: 2
Cholesterol: 196 mg/dL (ref 0–200)
HDL: 80.8 mg/dL (ref >39.00)
LDL Cholesterol: 104 mg/dL — ABNORMAL HIGH (ref 0–99)
NONHDL: 115.15
Triglycerides: 54 mg/dL (ref 0.0–149.0)
VLDL: 10.8 mg/dL (ref 0.0–40.0)

## 2015-07-12 LAB — CBC WITH DIFFERENTIAL/PLATELET
BASOS ABS: 0 10*3/uL (ref 0.0–0.1)
Basophils Relative: 0.7 % (ref 0.0–3.0)
Eosinophils Absolute: 0.1 10*3/uL (ref 0.0–0.7)
Eosinophils Relative: 2.9 % (ref 0.0–5.0)
HEMATOCRIT: 40.1 % (ref 36.0–46.0)
HEMOGLOBIN: 13.5 g/dL (ref 12.0–15.0)
LYMPHS PCT: 43.3 % (ref 12.0–46.0)
Lymphs Abs: 2.1 10*3/uL (ref 0.7–4.0)
MCHC: 33.7 g/dL (ref 30.0–36.0)
MCV: 87.9 fl (ref 78.0–100.0)
MONOS PCT: 7 % (ref 3.0–12.0)
Monocytes Absolute: 0.3 10*3/uL (ref 0.1–1.0)
NEUTROS ABS: 2.2 10*3/uL (ref 1.4–7.7)
Neutrophils Relative %: 46.1 % (ref 43.0–77.0)
PLATELETS: 279 10*3/uL (ref 150.0–400.0)
RBC: 4.57 Mil/uL (ref 3.87–5.11)
RDW: 14.1 % (ref 11.5–15.5)
WBC: 4.8 10*3/uL (ref 4.0–10.5)

## 2015-07-12 LAB — MICROALBUMIN / CREATININE URINE RATIO
CREATININE, U: 149.9 mg/dL
MICROALB UR: 0.7 mg/dL (ref 0.0–1.9)
MICROALB/CREAT RATIO: 0.5 mg/g (ref 0.0–30.0)

## 2015-07-12 LAB — HEPATIC FUNCTION PANEL
ALBUMIN: 4.2 g/dL (ref 3.5–5.2)
ALK PHOS: 64 U/L (ref 39–117)
ALT: 23 U/L (ref 0–35)
AST: 27 U/L (ref 0–37)
BILIRUBIN DIRECT: 0.1 mg/dL (ref 0.0–0.3)
TOTAL PROTEIN: 6.9 g/dL (ref 6.0–8.3)
Total Bilirubin: 0.7 mg/dL (ref 0.2–1.2)

## 2015-07-12 LAB — BASIC METABOLIC PANEL
BUN: 20 mg/dL (ref 6–23)
CALCIUM: 9.5 mg/dL (ref 8.4–10.5)
CHLORIDE: 104 meq/L (ref 96–112)
CO2: 27 meq/L (ref 19–32)
Creatinine, Ser: 0.83 mg/dL (ref 0.40–1.20)
GFR: 72.55 mL/min (ref >60.00)
Glucose, Bld: 107 mg/dL — ABNORMAL HIGH (ref 70–99)
Potassium: 3.7 mEq/L (ref 3.5–5.1)
SODIUM: 140 meq/L (ref 135–145)

## 2015-07-12 LAB — TSH: TSH: 3.52 u[IU]/mL (ref 0.35–4.50)

## 2015-07-12 LAB — HEMOGLOBIN A1C: Hgb A1c MFr Bld: 5.9 % (ref 4.6–6.5)

## 2015-07-18 NOTE — Progress Notes (Signed)
Chief Complaint  Patient presents with  . Annual Exam    HPI: Patient  Tina [name]  69 y.o. comes in today for Preventive Health Care visit  Medicare   Retired this year .  No major change in health but   Exercising and eating better .  On niacin for a while originally for elevated TG.    Wonders if can try off   Health Maintenance  Topic Date Due  . Hepatitis C Screening  01-Dec-1945  . FOOT EXAM  12/27/1956  . OPHTHALMOLOGY EXAM  12/27/1956  . INFLUENZA VACCINE  11/22/2015  . HEMOGLOBIN A1C  01/12/2016  . TETANUS/TDAP  03/18/2016  . URINE MICROALBUMIN  07/11/2016  . MAMMOGRAM  08/30/2016  . COLONOSCOPY  09/13/2022  . DEXA SCAN  Completed  . ZOSTAVAX  Completed  . PNA vac Low Risk Adult  Completed   Health Maintenance Review LIFESTYLE:  Exercise:   Yoga  silver  Sneakers  Tobacco/ETS: no Alcohol:   Per week  3 Sugar beverages: no Sleep: 7-8 Drug use: no1`  ROS:  GEN/ HEENT: No fever, significant weight changes sweats headaches vision problems hearing changes, CV/ PULM; No chest pain shortness of breath cough, syncope,edema  change in exercise tolerance. GI /GU: No adominal pain, vomiting, change in bowel habits. No blood in the stool. No significant GU symptoms. SKIN/HEME: ,no acute skin rashes suspicious lesions or bleeding. No lymphadenopathy, nodules, masses.  NEURO/ PSYCH:  No neurologic signs such as weakness numbness. No depression anxiety. IMM/ Allergy: No unusual infections.  Allergy .   REST of 12 system review negative except as per HPI   Past Medical History  Diagnosis Date  . High triglycerides   . Hx of varicella     Past Surgical History  Procedure Laterality Date  . Cholecystectomy      Family History  Problem Relation Age of Onset  . Hypertension Mother   . Hyperlipidemia Mother   . Diabetes Father   . Miscarriages / [place] Brother     Social History   Social History  . Marital Status: Divorced    Spouse Name: N/A  . Number  of Children: N/A  . 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 Smoking status: Former Research scientist (life sciences)  . Smokeless tobacco: None  . Alcohol Use: 0.5 oz/week    1 drink(s) per week  . Drug Use: No  . Sexual Activity: Not Asked   Other Topics Concern  . None   Social History Narrative    Now [facility] no pets    .    Mother  Moved down to [place] home        Divorced one chid and grandchild   Neg ets firearms.    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    Retire this summer 17   Educ [place] leave.  This semester.    Exercises regularly      Neg tad     Outpatient Prescriptions Prior to Visit  Medication Sig Dispense Refill  . cetirizine (ZYRTEC) 10 MG tablet Take 10 mg by mouth daily.    . cycloSPORINE (RESTASIS) 0.05 % ophthalmic emulsion 1 drop 2 (two) times daily. Valetta Close, MD gives this to pt.    . MULTIPLE VITAMIN PO Take by mouth.      . niacin (NIASPAN) 500 MG CR tablet Take 500  mg by mouth at bedtime.    . Omega-3 Fatty Acids (OMEGA 3 PO) Take by mouth.       No facility-administered medications prior to visit.     EXAM:  BP 140/80 mmHg  Pulse 71  Temp(Src) 98.3 F (36.8 C) (Oral)  Ht 5' 3.58" (1.615 m)  Wt 118 lb 1.6 oz (53.57 kg)  BMI 20.54 kg/m2  Body mass index is 20.54 kg/(m^2).  Physical Exam: Vital signs reviewed NTZ:GYFV is a well-developed well-nourished alert cooperative    who appearsr stated age in no acute distress.  HEENT: normocephalic atraumatic , Eyes: PERRL EOM's full, conjunctiva clear, Nares: paten,t no deformity discharge or tenderness., Ears: no deformity EAC's clear TMs with normal landmarks. Mouth: clear OP, no lesions, edema.  Moist mucous membranes. Dentition in adequate repair. NECK: supple without masses, thyromegaly or bruits. CHEST/PULM:  Clear to auscultation and percussion breath sounds equal no wheeze , rales or rhonchi. No chest wall deformities or  tenderness.Breast: normal by inspection . No dimpling, discharge, masses, tenderness or discharge . CV: PMI is nondisplaced, S1 S2 no gallops, murmurs, rubs. Peripheral pulses are full without delay.No JVD .  ABDOMEN: Bowel sounds normal nontender  No guard or rebound, no hepato splenomegal no CVA tenderness.  . Extremtities:  No clubbing cyanosis or edema, no acute joint swelling or redness no focal atrophy NEURO:  Oriented x3, cranial nerves 3-12 appear to be intact, no obvious focal weakness,gait within normal limits no abnormal reflexes or asymmetrical SKIN: No acute rashes normal turgor, color, no bruising or petechiae. PSYCH: Oriented, good eye contact, no obvious depression anxiety, cognition and judgment appear normal. LN: no cervical axillary inguinal adenopathy  Lab Results  Component Value Date   WBC 4.8 07/12/2015   HGB 13.5 07/12/2015   HCT 40.1 07/12/2015   PLT 279.0 07/12/2015   GLUCOSE 107* 07/12/2015   CHOL 196 07/12/2015   TRIG 54.0 07/12/2015   HDL 80.80 07/12/2015   LDLDIRECT 137.3 07/02/2012   LDLCALC 104* 07/12/2015   ALT 23 07/12/2015   AST 27 07/12/2015   NA 140 07/12/2015   K 3.7 07/12/2015   CL 104 07/12/2015   CREATININE 0.83 07/12/2015   BUN 20 07/12/2015   CO2 27 07/12/2015   TSH 3.52 07/12/2015   HGBA1C 5.9 07/12/2015   MICROALBUR 0.7 07/12/2015    ASSESSMENT AND PLAN:  Discussed the following assessment and plan:  Visit for preventive health examination  Hyperlipidemia, mixed - excelent on niaspan orig for ele tg ok to try off and follow and see if hyperglycemia resolves   Impaired fasting blood sugar - excellent ls  follow and try off of niacin  Patient Care Team: Burnis Medin, MD as PCP - General (Internal Medicine) Crista Luria, MD as Attending Physician (Dermatology) Dyke Maes, [place] (Optometry) Patient Instructions  Ok to try  Off the niaspan since  You  Have changed your life style  Continue lifestyle intervention healthy  eating and exercise . Plan fasting lipids bmp and hga1c and ROV   in about 4 months after stopping    Health Maintenance, Female Adopting a healthy lifestyle and getting preventive care can go a long way to promote health and wellness. Talk with your health care provider about what schedule of regular examinations is right for you. This is a good chance for you to check in with your provider about disease prevention and staying healthy. In between checkups, there are plenty of things you can do on your own.  Experts have done a lot of research about which lifestyle changes and preventive measures are most likely to keep you healthy. Ask your health care provider for more information. WEIGHT AND DIET  Eat a healthy diet  Be sure to include plenty of vegetables, fruits, low-fat dairy products, and lean protein.  Do not eat a lot of foods high in solid fats, added sugars, or salt.  Get regular exercise. This is one of the most important things you can do for your health.  Most adults should exercise for at least 150 minutes each week. The exercise should increase your heart rate and make you sweat (moderate-intensity exercise).  Most adults should also do strengthening exercises at least twice a week. This is in addition to the moderate-intensity exercise.  Maintain a healthy weight  Body mass index (BMI) is a measurement that can be used to identify possible weight problems. It estimates body fat based on height and weight. Your health care provider can help determine your BMI and help you achieve or maintain a healthy weight.  For females 29 years of age and older:   A BMI below 18.5 is considered underweight.  A BMI of 18.5 to 24.9 is normal.  A BMI of 25 to 29.9 is considered overweight.  A BMI of 30 and above is considered obese.  Watch levels of cholesterol and blood lipids  You should start having your blood tested for lipids and cholesterol at 69 years of age, then have this  test every 5 years.  You may need to have your cholesterol levels checked more often if:  Your lipid or cholesterol levels are high.  You are older than 69 years of age.  You are at high risk for heart disease.  CANCER SCREENING   Lung Cancer  Lung cancer screening is recommended for adults 49-20 years old who are at high risk for lung cancer because of a history of smoking.  A yearly low-dose CT scan of the lungs is recommended for people who:  Currently smoke.  Have quit within the past 15 years.  Have at least a 30-pack-year history of smoking. A pack year is smoking an average of one pack of cigarettes a day for 1 year.  Yearly screening should continue until it has been 15 years since you quit.  Yearly screening should stop if you develop a health problem that would prevent you from having lung cancer treatment.  Breast Cancer  Practice breast self-awareness. This means understanding how your breasts normally appear and feel.  It also means doing regular breast self-exams. Let your health care provider know about any changes, no matter how small.  If you are in your 20s or 30s, you should have a clinical breast exam (CBE) by a health care provider every 1-3 years as part of a regular health exam.  If you are 61 or older, have a CBE every year. Also consider having a breast X-ray (mammogram) every year.  If you have a family history of breast cancer, talk to your health care provider about genetic screening.  If you are at high risk for breast cancer, talk to your health care provider about having an MRI and a mammogram every year.  Breast cancer gene (BRCA) assessment is recommended for women who have family members with BRCA-related cancers. BRCA-related cancers include:  Breast.  Ovarian.  Tubal.  Peritoneal cancers.  Results of the assessment will determine the need for genetic counseling and BRCA1 and BRCA2 testing. Cervical Cancer  Your health care  provider may recommend that you be screened regularly for cancer of the pelvic organs (ovaries, uterus, and vagina). This screening involves a pelvic examination, including checking for microscopic changes to the surface of your cervix (Pap test). You may be encouraged to have this screening done every 3 years, beginning at age 93.  For women ages 6-65, health care providers may recommend pelvic exams and Pap testing every 3 years, or they may recommend the Pap and pelvic exam, combined with testing for human papilloma virus (HPV), every 5 years. Some types of HPV increase your risk of cervical cancer. Testing for HPV may also be done on women of any age with unclear Pap test results.  Other health care providers may not recommend any screening for nonpregnant women who are considered low risk for pelvic cancer and who do not have symptoms. Ask your health care provider if a screening pelvic exam is right for you.  If you have had past treatment for cervical cancer or a condition that could lead to cancer, you need Pap tests and screening for cancer for at least 20 years after your treatment. If Pap tests have been discontinued, your risk factors (such as having a new sexual partner) need to be reassessed to determine if screening should resume. Some women have medical problems that increase the chance of getting cervical cancer. In these cases, your health care provider may recommend more frequent screening and Pap tests. Colorectal Cancer  This type of cancer can be detected and often prevented.  Routine colorectal cancer screening usually begins at 69 years of age and continues through 69 years of age.  Your health care provider may recommend screening at an earlier age if you have risk factors for colon cancer.  Your health care provider may also recommend using home test kits to check for hidden blood in the stool.  A small camera at the end of a tube can be used to examine your colon directly  (sigmoidoscopy or colonoscopy). This is done to check for the earliest forms of colorectal cancer.  Routine screening usually begins at age 58.  Direct examination of the colon should be repeated every 5-10 years through 69 years of age. However, you may need to be screened more often if early forms of precancerous polyps or small growths are found. Skin Cancer  Check your skin from head to toe regularly.  Tell your health care provider about any new moles or changes in moles, especially if there is a change in a mole's shape or color.  Also tell your health care provider if you have a mole that is larger than the size of a pencil eraser.  Always use sunscreen. Apply sunscreen liberally and repeatedly throughout the day.  Protect yourself by wearing long sleeves, pants, a wide-brimmed hat, and sunglasses whenever you are outside. HEART DISEASE, DIABETES, AND HIGH BLOOD PRESSURE   High blood pressure causes heart disease and increases the risk of stroke. High blood pressure is more likely to develop in:  People who have blood pressure in the high end of the normal range (130-139/85-89 mm Hg).  People who are overweight or obese.  People who are African American.  If you are 9-30 years of age, have your blood pressure checked every 3-5 years. If you are 25 years of age or older, have your blood pressure checked every year. You should have your blood pressure measured twice--once when you are at a hospital or clinic, and once  when you are not at a hospital or clinic. Record the average of the two measurements. To check your blood pressure when you are not at a hospital or clinic, you can use:  An automated blood pressure machine at a pharmacy.  A home blood pressure monitor.  If you are between 76 years and 65 years old, ask your health care provider if you should take aspirin to prevent strokes.  Have regular diabetes screenings. This involves taking a blood sample to check your  fasting blood sugar level.  If you are at a normal weight and have a low risk for diabetes, have this test once every three years after 69 years of age.  If you are overweight and have a high risk for diabetes, consider being tested at a younger age or more often. PREVENTING INFECTION  Hepatitis B  If you have a higher risk for hepatitis B, you should be screened for this virus. You are considered at high risk for hepatitis B if:  You were born in a country where hepatitis B is common. Ask your health care provider which countries are considered high risk.  Your parents were born in a high-risk country, and you have not been immunized against hepatitis B (hepatitis B vaccine).  You have HIV or AIDS.  You use needles to inject street drugs.  You live with someone who has hepatitis B.  You have had sex with someone who has hepatitis B.  You get hemodialysis treatment.  You take certain medicines for conditions, including cancer, organ transplantation, and autoimmune conditions. Hepatitis C  Blood testing is recommended for:  Everyone born from [birth year] through 1964.  Anyone with known risk factors for hepatitis C. Sexually transmitted infections (STIs)  You should be screened for sexually transmitted infections (STIs) including gonorrhea and chlamydia if:  You are sexually active and are younger than 69 years of age.  You are older than 69 years of age and your health care provider tells you that you are at risk for this type of infection.  Your sexual activity has changed since you were last screened and you are at an increased risk for chlamydia or gonorrhea. Ask your health care provider if you are at risk.  If you do not have HIV, but are at risk, it may be recommended that you take a prescription medicine daily to prevent HIV infection. This is called pre-exposure prophylaxis (PrEP). You are considered at risk if:  You are sexually active and do not regularly use condoms or  know the HIV status of your partner(s).  You take drugs by injection.  You are sexually active with a partner who has HIV. Talk with your health care provider about whether you are at high risk of being infected with HIV. If you choose to begin PrEP, you should first be tested for HIV. You should then be tested every 3 months for as long as you are taking PrEP.  PREGNANCY   If you are premenopausal and you may become pregnant, ask your health care provider about preconception counseling.  If you may become pregnant, take 400 to 800 micrograms (mcg) of folic acid every day.  If you want to prevent pregnancy, talk to your health care provider about birth control (contraception). OSTEOPOROSIS AND MENOPAUSE   Osteoporosis is a disease in which the bones lose minerals and strength with aging. This can result in serious bone fractures. Your risk for osteoporosis can be identified using a bone density scan.  If  you are 77 years of age or older, or if you are at risk for osteoporosis and fractures, ask your health care provider if you should be screened.  Ask your health care provider whether you should take a calcium or vitamin D supplement to lower your risk for osteoporosis.  Menopause may have certain physical symptoms and risks.  Hormone replacement therapy may reduce some of these symptoms and risks. Talk to your health care provider about whether hormone replacement therapy is right for you.  HOME CARE INSTRUCTIONS   Schedule regular health, dental, and eye exams.  Stay current with your immunizations.   Do not use any tobacco products including cigarettes, chewing tobacco, or electronic cigarettes.  If you are pregnant, do not drink alcohol.  If you are breastfeeding, limit how much and how often you drink alcohol.  Limit alcohol intake to no more than 1 drink per day for nonpregnant women. One drink equals 12 ounces of beer, 5 ounces of wine, or 1 ounces of hard liquor.  Do  not use street drugs.  Do not share needles.  Ask your health care provider for help if you need support or information about quitting drugs.  Tell your health care provider if you often feel depressed.  Tell your health care provider if you have ever been abused or do not feel safe at home.   This information is not intended to replace advice given to you by your health care provider. Make sure you discuss any questions you have with your health care provider.   Document Released: 10/23/2010 Document Revised: 04/30/2014 Document Reviewed: 03/11/2013 Elsevier Interactive Patient Education 2016 [place] K. Katora Fini M.D.

## 2015-07-18 NOTE — Patient Instructions (Addendum)
Ok to try  Off the niaspan since  You  Have changed your life style  Continue lifestyle intervention healthy eating and exercise . Plan fasting lipids bmp and hga1c and ROV   in about 4 months after stopping    Health Maintenance, Female Adopting a healthy lifestyle and getting preventive care can go a long way to promote health and wellness. Talk with your health care provider about what schedule of regular examinations is right for you. This is a good chance for you to check in with your provider about disease prevention and staying healthy. In between checkups, there are plenty of things you can do on your own. Experts have done a lot of research about which lifestyle changes and preventive measures are most likely to keep you healthy. Ask your health care provider for more information. WEIGHT AND DIET  Eat a healthy diet  Be sure to include plenty of vegetables, fruits, low-fat dairy products, and lean protein.  Do not eat a lot of foods high in solid fats, added sugars, or salt.  Get regular exercise. This is one of the most important things you can do for your health.  Most adults should exercise for at least 150 minutes each week. The exercise should increase your heart rate and make you sweat (moderate-intensity exercise).  Most adults should also do strengthening exercises at least twice a week. This is in addition to the moderate-intensity exercise.  Maintain a healthy weight  Body mass index (BMI) is a measurement that can be used to identify possible weight problems. It estimates body fat based on height and weight. Your health care provider can help determine your BMI and help you achieve or maintain a healthy weight.  For females 69 years of age and older:   A BMI below 18.5 is considered underweight.  A BMI of 18.5 to 24.9 is normal.  A BMI of 25 to 29.9 is considered overweight.  A BMI of 30 and above is considered obese.  Watch levels of cholesterol and blood  lipids  You should start having your blood tested for lipids and cholesterol at 69 years of age, then have this test every 5 years.  You may need to have your cholesterol levels checked more often if:  Your lipid or cholesterol levels are high.  You are older than 69 years of age.  You are at high risk for heart disease.  CANCER SCREENING   Lung Cancer  Lung cancer screening is recommended for adults 62-30 years old who are at high risk for lung cancer because of a history of smoking.  A yearly low-dose CT scan of the lungs is recommended for people who:  Currently smoke.  Have quit within the past 15 years.  Have at least a 30-pack-year history of smoking. A pack year is smoking an average of one pack of cigarettes a day for 1 year.  Yearly screening should continue until it has been 15 years since you quit.  Yearly screening should stop if you develop a health problem that would prevent you from having lung cancer treatment.  Breast Cancer  Practice breast self-awareness. This means understanding how your breasts normally appear and feel.  It also means doing regular breast self-exams. Let your health care provider know about any changes, no matter how small.  If you are in your 20s or 30s, you should have a clinical breast exam (CBE) by a health care provider every 1-3 years as part of a regular health  exam.  If you are 40 or older, have a CBE every year. Also consider having a breast X-ray (mammogram) every year.  If you have a family history of breast cancer, talk to your health care provider about genetic screening.  If you are at high risk for breast cancer, talk to your health care provider about having an MRI and a mammogram every year.  Breast cancer gene (BRCA) assessment is recommended for women who have family members with BRCA-related cancers. BRCA-related cancers include:  Breast.  Ovarian.  Tubal.  Peritoneal cancers.  Results of the assessment  will determine the need for genetic counseling and BRCA1 and BRCA2 testing. Cervical Cancer Your health care provider may recommend that you be screened regularly for cancer of the pelvic organs (ovaries, uterus, and vagina). This screening involves a pelvic examination, including checking for microscopic changes to the surface of your cervix (Pap test). You may be encouraged to have this screening done every 3 years, beginning at age 20.  For women ages 31-65, health care providers may recommend pelvic exams and Pap testing every 3 years, or they may recommend the Pap and pelvic exam, combined with testing for human papilloma virus (HPV), every 5 years. Some types of HPV increase your risk of cervical cancer. Testing for HPV may also be done on women of any age with unclear Pap test results.  Other health care providers may not recommend any screening for nonpregnant women who are considered low risk for pelvic cancer and who do not have symptoms. Ask your health care provider if a screening pelvic exam is right for you.  If you have had past treatment for cervical cancer or a condition that could lead to cancer, you need Pap tests and screening for cancer for at least 20 years after your treatment. If Pap tests have been discontinued, your risk factors (such as having a new sexual partner) need to be reassessed to determine if screening should resume. Some women have medical problems that increase the chance of getting cervical cancer. In these cases, your health care provider may recommend more frequent screening and Pap tests. Colorectal Cancer  This type of cancer can be detected and often prevented.  Routine colorectal cancer screening usually begins at 69 years of age and continues through 69 years of age.  Your health care provider may recommend screening at an earlier age if you have risk factors for colon cancer.  Your health care provider may also recommend using home test kits to check  for hidden blood in the stool.  A small camera at the end of a tube can be used to examine your colon directly (sigmoidoscopy or colonoscopy). This is done to check for the earliest forms of colorectal cancer.  Routine screening usually begins at age 15.  Direct examination of the colon should be repeated every 5-10 years through 69 years of age. However, you may need to be screened more often if early forms of precancerous polyps or small growths are found. Skin Cancer  Check your skin from head to toe regularly.  Tell your health care provider about any new moles or changes in moles, especially if there is a change in a mole's shape or color.  Also tell your health care provider if you have a mole that is larger than the size of a pencil eraser.  Always use sunscreen. Apply sunscreen liberally and repeatedly throughout the day.  Protect yourself by wearing long sleeves, pants, a wide-brimmed hat, and sunglasses  whenever you are outside. HEART DISEASE, DIABETES, AND HIGH BLOOD PRESSURE   High blood pressure causes heart disease and increases the risk of stroke. High blood pressure is more likely to develop in:  People who have blood pressure in the high end of the normal range (130-139/85-89 mm Hg).  People who are overweight or obese.  People who are African American.  If you are 24-48 years of age, have your blood pressure checked every 3-5 years. If you are 77 years of age or older, have your blood pressure checked every year. You should have your blood pressure measured twice--once when you are at a hospital or clinic, and once when you are not at a hospital or clinic. Record the average of the two measurements. To check your blood pressure when you are not at a hospital or clinic, you can use:  An automated blood pressure machine at a pharmacy.  A home blood pressure monitor.  If you are between 23 years and 63 years old, ask your health care provider if you should take  aspirin to prevent strokes.  Have regular diabetes screenings. This involves taking a blood sample to check your fasting blood sugar level.  If you are at a normal weight and have a low risk for diabetes, have this test once every three years after 69 years of age.  If you are overweight and have a high risk for diabetes, consider being tested at a younger age or more often. PREVENTING INFECTION  Hepatitis B  If you have a higher risk for hepatitis B, you should be screened for this virus. You are considered at high risk for hepatitis B if:  You were born in a country where hepatitis B is common. Ask your health care provider which countries are considered high risk.  Your parents were born in a high-risk country, and you have not been immunized against hepatitis B (hepatitis B vaccine).  You have HIV or AIDS.  You use needles to inject street drugs.  You live with someone who has hepatitis B.  You have had sex with someone who has hepatitis B.  You get hemodialysis treatment.  You take certain medicines for conditions, including cancer, organ transplantation, and autoimmune conditions. Hepatitis C  Blood testing is recommended for:  Everyone born from [birth year] through 1964.  Anyone with known risk factors for hepatitis C. Sexually transmitted infections (STIs)  You should be screened for sexually transmitted infections (STIs) including gonorrhea and chlamydia if:  You are sexually active and are younger than 69 years of age.  You are older than 69 years of age and your health care provider tells you that you are at risk for this type of infection.  Your sexual activity has changed since you were last screened and you are at an increased risk for chlamydia or gonorrhea. Ask your health care provider if you are at risk.  If you do not have HIV, but are at risk, it may be recommended that you take a prescription medicine daily to prevent HIV infection. This is called  pre-exposure prophylaxis (PrEP). You are considered at risk if:  You are sexually active and do not regularly use condoms or know the HIV status of your partner(s).  You take drugs by injection.  You are sexually active with a partner who has HIV. Talk with your health care provider about whether you are at high risk of being infected with HIV. If you choose to begin PrEP, you should first be  tested for HIV. You should then be tested every 3 months for as long as you are taking PrEP.  PREGNANCY   If you are premenopausal and you may become pregnant, ask your health care provider about preconception counseling.  If you may become pregnant, take 400 to 800 micrograms (mcg) of folic acid every day.  If you want to prevent pregnancy, talk to your health care provider about birth control (contraception). OSTEOPOROSIS AND MENOPAUSE   Osteoporosis is a disease in which the bones lose minerals and strength with aging. This can result in serious bone fractures. Your risk for osteoporosis can be identified using a bone density scan.  If you are 14 years of age or older, or if you are at risk for osteoporosis and fractures, ask your health care provider if you should be screened.  Ask your health care provider whether you should take a calcium or vitamin D supplement to lower your risk for osteoporosis.  Menopause may have certain physical symptoms and risks.  Hormone replacement therapy may reduce some of these symptoms and risks. Talk to your health care provider about whether hormone replacement therapy is right for you.  HOME CARE INSTRUCTIONS   Schedule regular health, dental, and eye exams.  Stay current with your immunizations.   Do not use any tobacco products including cigarettes, chewing tobacco, or electronic cigarettes.  If you are pregnant, do not drink alcohol.  If you are breastfeeding, limit how much and how often you drink alcohol.  Limit alcohol intake to no more than 1  drink per day for nonpregnant women. One drink equals 12 ounces of beer, 5 ounces of wine, or 1 ounces of hard liquor.  Do not use street drugs.  Do not share needles.  Ask your health care provider for help if you need support or information about quitting drugs.  Tell your health care provider if you often feel depressed.  Tell your health care provider if you have ever been abused or do not feel safe at home.   This information is not intended to replace advice given to you by your health care provider. Make sure you discuss any questions you have with your health care provider.   Document Released: 10/23/2010 Document Revised: 04/30/2014 Document Reviewed: 03/11/2013 Elsevier Interactive Patient Education [employer].

## 2015-07-19 ENCOUNTER — Ambulatory Visit (INDEPENDENT_AMBULATORY_CARE_PROVIDER_SITE_OTHER): Payer: Medicare Other | Admitting: Internal Medicine

## 2015-07-19 ENCOUNTER — Encounter: Payer: Self-pay | Admitting: Internal Medicine

## 2015-07-19 VITALS — BP 140/80 | HR 71 | Temp 98.3°F | Ht 63.58 in | Wt 118.1 lb

## 2015-07-19 DIAGNOSIS — Z0000 Encounter for general adult medical examination without abnormal findings: Secondary | ICD-10-CM | POA: Diagnosis not present

## 2015-07-19 DIAGNOSIS — E782 Mixed hyperlipidemia: Secondary | ICD-10-CM | POA: Diagnosis not present

## 2015-07-19 DIAGNOSIS — R7301 Impaired fasting glucose: Secondary | ICD-10-CM

## 2015-07-19 NOTE — Progress Notes (Signed)
Pre visit review using our clinic review tool, if applicable. No additional management support is needed unless otherwise documented below in the visit note. 

## 2015-09-06 ENCOUNTER — Ambulatory Visit (INDEPENDENT_AMBULATORY_CARE_PROVIDER_SITE_OTHER): Payer: Medicare Other | Admitting: Family Medicine

## 2015-09-06 ENCOUNTER — Encounter: Payer: Self-pay | Admitting: Family Medicine

## 2015-09-06 VITALS — BP 116/80 | HR 81 | Ht 63.58 in | Wt 117.0 lb

## 2015-09-06 DIAGNOSIS — M9902 Segmental and somatic dysfunction of thoracic region: Secondary | ICD-10-CM

## 2015-09-06 DIAGNOSIS — M9908 Segmental and somatic dysfunction of rib cage: Secondary | ICD-10-CM | POA: Diagnosis not present

## 2015-09-06 DIAGNOSIS — M9901 Segmental and somatic dysfunction of cervical region: Secondary | ICD-10-CM | POA: Diagnosis not present

## 2015-09-06 DIAGNOSIS — M542 Cervicalgia: Secondary | ICD-10-CM | POA: Diagnosis not present

## 2015-09-06 DIAGNOSIS — M999 Biomechanical lesion, unspecified: Secondary | ICD-10-CM

## 2015-09-06 NOTE — Progress Notes (Signed)
  Tawana ScaleZach Kallen Mccrystal D.O. [facility] [address] [place], [place] [address] Phone: [phone] Subjective:     CC: Foot pain follow-up F/u neck pain    BJY:NWGNFAOZHYHPI:Subjective Tina [name] is a 70 y.o. female coming in with complaint of neck pain   Patient was having neck pain previously. It appeared to be more secondary to scapular dysfunction.underlying arthritis as well. Did respond well to osteopathic manipulation.  Has been trying to do the exercises regularly but has had some increasing stress recently with her  Father following and breaking normal left hip.  Been taking care of  Him as well. Because of this has had more pain on left side in the last week of neck, no pain down the arm.      Past Medical History  Diagnosis Date  . High triglycerides   . Hx of varicella    Past Surgical History  Procedure Laterality Date  . Cholecystectomy     Social History  Substance Use Topics  . Smoking status: Former Games developermoker  . Smokeless tobacco: None  . Alcohol Use: 0.5 oz/week    1 drink(s) per week   No Known Allergies Family History  Problem Relation Age of Onset  . Hypertension Mother   . Hyperlipidemia Mother   . Diabetes Father   . Miscarriages / [place] Brother     Past medical history, social, surgical and family history all reviewed in electronic medical record.   Review of Systems: No headache, visual changes, nausea, vomiting, diarrhea, constipation, dizziness, abdominal pain, skin rash, fevers, chills, night sweats, weight loss, swollen lymph nodes, body aches, joint swelling, muscle aches, chest pain, shortness of breath, mood changes.   Objective Blood pressure 116/80, pulse 81, height 5' 3.58" (1.615 m), weight 117 lb (53.071 kg), SpO2 97 %.  General: No apparent distress alert and oriented x3 mood and affect normal, dressed appropriately.  HEENT: Pupils equal, extraocular movements intact  Respiratory: Patient's speak in full sentences and does not appear  short of breath  Cardiovascular: No lower extremity edema, non tender, no erythema  Skin: Warm dry intact with no signs of infection or rash on extremities or on axial skeleton.  Abdomen: Soft nontender  Neuro: Cranial nerves II through XII are intact, neurovascularly intact in all extremities with 2+ DTRs and 2+ pulses.  Lymph: No lymphadenopathy of posterior or anterior cervical chain or axillae bilaterally.  Gait normal with good balance and coordination.  MSK:  Non tender with full range of motion and good stability and symmetric strength and tone of shoulders, elbows, wrist, hip, knee and ankles bilaterally.    Neck: Inspection unremarkable. Mild tenderness to palpation over the left side paraspinal musculature of the cervical spine No palpable stepoffs. Negative Spurling's maneuver. Improved range of motion but continues to lack the left rotation and side bending compared to right side. Grip strength and sensation normal in bilateral hands Strength good C4 to T1 distribution No sensory change to C4 to T1 Negative Hoffman sign bilaterally Reflexes normal Continued plica rotation and pain SEEMS TO BE STABLE.    osteopathic findings C2 flexed rotated and side bent right T1 extended rotated and side bent left with elevated first rib T3 extended rotated and side bent right T7 extended rotated inside the left  Impression and Recommendations:     This case required medical decision making of moderate complexity.

## 2015-09-06 NOTE — Progress Notes (Signed)
Pre visit review using our clinic review tool, if applicable. No additional management support is needed unless otherwise documented below in the visit note. 

## 2015-09-06 NOTE — Patient Instructions (Signed)
Good to see you  Ice 20 minutes 2 times daily. Usually after activity and before bed. Keep working on the posture.  I wish you the best with your dad.  Keep doing yoga and finding time for yourself See me again in 7-8 weeks.

## 2015-09-06 NOTE — Assessment & Plan Note (Signed)
Likely some underlying arthritis. We discussed also posture and ergonomics. Patient responds well to osteopathic manipulation. Patient has had increasing stress that I think is contribute in. Follow-up again in 7-8 weeks for further evaluation. Decline formal physical therapy or further workup.

## 2015-09-06 NOTE — Assessment & Plan Note (Signed)
Decision today to treat with OMT was based on Physical Exam  After verbal consent patient was treated with HVLA, ME, FPr techniques in cervical, thoracic and rib areas  Patient tolerated the procedure well with improvement in symptoms  Patient given exercises, stretches and lifestyle modifications  See medications in patient instructions if given  Patient will follow up in 7-8 weeks

## 2015-10-26 ENCOUNTER — Ambulatory Visit (INDEPENDENT_AMBULATORY_CARE_PROVIDER_SITE_OTHER): Payer: Medicare Other | Admitting: Family Medicine

## 2015-10-26 ENCOUNTER — Encounter: Payer: Self-pay | Admitting: Family Medicine

## 2015-10-26 VITALS — BP 128/80 | HR 61 | Ht 63.8 in | Wt 118.0 lb

## 2015-10-26 DIAGNOSIS — M9908 Segmental and somatic dysfunction of rib cage: Secondary | ICD-10-CM

## 2015-10-26 DIAGNOSIS — M542 Cervicalgia: Secondary | ICD-10-CM | POA: Diagnosis not present

## 2015-10-26 DIAGNOSIS — M999 Biomechanical lesion, unspecified: Secondary | ICD-10-CM

## 2015-10-26 DIAGNOSIS — M9901 Segmental and somatic dysfunction of cervical region: Secondary | ICD-10-CM | POA: Diagnosis not present

## 2015-10-26 DIAGNOSIS — M9902 Segmental and somatic dysfunction of thoracic region: Secondary | ICD-10-CM | POA: Diagnosis not present

## 2015-10-26 NOTE — Progress Notes (Signed)
Pre visit review using our clinic review tool, if applicable. No additional management support is needed unless otherwise documented below in the visit note. 

## 2015-10-26 NOTE — Assessment & Plan Note (Signed)
Decision today to treat with OMT was based on Physical Exam  After verbal consent patient was treated with HVLA, ME, FPr techniques in cervical, thoracic and rib areas  Patient tolerated the procedure well with improvement in symptoms  Patient given exercises, stretches and lifestyle modifications  See medications in patient instructions if given  Patient will follow up in 6-12 weeks

## 2015-10-26 NOTE — Assessment & Plan Note (Signed)
Patient overall has been doing relatively well. I do believe that some of the underlying problem is also anxiety. Patient will continue to work on posture as well as ergonomics are up-to-date. We discussed lifting mechanics as well. Respond very well to osteopathic manipulation. Patient will continue with the same medications. Follow-up again in 6-12 weeks.

## 2015-10-26 NOTE — Progress Notes (Signed)
  [name], [name] Subjective:     CC: F/u neck pain    BJY:NWGNFAOZHYHPI:Subjective Tina [name] is a 70 y.o. female coming in with complaint of neck pain   Patient was having neck pain previously. It appeared to be more secondary to scapular dysfunction.underlying arthritis as well. Did respond well to osteopathic manipulation.  Patient was having increasing stress. Patient states that the personal life has gotten somewhat better which has allowed her to have a little more time to her self. Since she has been doing exercises more regularly she has noticed improvement. Denies any radiation down the arms. Denies any significant pain at night. Overall feels that she is making progress.     Past Medical History  Diagnosis Date  . High triglycerides   . Hx of varicella    Past Surgical History  Procedure Laterality Date  . Cholecystectomy     Social History  Substance Use Topics  . Smoking status: Former Games developermoker  . Smokeless tobacco: None  . Alcohol Use: 0.5 oz/week    1 drink(s) per week   No Known Allergies Family History  Problem Relation Age of Onset  . Hypertension Mother   . Hyperlipidemia Mother   . Diabetes Father   . Miscarriages / [place] Brother     Past medical history, social, surgical and family history all reviewed in electronic medical record.   Review of Systems: No headache, visual changes, nausea, vomiting, diarrhea, constipation, dizziness, abdominal pain, skin rash, fevers, chills, night sweats, weight loss, swollen lymph nodes, body aches, joint swelling, muscle aches, chest pain, shortness of breath, mood changes.   Objective Blood pressure 128/80, pulse 61, height 5' 3.8" (1.621 m), weight 118 lb (53.524 kg), SpO2 99 %.  General: No apparent distress alert and oriented x3 mood and affect normal, dressed appropriately.  HEENT: Pupils equal, extraocular movements intact    Respiratory: Patient's speak in full sentences and does not appear short of breath  Cardiovascular: No lower extremity edema, non tender, no erythema  Skin: Warm dry intact with no signs of infection or rash on extremities or on axial skeleton.  Abdomen: Soft nontender  Neuro: Cranial nerves II through XII are intact, neurovascularly intact in all extremities with 2+ DTRs and 2+ pulses.  Lymph: No lymphadenopathy of posterior or anterior cervical chain or axillae bilaterally.  Gait normal with good balance and coordination.  MSK:  Non tender with full range of motion and good stability and symmetric strength and tone of shoulders, elbows, wrist, hip, knee and ankles bilaterally.    Neck: Inspection unremarkable.  Mild tenderness to palpation over the left side paraspinal musculature of the cervical spine No palpable stepoffs. Negative Spurling's maneuver. Near full range of motion Grip strength and sensation normal in bilateral hands Strength good C4 to T1 distribution No sensory change to C4 to T1 Negative Hoffman sign bilaterally Reflexes normal     osteopathic findings C2 flexed rotated and side bent right T1 extended rotated and side bent left with elevated first rib T3 extended rotated and side bent right T8 extended rotated inside the left  Impression and Recommendations:     This case required medical decision making of moderate complexity.

## 2015-10-26 NOTE — Patient Instructions (Signed)
Good to see you  Ice is your friend if you need it You are doing everything right Enjoy the puppy  See me again in 2-3 months!

## 2015-11-23 ENCOUNTER — Other Ambulatory Visit (INDEPENDENT_AMBULATORY_CARE_PROVIDER_SITE_OTHER): Payer: Medicare Other

## 2015-11-23 DIAGNOSIS — I10 Essential (primary) hypertension: Secondary | ICD-10-CM

## 2015-11-23 DIAGNOSIS — E119 Type 2 diabetes mellitus without complications: Secondary | ICD-10-CM | POA: Diagnosis not present

## 2015-11-23 LAB — LIPID PANEL
CHOLESTEROL: 207 mg/dL — AB (ref 0–200)
HDL: 75.8 mg/dL (ref >39.00)
LDL CALC: 116 mg/dL — AB (ref 0–99)
NonHDL: 131.57
TRIGLYCERIDES: 78 mg/dL (ref 0.0–149.0)
Total CHOL/HDL Ratio: 3
VLDL: 15.6 mg/dL (ref 0.0–40.0)

## 2015-11-23 LAB — BASIC METABOLIC PANEL
BUN: 15 mg/dL (ref 6–23)
CALCIUM: 9.4 mg/dL (ref 8.4–10.5)
CO2: 27 meq/L (ref 19–32)
CREATININE: 0.79 mg/dL (ref 0.40–1.20)
Chloride: 105 mEq/L (ref 96–112)
GFR: 76.72 mL/min (ref >60.00)
GLUCOSE: 94 mg/dL (ref 70–99)
Potassium: 3.7 mEq/L (ref 3.5–5.1)
SODIUM: 140 meq/L (ref 135–145)

## 2015-11-23 LAB — HEMOGLOBIN A1C: HEMOGLOBIN A1C: 5.6 % (ref 4.6–6.5)

## 2015-11-29 NOTE — Progress Notes (Signed)
Pre visit review using our clinic review tool, if applicable. No additional management support is needed unless otherwise documented below in the visit note.  Chief Complaint  Patient presents with  . Follow-up    HPI: Tina [name] 70 y.o.  Here for fu lipids sugar off meds    feeling fine   Didn't eat healthy on vacation.  bp has been good at sm  And exercising ok with walking dog. Usually 120 /80 of below but feels up at this office   ROS: See pertinent positives and negatives per HPI.  Past Medical History:  Diagnosis Date  . High triglycerides   . Hx of varicella     Family History  Problem Relation Age of Onset  . Hypertension Mother   . Hyperlipidemia Mother   . Diabetes Father   . Miscarriages / [place] Brother     Social History   Social History  . Marital status: Divorced    Spouse name: N/A  . Number of children: N/A  . 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 Smoking status: Former Games developermoker  . Smokeless tobacco: Never Used  . Alcohol use 0.5 oz/week    1 Standard drinks or equivalent per week  . Drug use: No  . Sexual activity: Not Asked   Other Topics Concern  . None   Social History Narrative    Now [facility] no pets    .    Mother  Moved down to friends home        Divorced one chid and grandchild   Neg ets firearms.    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    Exercises regularly      Neg tad     Outpatient Medications Prior to Visit  Medication Sig Dispense Refill  . cetirizine (ZYRTEC) 10 MG tablet Take 10 mg by mouth daily.    . cycloSPORINE (RESTASIS) 0.05 % ophthalmic emulsion 1 drop 2 (two) times daily. Hebert Sohoharles Burnes, MD gives this to pt.    . MULTIPLE VITAMIN PO Take by mouth.      . Omega-3 Fatty Acids (OMEGA 3 PO) Take by mouth.      . TURMERIC PO Take by mouth.     No facility-administered  medications prior to visit.      EXAM:  BP 140/80 (BP Location: Right Arm, Patient Position: Sitting, Cuff Size: Normal)   Temp 97.7 F (36.5 C) (Oral)   Ht 5' 3.8" (1.621 m)   Wt 117 lb 12.8 oz (53.4 kg)   BMI 20.35 kg/m   Body mass index is 20.35 kg/m.  GENERAL: vitals reviewed and listed above, alert, oriented, appears well hydrated and in no acute distress HEENT: atraumatic, conjunctiva  clear, no obvious abnormalities on inspection of external nose and ears PSYCH: pleasant and cooperative, no obvious depression or anxiety Lab Results  Component Value Date   WBC 4.8 07/12/2015   HGB 13.5 07/12/2015   HCT 40.1 07/12/2015   PLT 279.0 07/12/2015   GLUCOSE 94 11/23/2015   CHOL 207 (H) 11/23/2015   TRIG 78.0 11/23/2015   HDL 75.80 11/23/2015   LDLDIRECT 137.3 07/02/2012   LDLCALC 116 (H) 11/23/2015   ALT 23 07/12/2015   AST 27 07/12/2015   NA 140 11/23/2015   K  3.7 11/23/2015   CL 105 11/23/2015   CREATININE 0.79 11/23/2015   BUN 15 11/23/2015   CO2 27 11/23/2015   TSH 3.52 07/12/2015   HGBA1C 5.6 11/23/2015   MICROALBUR 0.7 07/12/2015   Lab reviewed  ASSESSMENT AND PLAN:  Discussed the following assessment and plan:  Hyperlipidemia, mixed - acceptable    readings  will follo w  Impaired fasting blood sugar - better off niaspan  White coat syndrome without hypertension - nl at other clinincs and coming down here  keep a check at home to ensure ok   -Patient advised to return or notify health care team  if symptoms worsen ,persist or new concerns arise.  Patient Instructions   Repeat readings 140/80  Check at home to ensure at goal   BP Readings from Last 3 Encounters:  11/30/15 (!) 160/90  10/26/15 128/80  09/06/15 116/80   Ok to stay off of  Medication  , Continue lifestyle intervention healthy eating and exercise .   Get  appt  Check up  For yearly  MArch.  2018   Also  Get HEP C aby screen at that time also ( tell lab)    [name]. Tina [name] M.D.

## 2015-11-30 ENCOUNTER — Ambulatory Visit (INDEPENDENT_AMBULATORY_CARE_PROVIDER_SITE_OTHER): Payer: Medicare Other | Admitting: Internal Medicine

## 2015-11-30 ENCOUNTER — Encounter: Payer: Self-pay | Admitting: Internal Medicine

## 2015-11-30 VITALS — BP 140/80 | Temp 97.7°F | Ht 63.8 in | Wt 117.8 lb

## 2015-11-30 DIAGNOSIS — E782 Mixed hyperlipidemia: Secondary | ICD-10-CM | POA: Diagnosis not present

## 2015-11-30 DIAGNOSIS — R030 Elevated blood-pressure reading, without diagnosis of hypertension: Secondary | ICD-10-CM | POA: Diagnosis not present

## 2015-11-30 DIAGNOSIS — R7301 Impaired fasting glucose: Secondary | ICD-10-CM

## 2015-11-30 NOTE — Patient Instructions (Addendum)
Repeat readings 140/80  Check at home to ensure at goal   BP Readings from Last 3 Encounters:  11/30/15 (!) 160/90  10/26/15 128/80  09/06/15 116/80   Ok to stay off of  Medication  , Continue lifestyle intervention healthy eating and exercise .   Get  appt  Check up  For yearly  MArch.  2018   Also  Get HEP C aby screen at that time also ( tell lab)

## 2016-01-16 NOTE — Assessment & Plan Note (Signed)
Decision today to treat with OMT was based on Physical Exam  After verbal consent patient was treated with HVLA, ME, FPr techniques in cervical, thoracic and rib areas  Patient tolerated the procedure well with improvement in symptoms  Patient given exercises, stretches and lifestyle modifications  See medications in patient instructions if given  Patient will follow up in 6-12 weeks        

## 2016-01-16 NOTE — Assessment & Plan Note (Signed)
Patient overall continues to have some neck pain. Seems to be more of a dull aching sensation that is has to association with patient's anxiety as well as. Patient states

## 2016-01-16 NOTE — Progress Notes (Signed)
  Zach Cedric Denison D.O. [facility] [address] [place], [place] [address] Phone: [phone]) [phone] Subjective:     CC: F/u neck pain    BJY:[id number]:Subjective  Tina [name] is a 70 y.o. female coming in with complaint of neck pain   Patient was having neck pain previously. It appeared to be more secondary to scapular dysfunction.underlying arthritis as well. Did respond well to osteopathic manipulation.   Patient states that she has been doing very well. Did have one flare where she was having increasing neck pain for about 3 days. Patient states that the manipulation has helped some. Not having any radicular symptoms going down the arms or legs. Patient denies any weakness. States that the things that seemed to aggravate it more than anything else would be carrying her grandchild or riding in a car for long amount of time.     Past Medical History:  Diagnosis Date  . High triglycerides   . Hx of varicella    Past Surgical History:  Procedure Laterality Date  . CHOLECYSTECTOMY     Social History  Substance Use Topics  . Smoking status: Former Games developermoker  . Smokeless tobacco: Never Used  . Alcohol use 0.5 oz/week    1 Standard drinks or equivalent per week   No Known Allergies Family History  Problem Relation Age of Onset  . Hypertension Mother   . Hyperlipidemia Mother   . Diabetes Father   . Miscarriages / [place] Brother     Past medical history, social, surgical and family history all reviewed in electronic medical record.   Review of Systems: No headache, visual changes, nausea, vomiting, diarrhea, constipation, dizziness, abdominal pain, skin rash, fevers, chills, night sweats, weight loss, swollen lymph nodes, body aches, joint swelling, muscle aches, chest pain, shortness of breath, mood changes.   Objective  Blood pressure 136/84, pulse 67, weight 119 lb (54 kg), SpO2 98 %.  General: No apparent distress alert and oriented x3 mood and affect normal, dressed  appropriately.  HEENT: Pupils equal, extraocular movements intact  Respiratory: Patient's speak in full sentences and does not appear short of breath  Cardiovascular: No lower extremity edema, non tender, no erythema  Skin: Warm dry intact with no signs of infection or rash on extremities or on axial skeleton.  Abdomen: Soft nontender  Neuro: Cranial nerves II through XII are intact, neurovascularly intact in all extremities with 2+ DTRs and 2+ pulses.  Lymph: No lymphadenopathy of posterior or anterior cervical chain or axillae bilaterally.  Gait normal with good balance and coordination.  MSK:  Non tender with full range of motion and good stability and symmetric strength and tone of shoulders, elbows, wrist, hip, knee and ankles bilaterally.    Neck: Inspection unremarkable. Minimal discomfort still remaining mostly on the left side of the neck around C4. No palpable stepoffs. Negative Spurling's maneuver. Mild limitation with range of motion lacking the last 5 of left-sided side bending and rotation. Grip strength and sensation normal in bilateral hands Strength good C4 to T1 distribution No sensory change to C4 to T1 Negative Hoffman sign bilaterally Reflexes normal     osteopathic findings C2 flexed rotated and side bent right C4 flexed rotated and side bent left T1 extended rotated and side bent left with elevated first rib T3 extended rotated and side bent right T8 extended rotated inside the left  Impression and Recommendations:     This case required medical decision making of moderate complexity.

## 2016-01-17 ENCOUNTER — Ambulatory Visit (INDEPENDENT_AMBULATORY_CARE_PROVIDER_SITE_OTHER): Payer: Medicare Other | Admitting: Family Medicine

## 2016-01-17 ENCOUNTER — Encounter: Payer: Self-pay | Admitting: Family Medicine

## 2016-01-17 VITALS — BP 136/84 | HR 67 | Wt 119.0 lb

## 2016-01-17 DIAGNOSIS — M9901 Segmental and somatic dysfunction of cervical region: Secondary | ICD-10-CM

## 2016-01-17 DIAGNOSIS — M542 Cervicalgia: Secondary | ICD-10-CM | POA: Diagnosis not present

## 2016-01-17 DIAGNOSIS — M9908 Segmental and somatic dysfunction of rib cage: Secondary | ICD-10-CM

## 2016-01-17 DIAGNOSIS — M999 Biomechanical lesion, unspecified: Secondary | ICD-10-CM

## 2016-01-17 DIAGNOSIS — M9902 Segmental and somatic dysfunction of thoracic region: Secondary | ICD-10-CM

## 2016-01-17 NOTE — Patient Instructions (Signed)
Overall I am impressed.  Stay active.  Watch the posture and ergonomics through the day.  Tennis ball between shoulder blades with a lot of sitting.  When you feel tightness 2 tennis balls in the tube sock.  See me again in 2 months.

## 2016-03-17 NOTE — Progress Notes (Signed)
  Tawana ScaleZach Keltin Baird D.O. [facility] [address] [place], [place] [address] Phone: [phone]) [phone] Subjective:     CC: F/u neck pain    BJY:NWGNFAOZHYHPI:Subjective  Tina [name] is a 70 y.o. female coming in with complaint of neck pain   Patient was having neck pain previously. It appeared to be more secondary to scapular dysfunction.underlying arthritis as well. Did respond well to osteopathic manipulation.    Patient was last seen 2 months ago. Patient is encouraged to continue home exercises and conservative therapy. Patient states She is been doing very well until the last 2 weeks. Started having increasing tightness in the neck again. Since then though patient seems to be having days where she seems to do well. Lost the exercises but did get an copy recently. Has not Inc. Patient though has been doing gym classes.      Past Medical History:  Diagnosis Date  . High triglycerides   . Hx of varicella    Past Surgical History:  Procedure Laterality Date  . CHOLECYSTECTOMY     Social History  Substance Use Topics  . Smoking status: Former Games developermoker  . Smokeless tobacco: Never Used  . Alcohol use 0.5 oz/week    1 Standard drinks or equivalent per week   No Known Allergies Family History  Problem Relation Age of Onset  . Hypertension Mother   . Hyperlipidemia Mother   . Diabetes Father   . Miscarriages / [place]birth[place] Brother     Past medical history, social, surgical and family history all reviewed in electronic medical record.   Review of Systems: No headache, visual changes, nausea, vomiting, diarrhea, constipation, dizziness, abdominal pain, skin rash, fevers, chills, night sweats, weight loss, swollen lymph nodes, chest pain, shortness of breath, mood changes.   Objective  Blood pressure 114/72, pulse 68, height 5\' 4"  (1.626 m), weight 121 lb (54.9 kg), SpO2 99 %.  Systems examined below as of 03/20/16 General: NAD A&O x3 mood, affect normal  HEENT: Pupils equal, extraocular  movements intact no nystagmus Respiratory: not short of breath at rest or with speaking Cardiovascular: No lower extremity edema, non tender Skin: Warm dry intact with no signs of infection or rash on extremities or on axial skeleton. Abdomen: Soft nontender, no masses Neuro: Cranial nerves  intact, neurovascularly intact in all extremities with 2+ DTRs and 2+ pulses. Lymph: No lymphadenopathy appreciated today  Gait normal with good balance and coordination.  MSK: Non tender with full range of motion and good stability and symmetric strength and tone of shoulders, elbows, wrist,  knee hips and ankles bilaterally.     Neck: Inspection unremarkable. Continued discomfort over the per spinal musculature of the cervical spine mostly on the left side No palpable stepoffs. Negative Spurling's maneuver. Mild limitation with range of motion lacking the last 5 of left-sided side bending and rotation. Grip strength and sensation normal in bilateral hands Strength good C4 to T1 distribution No sensory change to C4 to T1 Negative Hoffman sign bilaterally Reflexes normal     osteopathic findings C2 flexed rotated and side bent right C6 flexed rotated and side bent left T1 extended rotated and side bent left with elevated first rib T5 extended rotated and side bent right T7 extended rotated inside the left  Impression and Recommendations:     This case required medical decision making of moderate complexity.

## 2016-03-20 ENCOUNTER — Ambulatory Visit (INDEPENDENT_AMBULATORY_CARE_PROVIDER_SITE_OTHER): Payer: Medicare Other | Admitting: Family Medicine

## 2016-03-20 ENCOUNTER — Ambulatory Visit: Payer: Medicare Other | Admitting: Family Medicine

## 2016-03-20 ENCOUNTER — Encounter: Payer: Self-pay | Admitting: Family Medicine

## 2016-03-20 VITALS — BP 114/72 | HR 68 | Ht 64.0 in | Wt 121.0 lb

## 2016-03-20 DIAGNOSIS — M542 Cervicalgia: Secondary | ICD-10-CM | POA: Diagnosis not present

## 2016-03-20 DIAGNOSIS — M999 Biomechanical lesion, unspecified: Secondary | ICD-10-CM

## 2016-03-20 DIAGNOSIS — M9981 Other biomechanical lesions of cervical region: Secondary | ICD-10-CM

## 2016-03-20 NOTE — Patient Instructions (Addendum)
Good to see you.  Happy holidays!  Stay active and continue to work on the exercises.  As long as you do focus on keeping upper back strong I think you should do well See me again in 6 weeks!

## 2016-03-20 NOTE — Assessment & Plan Note (Signed)
Patient seems to be stable. Continues to be active. Patient feels better when she is active. Encourage her to continue to work on Science writerergonomics and posture. We discussed icing regimen, discussed home exercises, discussed objective recently do a which was to avoid. Follow-up again in 6 weeks for further evaluation with patient having exacerbation of 8 weeks.

## 2016-03-20 NOTE — Assessment & Plan Note (Signed)
Decision today to treat with OMT was based on Physical Exam  After verbal consent patient was treated with HVLA, ME, FPr techniques in cervical, thoracic and rib areas  Patient tolerated the procedure well with improvement in symptoms  Patient given exercises, stretches and lifestyle modifications  See medications in patient instructions if given  Patient will follow up in 6 weeks

## 2016-04-30 NOTE — Progress Notes (Signed)
  Tawana ScaleZach Smith D.O. [facility] [address] [place], [place] [address] Phone: [phone]) [phone] Subjective:     CC: F/u neck pain    BJY:NWGNFAOZHYHPI:Subjective  Tina [name] is a 71 y.o. female coming in with complaint of neck pain   Patient was having neck pain previously. It appeared to be more secondary to scapular dysfunction.underlying arthritis as well. Did respond well to osteopathic manipulation.    Patient was last seen 2 months ago. Patient otherwise is doing relatively well at this time. Patient was doing great and unfortunate stopped doing the exercises on a regular basis. Stopped doing exercises regularly. Aches that this probably contributed to the pain. Denies any radiation down the arms. States though that it seems to be more painful than usual.     Past Medical History:  Diagnosis Date  . High triglycerides   . Hx of varicella    Past Surgical History:  Procedure Laterality Date  . CHOLECYSTECTOMY     Social History  Substance Use Topics  . Smoking status: Former Games developermoker  . Smokeless tobacco: Never Used  . Alcohol use 0.5 oz/week    1 Standard drinks or equivalent per week   No Known Allergies Family History  Problem Relation Age of Onset  . Hypertension Mother   . Hyperlipidemia Mother   . Diabetes Father   . Miscarriages / [place] Brother     Past medical history, social, surgical and family history all reviewed in electronic medical record.   Review of Systems: No headache, visual changes, nausea, vomiting, diarrhea, constipation, dizziness, abdominal pain, skin rash, fevers, chills, night sweats, weight loss, swollen lymph nodes, body aches, joint swelling, muscle aches, chest pain, shortness of breath, mood changes.    Objective  There were no vitals taken for this visit.  Systems examined below as of 05/01/16 General: NAD A&O x3 mood, affect normal  HEENT: Pupils equal, extraocular movements intact no nystagmus Respiratory: not short of breath at  rest or with speaking Cardiovascular: No lower extremity edema, non tender Skin: Warm dry intact with no signs of infection or rash on extremities or on axial skeleton. Abdomen: Soft nontender, no masses Neuro: Cranial nerves  intact, neurovascularly intact in all extremities with 2+ DTRs and 2+ pulses. Lymph: No lymphadenopathy appreciated today  Gait normal with good balance and coordination.  MSK: Non tender with full range of motion and good stability and symmetric strength and tone of shoulders, elbows, wrist,  knee hips and ankles bilaterally.    Neck: Inspection unremarkable. Patient does have tenderness to palpation in the paraspinal musculature of the cervical spine on the left side more than the right No palpable stepoffs. Negative Spurling's maneuver. Increasing limitation in range of motion lacking the last 10 of left-sided rotation and 5 of side bending. Grip strength and sensation normal in bilateral hands Strength good C4 to T1 distribution No sensory change to C4 to T1 Negative Hoffman sign bilaterally Reflexes normal      Osteopathic findings Cervical C2 flexed rotated and side bent right C4 flexed rotated and side bent left C6 flexed rotated and side bent left T3 extended rotated and side bent right inhaled third rib T9 extended rotated and side bent left L2 flexed rotated and side bent right Sacrum right on right   Impression and Recommendations:     This case required medical decision making of moderate complexity.

## 2016-05-01 ENCOUNTER — Ambulatory Visit (INDEPENDENT_AMBULATORY_CARE_PROVIDER_SITE_OTHER): Payer: Medicare Other | Admitting: Family Medicine

## 2016-05-01 ENCOUNTER — Encounter: Payer: Self-pay | Admitting: Family Medicine

## 2016-05-01 VITALS — BP 114/82 | HR 62 | Ht 64.0 in | Wt 120.0 lb

## 2016-05-01 DIAGNOSIS — M999 Biomechanical lesion, unspecified: Secondary | ICD-10-CM

## 2016-05-01 DIAGNOSIS — M9988 Other biomechanical lesions of rib cage: Secondary | ICD-10-CM | POA: Diagnosis not present

## 2016-05-01 DIAGNOSIS — M542 Cervicalgia: Secondary | ICD-10-CM | POA: Diagnosis not present

## 2016-05-01 NOTE — Patient Instructions (Signed)
Good to see you  Tina [name] is your friend.  On wall with heels, butt shoulder and head touching for a goal of 5 minutes daily  Stay active Get back on the schedule.  See me again in 3-4 weeks if worsening.  Happy New Year!

## 2016-05-01 NOTE — Assessment & Plan Note (Signed)
Decision today to treat with OMT was based on Physical Exam  After verbal consent patient was treated with HVLA, ME, FPr techniques in cervical, thoracic and rib areas  Patient tolerated the procedure well with improvement in symptoms  Patient given exercises, stretches and lifestyle modifications  See medications in patient instructions if given  Patient will follow up in 4 weeks

## 2016-05-01 NOTE — Assessment & Plan Note (Signed)
Patient is likely mostly secondary to some of the underlying arthritis as well as muscle imbalances and poor posture. We discussed ergonomics. We discussed icing regimen and home exercises. We discussed which activities to do in which ones to potentially avoid patient will start the exercises again. Follow-up again in 4 weeks.

## 2016-05-03 LAB — HM MAMMOGRAPHY

## 2016-05-07 ENCOUNTER — Encounter: Payer: Self-pay | Admitting: Family Medicine

## 2016-05-29 ENCOUNTER — Ambulatory Visit (INDEPENDENT_AMBULATORY_CARE_PROVIDER_SITE_OTHER): Payer: Medicare Other | Admitting: Family Medicine

## 2016-05-29 ENCOUNTER — Encounter: Payer: Self-pay | Admitting: Family Medicine

## 2016-05-29 VITALS — BP 122/80 | HR 65 | Ht 64.0 in | Wt 121.0 lb

## 2016-05-29 DIAGNOSIS — M999 Biomechanical lesion, unspecified: Secondary | ICD-10-CM

## 2016-05-29 DIAGNOSIS — M542 Cervicalgia: Secondary | ICD-10-CM

## 2016-05-29 DIAGNOSIS — M9988 Other biomechanical lesions of rib cage: Secondary | ICD-10-CM | POA: Diagnosis not present

## 2016-05-29 NOTE — Progress Notes (Signed)
  Tawana ScaleZach Smith D.O. [facility] [address] [place], [place] [address] Phone: [phone]) [phone] Subjective:     CC: F/u neck pain    DGU:[id number]:Subjective  Tina PaJulie [name] is a 71 y.o. female coming in with complaint of neck pain   Doing fairly well overall. Did increase frequency from previous visit for the manipulation. States that this seems to be better. Last couple days started having increasing discomfort in the neck again. Patient denies any radiation down the arm or any numbness or tingling. Seems to be more of a tightness. Has been trying massage with minimal success.     Past Medical History:  Diagnosis Date  . High triglycerides   . Hx of varicella    Past Surgical History:  Procedure Laterality Date  . CHOLECYSTECTOMY     Social History  Substance Use Topics  . Smoking status: Former Games developermoker  . Smokeless tobacco: Never Used  . Alcohol use 0.5 oz/week    1 Standard drinks or equivalent per week   No Known Allergies Family History  Problem Relation Age of Onset  . Hypertension Mother   . Hyperlipidemia Mother   . Diabetes Father   . Miscarriages / [place] Brother     Past medical history, social, surgical and family history all reviewed in electronic medical record.     Review of Systems: No headache, visual changes, nausea, vomiting, diarrhea, constipation, dizziness, abdominal pain, skin rash, fevers, chills, night sweats, weight loss, swollen lymph nodes, body aches, joint swelling, muscle aches, chest pain, shortness of breath, mood changes.     Objective  Blood pressure 122/80, pulse 65, height 5\' 4"  (1.626 m), weight 121 lb (54.9 kg), SpO2 99 %.  Systems examined below as of 05/29/16 General: NAD A&O x3 mood, affect normal  HEENT: Pupils equal, extraocular movements intact no nystagmus Respiratory: not short of breath at rest or with speaking Cardiovascular: No lower extremity edema, non tender Skin: Warm dry intact with no signs of infection or  rash on extremities or on axial skeleton. Abdomen: Soft nontender, no masses Neuro: Cranial nerves  intact, neurovascularly intact in all extremities with 2+ DTRs and 2+ pulses. Lymph: No lymphadenopathy appreciated today  Gait normal with good balance and coordination.  MSK: Non tender with full range of motion and good stability and symmetric strength and tone of shoulders, elbows, wrist,  knee hips and ankles bilaterally.     Neck: Inspection unremarkable. No palpable stepoffs. Negative Spurling's maneuver. Mild limitation and right-sided rotation. Grip strength and sensation normal in bilateral hands Strength good C4 to T1 distribution No sensory change to C4 to T1 Negative Hoffman sign bilaterally Reflexes normal  Osteopathic findings Cervical C2 flexed rotated and side bent right C6 flexed rotated and side bent left T3 extended rotated and side bent right inhaled third rib T7 extended rotated and side bent left L2 flexed rotated and side bent right Sacrum right on right        Impression and Recommendations:     This case required medical decision making of moderate complexity.

## 2016-05-29 NOTE — Patient Instructions (Signed)
Good to see you You are doing great  Continue to work on the posture.  I would not change much up at all  See me again in 6-7 weeks.

## 2016-05-29 NOTE — Assessment & Plan Note (Addendum)
Decision today to treat with OMT was based on Physical Exam  After verbal consent patient was treated with HVLA, ME, FPR techniques in cervical, thoracic, rib, lumbar and sacral areas  Patient tolerated the procedure well with improvement in symptoms  Patient given exercises, stretches and lifestyle modifications  See medications in patient instructions if given  Patient will follow up in 6-7 weeks 

## 2016-05-29 NOTE — Assessment & Plan Note (Signed)
Continues to have mild tightness overall. I do not feel that further imaging is warranted with patient responding fairly well to conservative therapy. We discussed icing regimen. Discussed objective is to do an which was to avoid. Patient will continue to be active though. Patient follow-up with me again in 6-7 weeks for further evaluation.

## 2016-07-09 ENCOUNTER — Telehealth: Payer: Self-pay | Admitting: Internal Medicine

## 2016-07-09 NOTE — Progress Notes (Signed)
  Tawana ScaleZach Donja Tipping D.O. [facility] [address] [place], [place] [address] Phone: [phone] Subjective:     CC: F/u neck pain    BJY:NWGNFAOZHYHPI:Subjective  Tina [name] is a 71 y.o. female coming in with complaint of neck pain   Doing fairly well overall. Did increase frequency from previous visit for the manipulation. States that this seems to be better. Last couple days started having increasing discomfort in the neck again. Patient is also had some increasing stress with her father going to the emergency room today. Patient consented over the right after this visit. Patient knows that that causes some of the neck pain. No new symptoms such as radiation down the arm or any numbness. Has responded previously to manipulation. s.      Past Medical History:  Diagnosis Date  . High triglycerides   . Hx of varicella    Past Surgical History:  Procedure Laterality Date  . CHOLECYSTECTOMY     Social History  Substance Use Topics  . Smoking status: Former Games developermoker  . Smokeless tobacco: Never Used  . Alcohol use 0.5 oz/week    1 Standard drinks or equivalent per week   No Known Allergies Family History  Problem Relation Age of Onset  . Hypertension Mother   . Hyperlipidemia Mother   . Diabetes Father   . Miscarriages / [place] Brother     Past medical history, social, surgical and family history all reviewed in electronic medical record.   Review of Systems: No headache, visual changes, nausea, vomiting, diarrhea, constipation, dizziness, abdominal pain, skin rash, fevers, chills, night sweats, weight loss, swollen lymph nodes, body aches, joint swelling, muscle aches, chest pain, shortness of breath, mood changes.    Objective  Blood pressure 130/84, pulse 79, height 5\' 4"  (1.626 m), weight 121 lb (54.9 kg), SpO2 99 %.  Systems examined below as of 07/10/16 General: NAD A&O x3 mood, affect normal  HEENT: Pupils equal, extraocular movements intact no nystagmus Respiratory:  not short of breath at rest or with speaking Cardiovascular: No lower extremity edema, non tender Skin: Warm dry intact with no signs of infection or rash on extremities or on axial skeleton. Abdomen: Soft nontender, no masses Neuro: Cranial nerves  intact, neurovascularly intact in all extremities with 2+ DTRs and 2+ pulses. Lymph: No lymphadenopathy appreciated today  Gait normal with good balance and coordination.  MSK: Non tender with full range of motion and good stability and symmetric strength and tone of shoulders, elbows, wrist,  knee hips and ankles bilaterally.  Mild to moderate arthritic changes of multiple joints.     Neck: Inspection unremarkable. No palpable stepoffs. Negative Spurling's maneuver. Mild tightness than he lacks last 5 of side bending bilaterally Grip strength and sensation normal in bilateral hands Strength good C4 to T1 distribution No sensory change to C4 to T1 Negative Hoffman sign bilaterally Reflexes normal  Osteopathic findings Cervical C2 flexed rotated and side bent right C6 flexed rotated and side bent left T3 extended rotated and side bent right inhaled third rib T7 extended rotated and side bent left L2 flexed rotated and side bent right Sacrum right on right        Impression and Recommendations:     This case required medical decision making of moderate complexity.

## 2016-07-10 ENCOUNTER — Encounter: Payer: Self-pay | Admitting: Family Medicine

## 2016-07-10 ENCOUNTER — Ambulatory Visit (INDEPENDENT_AMBULATORY_CARE_PROVIDER_SITE_OTHER): Payer: Medicare Other | Admitting: Family Medicine

## 2016-07-10 VITALS — BP 130/84 | HR 79 | Ht 64.0 in | Wt 121.0 lb

## 2016-07-10 DIAGNOSIS — M9988 Other biomechanical lesions of rib cage: Secondary | ICD-10-CM

## 2016-07-10 DIAGNOSIS — M999 Biomechanical lesion, unspecified: Secondary | ICD-10-CM

## 2016-07-10 DIAGNOSIS — M542 Cervicalgia: Secondary | ICD-10-CM

## 2016-07-10 NOTE — Assessment & Plan Note (Signed)
Continues to have tightness. Multifactorial. Patient does have underlying arthritic changes, patient does have some muscle imbalances encouraged to work on core strengthening. Still responds well to manipulation. Not taking the medications regularly for this. We discussed icing and how this can be beneficial. Follow-up again in 6 weeks.

## 2016-07-10 NOTE — Assessment & Plan Note (Signed)
Decision today to treat with OMT was based on Physical Exam  After verbal consent patient was treated with HVLA, ME, FPR techniques in cervical, thoracic, rib lumbar and sacral areas  Patient tolerated the procedure well with improvement in symptoms  Patient given exercises, stretches and lifestyle modifications  See medications in patient instructions if given  Patient will follow up in 4-6 weeks 

## 2016-07-10 NOTE — Patient Instructions (Signed)
Ne exercises for the lower back.  ICe is your friend  You are doing great overall.  For the foot watch it and try the pennsaid 20 minutes before yoga and ice afterward for 10 minutes See em again in 6-7 weeks.

## 2016-07-12 ENCOUNTER — Other Ambulatory Visit: Payer: Medicare Other

## 2016-07-18 NOTE — Progress Notes (Signed)
Chief Complaint  Patient presents with  . Annual Exam    HPI: Tina [name] 71 y.o. comes in today for Preventive exam   .Since last visit. Doing ok caretaking father in 47s DM  Retired now   . Hx of elevated lipids   Local toe numbness  Felt fdrom shoe contression. No neuropathy  dexa in past  Forgot to bring in    Health Maintenance  Topic Date Due  . FOOT EXAM  12/27/1955  . TETANUS/TDAP  03/18/2016  . HEMOGLOBIN A1C  05/25/2016  . URINE MICROALBUMIN  07/11/2016  . Hepatitis C Screening  07/23/2017 (Originally 03/04/1946)  . INFLUENZA VACCINE  11/21/2016  . OPHTHALMOLOGY EXAM  11/21/2016  . MAMMOGRAM  05/03/2018  . COLONOSCOPY  09/13/2022  . DEXA SCAN  Completed  . PNA vac Low Risk Adult  Completed   Health Maintenance Review LIFESTYLE:  TAD 3-4 etoh per week  Otherwise nl  Sugar beverages: rare to ocas Sleep: 7 hours  hh of 1 pet dog      Hearing: ok   Vision:  No limitations at present . Last eye check UTD  Safety:  Has smoke detector and wears seat belts.  No firearms. No excess sun exposure. Sees dentist regularly.  Falls: n  Memory: Felt to be good  , no concern from her or her family.  Depression: No anhedonia unusual crying or depressive symptoms  Nutrition: Eats well balanced diet; adequate calcium and vitamin D. No swallowing chewing problems.  Injury: no major injuries in the last six months.  Other healthcare providers:  Reviewed today .  Social:  Retired [employer] features editor .   Preventive parameters: up-to-date  Reviewed   ADLS:   There are no problems or need for assistance  driving, feeding, obtaining food, dressing, toileting and bathing, managing money using phone. She is independent.  EXERCISE/ HABITS   3 x per week walk  Yoga   Planning to travel to [place] in fall    ROS:  GEN/ HEENT: No fever, significant weight changes sweats headaches vision problems hearing changes, CV/ PULM; No chest pain shortness of breath cough, syncope,edema  change  in exercise tolerance. GI /GU: No adominal pain, vomiting, change in bowel habits. No blood in the stool. No significant GU symptoms. SKIN/HEME: ,no acute skin rashes suspicious lesions or bleeding. No lymphadenopathy, nodules, masses.  NEURO/ PSYCH:  No neurologic signs such as weakness numbness. No depression anxiety. IMM/ Allergy: No unusual infections.  Allergy .   REST of 12 system review negative except as per HPI   Past Medical History:  Diagnosis Date  . High triglycerides   . Hx of varicella     Family History  Problem Relation Age of Onset  . Hypertension Mother   . Hyperlipidemia Mother   . Stroke Mother   . Diabetes Father   . Miscarriages / [place] Brother     Social History   Social History  . Marital status: Divorced    Spouse name: N/A  . Number of children: N/A  . 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 Smoking status: Former Research scientist (life sciences)  . Smokeless tobacco: Never Used  . Alcohol use 0.5 oz/week    1 Standard drinks or equivalent per week     Comment: ocassionally  . Drug use: No  . Sexual activity: Not Asked   Other Topics Concern  . None   Social  History Narrative    Now [employer] no pets    .    Mother  Moved down to friends home        Divorced one chid and grandchild   Neg ets firearms.    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    Retire this summer 17   Educ [place] leave.  This semester.    Exercises regularly      Neg tad     Outpatient Encounter Prescriptions as of 07/23/2016  Medication Sig  . cetirizine (ZYRTEC) 10 MG tablet Take 10 mg by mouth daily.  . cycloSPORINE (RESTASIS) 0.05 % ophthalmic emulsion 1 drop 2 (two) times daily. Valetta Close, MD gives this to pt.  . MULTIPLE VITAMIN PO Take by mouth.    . Omega-3 Fatty Acids (OMEGA 3 PO) Take by mouth.    . TURMERIC PO Take by mouth.   No facility-administered encounter medications on file as of  07/23/2016.     EXAM:  BP 130/80 (BP Location: Right Arm, Patient Position: Sitting, Cuff Size: Normal)   Pulse 75   Temp 98.6 F (37 C) (Oral)   Ht 5' 3.5" (1.613 m)   Wt 118 lb 11.2 oz (53.8 kg)   BMI 20.70 kg/m   Body mass index is 20.7 kg/m.  Physical Exam: Vital signs reviewed STM:HDQQ is a well-developed well-nourished alert cooperative   who appears stated age in no acute distress.  HEENT: normocephalic atraumatic , Eyes: PERRL EOM's full, conjunctiva clear, Nares: paten,t no deformity discharge or tenderness., Ears: no deformity EAC's clear TMs with normal landmarks. Mouth: clear OP, no lesions, edema.  Moist mucous membranes. Dentition in adequate repair. NECK: supple without masses, thyromegaly or bruits. CHEST/PULM:  Clear to auscultation and percussion breath sounds equal no wheeze , rales or rhonchi. No chest wall deformities or tenderness.Breast: normal by inspection . No dimpling, discharge, masses, tenderness or discharge . CV: PMI is nondisplaced, S1 S2 no gallops, murmurs, rubs. Peripheral pulses are full without delay.No JVD .  ABDOMEN: Bowel sounds normal nontender  No guard or rebound, no hepato splenomegal no CVA tenderness.   Extremtities:  No clubbing cyanosis or edema, no acute joint swelling or redness no focal atrophy NEURO:  Oriented x3, cranial nerves 3-12 appear to be intact, no obvious focal weakness,gait within normal limits no abnormal reflexes or asymmetrical SKIN: No acute rashes normal turgor, color, no bruising or petechiae. PSYCH: Oriented, good eye contact, no obvious depression anxiety, cognition and judgment appear normal. LN: no cervical axillary inguinal adenopathy No noted deficits in memory, attention, and speech. BP Readings from Last 3 Encounters:  07/23/16 130/80  07/10/16 130/84  05/29/16 122/80   Wt Readings from Last 3 Encounters:  07/23/16 118 lb 11.2 oz (53.8 kg)  07/10/16 121 lb (54.9 kg)  05/29/16 121 lb (54.9 kg)        ASSESSMENT AND PLAN:  Discussed the following assessment and plan:  Visit for preventive health examination - Plan: Basic metabolic panel, CBC with Differential/Platelet, Hemoglobin A1c, Hepatic function panel, Lipid panel, TSH  Hyperlipidemia, mixed - Plan: Basic metabolic panel, CBC with Differential/Platelet, Hemoglobin A1c, Hepatic function panel, Lipid panel, TSH  Impaired fasting blood sugar - Plan: Basic metabolic panel, CBC with Differential/Platelet, Hemoglobin A1c, Hepatic function panel, Lipid panel, TSH  Family history of diabetes mellitus - Plan: Basic metabolic panel, CBC with Differential/Platelet, Hemoglobin A1c, Hepatic function panel, Lipid panel, TSH  Expectant management. Contact  [place] when close to travel for  meds typhoid etc.   rx for shingrix given  If needed  Td rx  Patient Care Team: Burnis Medin, MD as PCP - General (Internal Medicine) Crista Luria, MD as Attending Physician (Dermatology) Dyke Maes, OD (Optometry)  Patient Instructions  Continue lifestyle intervention healthy eating and exercise .   Fasting lab appt  Can get td booster and shingles vaccine.  ROV cpx in 1 year or as needed .   Health Maintenance, Female Adopting a healthy lifestyle and getting preventive care can go a long way to promote health and wellness. Talk with your health care provider about what schedule of regular examinations is right for you. This is a good chance for you to check in with your provider about disease prevention and staying healthy. In between checkups, there are plenty of things you can do on your own. Experts have done a lot of research about which lifestyle changes and preventive measures are most likely to keep you healthy. Ask your health care provider for more information. Weight and diet Eat a healthy diet  Be sure to include plenty of vegetables, fruits, low-fat dairy products, and lean protein.  Do not eat a lot of foods high in solid  fats, added sugars, or salt.  Get regular exercise. This is one of the most important things you can do for your health.  Most adults should exercise for at least 150 minutes each week. The exercise should increase your heart rate and make you sweat (moderate-intensity exercise).  Most adults should also do strengthening exercises at least twice a week. This is in addition to the moderate-intensity exercise. Maintain a healthy weight  Body mass index (BMI) is a measurement that can be used to identify possible weight problems. It estimates body fat based on height and weight. Your health care provider can help determine your BMI and help you achieve or maintain a healthy weight.  For females 33 years of age and older:  A BMI below 18.5 is considered underweight.  A BMI of 18.5 to 24.9 is normal.  A BMI of 25 to 29.9 is considered overweight.  A BMI of 30 and above is considered obese. Watch levels of cholesterol and blood lipids  You should start having your blood tested for lipids and cholesterol at 70 years of age, then have this test every 5 years.  You may need to have your cholesterol levels checked more often if:  Your lipid or cholesterol levels are high.  You are older than 70 years of age.  You are at high risk for heart disease. Cancer screening Lung Cancer  Lung cancer screening is recommended for adults 76-88 years old who are at high risk for lung cancer because of a history of smoking.  A yearly low-dose CT scan of the lungs is recommended for people who:  Currently smoke.  Have quit within the past 15 years.  Have at least a 30-pack-year history of smoking. A pack year is smoking an average of one pack of cigarettes a day for 1 year.  Yearly screening should continue until it has been 15 years since you quit.  Yearly screening should stop if you develop a health problem that would prevent you from having lung cancer treatment. Breast Cancer  Practice  breast self-awareness. This means understanding how your breasts normally appear and feel.  It also means doing regular breast self-exams. Let your health care provider know about  any changes, no matter how small.  If you are in your 20s or 30s, you should have a clinical breast exam (CBE) by a health care provider every 1-3 years as part of a regular health exam.  If you are 75 or older, have a CBE every year. Also consider having a breast X-ray (mammogram) every year.  If you have a family history of breast cancer, talk to your health care provider about genetic screening.  If you are at high risk for breast cancer, talk to your health care provider about having an MRI and a mammogram every year.  Breast cancer gene (BRCA) assessment is recommended for women who have family members with BRCA-related cancers. BRCA-related cancers include:  Breast.  Ovarian.  Tubal.  Peritoneal cancers.  Results of the assessment will determine the need for genetic counseling and BRCA1 and BRCA2 testing. Cervical Cancer  Your health care provider may recommend that you be screened regularly for cancer of the pelvic organs (ovaries, uterus, and vagina). This screening involves a pelvic examination, including checking for microscopic changes to the surface of your cervix (Pap test). You may be encouraged to have this screening done every 3 years, beginning at age 92.  For women ages 34-65, health care providers may recommend pelvic exams and Pap testing every 3 years, or they may recommend the Pap and pelvic exam, combined with testing for human papilloma virus (HPV), every 5 years. Some types of HPV increase your risk of cervical cancer. Testing for HPV may also be done on women of any age with unclear Pap test results.  Other health care providers may not recommend any screening for nonpregnant women who are considered low risk for pelvic cancer and who do not have symptoms. Ask your health care provider  if a screening pelvic exam is right for you.  If you have had past treatment for cervical cancer or a condition that could lead to cancer, you need Pap tests and screening for cancer for at least 20 years after your treatment. If Pap tests have been discontinued, your risk factors (such as having a new sexual partner) need to be reassessed to determine if screening should resume. Some women have medical problems that increase the chance of getting cervical cancer. In these cases, your health care provider may recommend more frequent screening and Pap tests. Colorectal Cancer  This type of cancer can be detected and often prevented.  Routine colorectal cancer screening usually begins at 70 years of age and continues through 70 years of age.  Your health care provider may recommend screening at an earlier age if you have risk factors for colon cancer.  Your health care provider may also recommend using home test kits to check for hidden blood in the stool.  A small camera at the end of a tube can be used to examine your colon directly (sigmoidoscopy or colonoscopy). This is done to check for the earliest forms of colorectal cancer.  Routine screening usually begins at age 16.  Direct examination of the colon should be repeated every 5-10 years through 70 years of age. However, you may need to be screened more often if early forms of precancerous polyps or small growths are found. Skin Cancer  Check your skin from head to toe regularly.  Tell your health care provider about any new moles or changes in moles, especially if there is a change in a mole's shape or color.  Also tell your health care provider if you have  a mole that is larger than the size of a pencil eraser.  Always use sunscreen. Apply sunscreen liberally and repeatedly throughout the day.  Protect yourself by wearing long sleeves, pants, a wide-brimmed hat, and sunglasses whenever you are outside. Heart disease, diabetes, and  high blood pressure  High blood pressure causes heart disease and increases the risk of stroke. High blood pressure is more likely to develop in:  People who have blood pressure in the high end of the normal range (130-139/85-89 mm Hg).  People who are overweight or obese.  People who are African American.  If you are 54-16 years of age, have your blood pressure checked every 3-5 years. If you are 50 years of age or older, have your blood pressure checked every year. You should have your blood pressure measured twice-once when you are at a hospital or clinic, and once when you are not at a hospital or clinic. Record the average of the two measurements. To check your blood pressure when you are not at a hospital or clinic, you can use:  An automated blood pressure machine at a pharmacy.  A home blood pressure monitor.  If you are between 56 years and 62 years old, ask your health care provider if you should take aspirin to prevent strokes.  Have regular diabetes screenings. This involves taking a blood sample to check your fasting blood sugar level.  If you are at a normal weight and have a low risk for diabetes, have this test once every three years after 70 years of age.  If you are overweight and have a high risk for diabetes, consider being tested at a younger age or more often. Preventing infection Hepatitis B  If you have a higher risk for hepatitis B, you should be screened for this virus. You are considered at high risk for hepatitis B if:  You were born in a country where hepatitis B is common. Ask your health care provider which countries are considered high risk.  Your parents were born in a high-risk country, and you have not been immunized against hepatitis B (hepatitis B vaccine).  You have HIV or AIDS.  You use needles to inject street drugs.  You live with someone who has hepatitis B.  You have had sex with someone who has hepatitis B.  You get hemodialysis  treatment.  You take certain medicines for conditions, including cancer, organ transplantation, and autoimmune conditions. Hepatitis C  Blood testing is recommended for:  Everyone born from [birth year] through 1964.  Anyone with known risk factors for hepatitis C. Sexually transmitted infections (STIs)  You should be screened for sexually transmitted infections (STIs) including gonorrhea and chlamydia if:  You are sexually active and are younger than 70 years of age.  You are older than 70 years of age and your health care provider tells you that you are at risk for this type of infection.  Your sexual activity has changed since you were last screened and you are at an increased risk for chlamydia or gonorrhea. Ask your health care provider if you are at risk.  If you do not have HIV, but are at risk, it may be recommended that you take a prescription medicine daily to prevent HIV infection. This is called pre-exposure prophylaxis (PrEP). You are considered at risk if:  You are sexually active and do not regularly use condoms or know the HIV status of your partner(s).  You take drugs by injection.  You are sexually  active with a partner who has HIV. Talk with your health care provider about whether you are at high risk of being infected with HIV. If you choose to begin PrEP, you should first be tested for HIV. You should then be tested every 3 months for as long as you are taking PrEP. Pregnancy  If you are premenopausal and you may become pregnant, ask your health care provider about preconception counseling.  If you may become pregnant, take 400 to 800 micrograms (mcg) of folic acid every day.  If you want to prevent pregnancy, talk to your health care provider about birth control (contraception). Osteoporosis and menopause  Osteoporosis is a disease in which the bones lose minerals and strength with aging. This can result in serious bone fractures. Your risk for osteoporosis can be  identified using a bone density scan.  If you are 62 years of age or older, or if you are at risk for osteoporosis and fractures, ask your health care provider if you should be screened.  Ask your health care provider whether you should take a calcium or vitamin D supplement to lower your risk for osteoporosis.  Menopause may have certain physical symptoms and risks.  Hormone replacement therapy may reduce some of these symptoms and risks. Talk to your health care provider about whether hormone replacement therapy is right for you. Follow these instructions at home:  Schedule regular health, dental, and eye exams.  Stay current with your immunizations.  Do not use any tobacco products including cigarettes, chewing tobacco, or electronic cigarettes.  If you are pregnant, do not drink alcohol.  If you are breastfeeding, limit how much and how often you drink alcohol.  Limit alcohol intake to no more than 1 drink per day for nonpregnant women. One drink equals 12 ounces of beer, 5 ounces of wine, or 1 ounces of hard liquor.  Do not use street drugs.  Do not share needles.  Ask your health care provider for help if you need support or information about quitting drugs.  Tell your health care provider if you often feel depressed.  Tell your health care provider if you have ever been abused or do not feel safe at home. This information is not intended to replace advice given to you by your health care provider. Make sure you discuss any questions you have with your health care provider. Document Released: 10/23/2010 Document Revised: 09/15/2015 Document Reviewed: 01/11/2015 Elsevier Interactive Patient Education  2017 [place]. Nickey Kloepfer M.D.

## 2016-07-19 ENCOUNTER — Ambulatory Visit: Payer: Medicare Other

## 2016-07-20 ENCOUNTER — Encounter: Payer: Medicare Other | Admitting: Internal Medicine

## 2016-07-23 ENCOUNTER — Encounter: Payer: Self-pay | Admitting: Internal Medicine

## 2016-07-23 ENCOUNTER — Ambulatory Visit (INDEPENDENT_AMBULATORY_CARE_PROVIDER_SITE_OTHER): Payer: Medicare Other | Admitting: Internal Medicine

## 2016-07-23 VITALS — BP 130/80 | HR 75 | Temp 98.6°F | Ht 63.5 in | Wt 118.7 lb

## 2016-07-23 DIAGNOSIS — Z833 Family history of diabetes mellitus: Secondary | ICD-10-CM

## 2016-07-23 DIAGNOSIS — E782 Mixed hyperlipidemia: Secondary | ICD-10-CM

## 2016-07-23 DIAGNOSIS — Z0000 Encounter for general adult medical examination without abnormal findings: Secondary | ICD-10-CM | POA: Diagnosis not present

## 2016-07-23 DIAGNOSIS — R7301 Impaired fasting glucose: Secondary | ICD-10-CM

## 2016-07-23 NOTE — Patient Instructions (Signed)
Continue lifestyle intervention healthy eating and exercise .   Fasting lab appt  Can get td booster and shingles vaccine.  ROV cpx in 1 year or as needed .   Health Maintenance, Female Adopting a healthy lifestyle and getting preventive care can go a long way to promote health and wellness. Talk with your health care provider about what schedule of regular examinations is right for you. This is a good chance for you to check in with your provider about disease prevention and staying healthy. In between checkups, there are plenty of things you can do on your own. Experts have done a lot of research about which lifestyle changes and preventive measures are most likely to keep you healthy. Ask your health care provider for more information. Weight and diet Eat a healthy diet  Be sure to include plenty of vegetables, fruits, low-fat dairy products, and lean protein.  Do not eat a lot of foods high in solid fats, added sugars, or salt.  Get regular exercise. This is one of the most important things you can do for your health.  Most adults should exercise for at least 150 minutes each week. The exercise should increase your heart rate and make you sweat (moderate-intensity exercise).  Most adults should also do strengthening exercises at least twice a week. This is in addition to the moderate-intensity exercise. Maintain a healthy weight  Body mass index (BMI) is a measurement that can be used to identify possible weight problems. It estimates body fat based on height and weight. Your health care provider can help determine your BMI and help you achieve or maintain a healthy weight.  For females 70 years of age and older:  A BMI below 18.5 is considered underweight.  A BMI of 18.5 to 24.9 is normal.  A BMI of 25 to 29.9 is considered overweight.  A BMI of 30 and above is considered obese. Watch levels of cholesterol and blood lipids  You should start having your blood tested for  lipids and cholesterol at 70 years of age, then have this test every 5 years.  You may need to have your cholesterol levels checked more often if:  Your lipid or cholesterol levels are high.  You are older than 70 years of age.  You are at high risk for heart disease. Cancer screening Lung Cancer  Lung cancer screening is recommended for adults 70-79 years old who are at high risk for lung cancer because of a history of smoking.  A yearly low-dose CT scan of the lungs is recommended for people who:  Currently smoke.  Have quit within the past 15 years.  Have at least a 30-pack-year history of smoking. A pack year is smoking an average of one pack of cigarettes a day for 1 year.  Yearly screening should continue until it has been 15 years since you quit.  Yearly screening should stop if you develop a health problem that would prevent you from having lung cancer treatment. Breast Cancer  Practice breast self-awareness. This means understanding how your breasts normally appear and feel.  It also means doing regular breast self-exams. Let your health care provider know about any changes, no matter how small.  If you are in your 20s or 30s, you should have a clinical breast exam (CBE) by a health care provider every 1-3 years as part of a regular health exam.  If you are 14 or older, have a CBE every year. Also consider having a breast X-ray (  mammogram) every year.  If you have a family history of breast cancer, talk to your health care provider about genetic screening.  If you are at high risk for breast cancer, talk to your health care provider about having an MRI and a mammogram every year.  Breast cancer gene (BRCA) assessment is recommended for women who have family members with BRCA-related cancers. BRCA-related cancers include:  Breast.  Ovarian.  Tubal.  Peritoneal cancers.  Results of the assessment will determine the need for genetic counseling and BRCA1 and  BRCA2 testing. Cervical Cancer  Your health care provider may recommend that you be screened regularly for cancer of the pelvic organs (ovaries, uterus, and vagina). This screening involves a pelvic examination, including checking for microscopic changes to the surface of your cervix (Pap test). You may be encouraged to have this screening done every 3 years, beginning at age 70.  For women ages 76-65, health care providers may recommend pelvic exams and Pap testing every 3 years, or they may recommend the Pap and pelvic exam, combined with testing for human papilloma virus (HPV), every 5 years. Some types of HPV increase your risk of cervical cancer. Testing for HPV may also be done on women of any age with unclear Pap test results.  Other health care providers may not recommend any screening for nonpregnant women who are considered low risk for pelvic cancer and who do not have symptoms. Ask your health care provider if a screening pelvic exam is right for you.  If you have had past treatment for cervical cancer or a condition that could lead to cancer, you need Pap tests and screening for cancer for at least 20 years after your treatment. If Pap tests have been discontinued, your risk factors (such as having a new sexual partner) need to be reassessed to determine if screening should resume. Some women have medical problems that increase the chance of getting cervical cancer. In these cases, your health care provider may recommend more frequent screening and Pap tests. Colorectal Cancer  This type of cancer can be detected and often prevented.  Routine colorectal cancer screening usually begins at 70 years of age and continues through 70 years of age.  Your health care provider may recommend screening at an earlier age if you have risk factors for colon cancer.  Your health care provider may also recommend using home test kits to check for hidden blood in the stool.  A small camera at the end  of a tube can be used to examine your colon directly (sigmoidoscopy or colonoscopy). This is done to check for the earliest forms of colorectal cancer.  Routine screening usually begins at age 14.  Direct examination of the colon should be repeated every 5-10 years through 70 years of age. However, you may need to be screened more often if early forms of precancerous polyps or small growths are found. Skin Cancer  Check your skin from head to toe regularly.  Tell your health care provider about any new moles or changes in moles, especially if there is a change in a mole's shape or color.  Also tell your health care provider if you have a mole that is larger than the size of a pencil eraser.  Always use sunscreen. Apply sunscreen liberally and repeatedly throughout the day.  Protect yourself by wearing long sleeves, pants, a wide-brimmed hat, and sunglasses whenever you are outside. Heart disease, diabetes, and high blood pressure  High blood pressure causes heart disease  and increases the risk of stroke. High blood pressure is more likely to develop in:  People who have blood pressure in the high end of the normal range (130-139/85-89 mm Hg).  People who are overweight or obese.  People who are African American.  If you are 67-1 years of age, have your blood pressure checked every 3-5 years. If you are 65 years of age or older, have your blood pressure checked every year. You should have your blood pressure measured twice-once when you are at a hospital or clinic, and once when you are not at a hospital or clinic. Record the average of the two measurements. To check your blood pressure when you are not at a hospital or clinic, you can use:  An automated blood pressure machine at a pharmacy.  A home blood pressure monitor.  If you are between 61 years and 72 years old, ask your health care provider if you should take aspirin to prevent strokes.  Have regular diabetes screenings.  This involves taking a blood sample to check your fasting blood sugar level.  If you are at a normal weight and have a low risk for diabetes, have this test once every three years after 70 years of age.  If you are overweight and have a high risk for diabetes, consider being tested at a younger age or more often. Preventing infection Hepatitis B  If you have a higher risk for hepatitis B, you should be screened for this virus. You are considered at high risk for hepatitis B if:  You were born in a country where hepatitis B is common. Ask your health care provider which countries are considered high risk.  Your parents were born in a high-risk country, and you have not been immunized against hepatitis B (hepatitis B vaccine).  You have HIV or AIDS.  You use needles to inject street drugs.  You live with someone who has hepatitis B.  You have had sex with someone who has hepatitis B.  You get hemodialysis treatment.  You take certain medicines for conditions, including cancer, organ transplantation, and autoimmune conditions. Hepatitis C  Blood testing is recommended for:  Everyone born from [birth year] through 1964.  Anyone with known risk factors for hepatitis C. Sexually transmitted infections (STIs)  You should be screened for sexually transmitted infections (STIs) including gonorrhea and chlamydia if:  You are sexually active and are younger than 71 years of age.  You are older than 70 years of age and your health care provider tells you that you are at risk for this type of infection.  Your sexual activity has changed since you were last screened and you are at an increased risk for chlamydia or gonorrhea. Ask your health care provider if you are at risk.  If you do not have HIV, but are at risk, it may be recommended that you take a prescription medicine daily to prevent HIV infection. This is called pre-exposure prophylaxis (PrEP). You are considered at risk if:  You are  sexually active and do not regularly use condoms or know the HIV status of your partner(s).  You take drugs by injection.  You are sexually active with a partner who has HIV. Talk with your health care provider about whether you are at high risk of being infected with HIV. If you choose to begin PrEP, you should first be tested for HIV. You should then be tested every 3 months for as long as you are taking PrEP. Pregnancy  If you are premenopausal and you may become pregnant, ask your health care provider about preconception counseling.  If you may become pregnant, take 400 to 800 micrograms (mcg) of folic acid every day.  If you want to prevent pregnancy, talk to your health care provider about birth control (contraception). Osteoporosis and menopause  Osteoporosis is a disease in which the bones lose minerals and strength with aging. This can result in serious bone fractures. Your risk for osteoporosis can be identified using a bone density scan.  If you are 65 years of age or older, or if you are at risk for osteoporosis and fractures, ask your health care provider if you should be screened.  Ask your health care provider whether you should take a calcium or vitamin D supplement to lower your risk for osteoporosis.  Menopause may have certain physical symptoms and risks.  Hormone replacement therapy may reduce some of these symptoms and risks. Talk to your health care provider about whether hormone replacement therapy is right for you. Follow these instructions at home:  Schedule regular health, dental, and eye exams.  Stay current with your immunizations.  Do not use any tobacco products including cigarettes, chewing tobacco, or electronic cigarettes.  If you are pregnant, do not drink alcohol.  If you are breastfeeding, limit how much and how often you drink alcohol.  Limit alcohol intake to no more than 1 drink per day for nonpregnant women. One drink equals 12 ounces of  beer, 5 ounces of wine, or 1 ounces of hard liquor.  Do not use street drugs.  Do not share needles.  Ask your health care provider for help if you need support or information about quitting drugs.  Tell your health care provider if you often feel depressed.  Tell your health care provider if you have ever been abused or do not feel safe at home. This information is not intended to replace advice given to you by your health care provider. Make sure you discuss any questions you have with your health care provider. Document Released: 10/23/2010 Document Revised: 09/15/2015 Document Reviewed: 01/11/2015 Elsevier Interactive Patient Education  2017 Elsevier Inc.  

## 2016-07-26 ENCOUNTER — Ambulatory Visit (INDEPENDENT_AMBULATORY_CARE_PROVIDER_SITE_OTHER): Payer: Medicare Other

## 2016-07-26 ENCOUNTER — Other Ambulatory Visit (INDEPENDENT_AMBULATORY_CARE_PROVIDER_SITE_OTHER): Payer: Medicare Other

## 2016-07-26 VITALS — BP 144/80 | HR 63 | Ht 63.0 in | Wt 118.0 lb

## 2016-07-26 DIAGNOSIS — R7301 Impaired fasting glucose: Secondary | ICD-10-CM | POA: Diagnosis not present

## 2016-07-26 DIAGNOSIS — Z0000 Encounter for general adult medical examination without abnormal findings: Secondary | ICD-10-CM | POA: Diagnosis not present

## 2016-07-26 DIAGNOSIS — Z833 Family history of diabetes mellitus: Secondary | ICD-10-CM

## 2016-07-26 DIAGNOSIS — E782 Mixed hyperlipidemia: Secondary | ICD-10-CM

## 2016-07-26 LAB — CBC WITH DIFFERENTIAL/PLATELET
BASOS ABS: 0 10*3/uL (ref 0.0–0.1)
Basophils Relative: 0.8 % (ref 0.0–3.0)
EOS ABS: 0.1 10*3/uL (ref 0.0–0.7)
Eosinophils Relative: 3 % (ref 0.0–5.0)
HEMATOCRIT: 41.9 % (ref 36.0–46.0)
HEMOGLOBIN: 14.2 g/dL (ref 12.0–15.0)
LYMPHS PCT: 37.1 % (ref 12.0–46.0)
Lymphs Abs: 1.8 10*3/uL (ref 0.7–4.0)
MCHC: 34 g/dL (ref 30.0–36.0)
MCV: 87.5 fl (ref 78.0–100.0)
MONO ABS: 0.3 10*3/uL (ref 0.1–1.0)
Monocytes Relative: 6.6 % (ref 3.0–12.0)
Neutro Abs: 2.6 10*3/uL (ref 1.4–7.7)
Neutrophils Relative %: 52.5 % (ref 43.0–77.0)
PLATELETS: 308 10*3/uL (ref 150.0–400.0)
RBC: 4.79 Mil/uL (ref 3.87–5.11)
RDW: 13.8 % (ref 11.5–15.5)
WBC: 5 10*3/uL (ref 4.0–10.5)

## 2016-07-26 LAB — LIPID PANEL
CHOL/HDL RATIO: 3
CHOLESTEROL: 220 mg/dL — AB (ref 0–200)
HDL: 74.9 mg/dL (ref >39.00)
LDL CALC: 133 mg/dL — AB (ref 0–99)
NonHDL: 144.95
TRIGLYCERIDES: 58 mg/dL (ref 0.0–149.0)
VLDL: 11.6 mg/dL (ref 0.0–40.0)

## 2016-07-26 LAB — BASIC METABOLIC PANEL
BUN: 17 mg/dL (ref 6–23)
CO2: 28 mEq/L (ref 19–32)
CREATININE: 0.78 mg/dL (ref 0.40–1.20)
Calcium: 9.5 mg/dL (ref 8.4–10.5)
Chloride: 105 mEq/L (ref 96–112)
GFR: 77.7 mL/min (ref >60.00)
Glucose, Bld: 93 mg/dL (ref 70–99)
Potassium: 4.3 mEq/L (ref 3.5–5.1)
Sodium: 140 mEq/L (ref 135–145)

## 2016-07-26 LAB — HEPATIC FUNCTION PANEL
ALT: 18 U/L (ref 0–35)
AST: 20 U/L (ref 0–37)
Albumin: 4.4 g/dL (ref 3.5–5.2)
Alkaline Phosphatase: 54 U/L (ref 39–117)
BILIRUBIN DIRECT: 0.1 mg/dL (ref 0.0–0.3)
BILIRUBIN TOTAL: 0.6 mg/dL (ref 0.2–1.2)
Total Protein: 6.9 g/dL (ref 6.0–8.3)

## 2016-07-26 LAB — HEMOGLOBIN A1C: Hgb A1c MFr Bld: 5.8 % (ref 4.6–6.5)

## 2016-07-26 LAB — TSH: TSH: 2.78 u[IU]/mL (ref 0.35–4.50)

## 2016-07-26 NOTE — Progress Notes (Signed)
Subjective:   Tina [name] is a 71 y.o. female who presents for an Initial Medicare Annual Wellness Visit.  The Patient was informed that the wellness visit is to identify future health risk and educate and initiate measures that can reduce risk for increased disease through the lifespan.    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 Good   Preventive Screening -Counseling & Management  Colonoscopy 08/2012 due 08/2022 Mammogram 04/2016 at [facility]  Bone Density; 07/2013 -1.7 She was to bring her old records to determine the degree of change but forgot. Will bring them over for Dr. Fabian Sharp to review for evaluation of repeat dexa.  Smoking history - former smoker on in her 24's during college Second Hand Smoke status; No Smokers in the home ETOH - occas  Medication adherence or issues? No issues   RISK FACTORS Diet: states she went on a diet when her BS numbers went up; Father was Diabetic Strategy is to watch carbs Radically restricted carbs  Uses a app:  "lose it"   3 meals per day Breakfast; egg and thin piece of toast;  Lunch; soup or salad  Supper; cooks of vegetables likes lean meats; fish [place] or chicken Sweets; ration these to only on a special occasion   Regular exercise  Takes dog for walks; terrier mix Walks 30 minutes Average 8000 and 16109 With yoga; and silver sneaker  Her goal is to add more aerobic activity and brainstormed on ways to do that.  She likes swimming and walking with weights to prepare for hiking trip in the fall out of the country    Cardiac Risk Factors:  Advanced aged; >49 in women Hyperlipidemia - High Triglycerides -78 now; HDL 75;  Chol and hdl ratio is good Diabetes: Glucose 94; A1c 5.6 / was up to 5.9  Lost 20lbs and holding;  Dad will be 95 would be doing great if he did not have dm Family History - htn; hyperlipidemia;  stroke and DM  Obesity neg  Fall risk; no  Mobility of Functional changes this year? no  Mental Health:  Any emotional problems? Anxious, depressed, irritable, sad or blue? no Denies feeling depressed or hopeless; voices pleasure in daily life How many social activities have you been engaged in within the last 2 weeks? no    Hearing Screening             Right ear:       100    Left ear:       100    Comments: Hearing issues;  Tinnitus   Vision Screening Comments: Vision  Eyes checked annually Dr. Elliot Cousin  [facility]  No diabetic retinopathy  No cataracts   Activities of Daily Living - See functional screen   Cognitive testing; Ad8 score; 0 or less than 2  MMSE deferred or completed if AD8 + 2 issues  Advanced Directives completed and will bring a copy for the record   Patient Care Team: Madelin Headings, MD as PCP - General (Internal Medicine) Campbell Stall, MD as Attending Physician (Dermatology) Elliot Cousin, OD (Optometry)   Immunization History  Administered Date(s) Administered  . Hepatitis B 09/09/1995, 10/11/1995, 07/03/1996  . Influenza Split 01/22/2012  . Influenza, High Dose Seasonal PF 03/02/2015, 02/03/2016  . Influenza-Unspecified 02/03/2014  . Pneumococcal Conjugate-13 07/07/2012  . Pneumococcal Polysaccharide-23 03/27/1999, 07/21/2013  . Tdap 03/18/2006  .  Zoster 05/12/2008   Required Immunizations needed today  Screening test up to date or reviewed for plan of completion Health Maintenance Due  Topic Date Due  . FOOT EXAM  12/27/1955  . TETANUS/TDAP  03/18/2016  . HEMOGLOBIN A1C  05/25/2016  . URINE MICROALBUMIN  07/11/2016   Foot exam due to impaired BS  Regular TD; Dr. Fabian Sharp gave her a rx  Zoster 2010; educated on the shingrix Labs completed this am and reviewed   Cardiac Risk Factors include: advanced age (>43men, >13 women)     Objective:    Today's Vitals   07/26/16  1332  BP: (!) 150/80  Pulse: 63  SpO2: 96%  Weight: 118 lb (53.5 kg)  Height:  (1.6 m)   Body mass index is 20.9 kg/m.   Current Medications (verified) Outpatient Encounter Prescriptions as of 07/26/2016  Medication Sig  . cetirizine (ZYRTEC) 10 MG tablet Take 10 mg by mouth daily.  . cholecalciferol (VITAMIN D) 1000 units tablet Take 1,000 Units by mouth daily.  . cycloSPORINE (RESTASIS) 0.05 % ophthalmic emulsion 1 drop 2 (two) times daily. Hebert Soho, MD gives this to pt.  . MULTIPLE VITAMIN PO Take by mouth.    . Omega-3 Fatty Acids (OMEGA 3 PO) Take by mouth.    . TURMERIC PO Take by mouth.   No facility-administered encounter medications on file as of 07/26/2016.     Allergies (verified) Patient has no known allergies.   History: Past Medical History:  Diagnosis Date  . High triglycerides   . Hx of varicella    Past Surgical History:  Procedure Laterality Date  . CHOLECYSTECTOMY     Family History  Problem Relation Age of Onset  . Hypertension Mother   . Hyperlipidemia Mother   . Stroke Mother   . Diabetes Father   . 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 Smoking status: Former Smoker    Packs/day: 0.70    Years: 4.00  . Smokeless tobacco: Never Used     Comment: in her 38's   . Alcohol use 0.5 oz/week    1 Standard drinks or equivalent per week     Comment: ocassionally  . Drug use: No  . Sexual activity: Not on file    Tobacco Counseling Counseling given: Not Answered   Activities of Daily Living In your present state of health, do you have any difficulty performing the following activities: 07/26/2016  Hearing? N  Vision? N  Difficulty concentrating or making decisions? N  Walking or climbing stairs? N  Dressing or bathing? N  Doing errands, shopping? N  Preparing Food and eating ? N  Using the Toilet? N  In the past six months, have you  accidently leaked urine? N  Do you have problems with loss of bowel control? N  Managing your Medications? N  Managing your Finances? N  Housekeeping or managing your Housekeeping? N  Some recent data might be hidden    Immunizations and Health Maintenance Immunization History  Administered Date(s) Administered  . Hepatitis B 09/09/1995, 10/11/1995, 07/03/1996  . Influenza Split 01/22/2012  . Influenza, High Dose Seasonal PF 03/02/2015, 02/03/2016  . Influenza-Unspecified 02/03/2014  . Pneumococcal Conjugate-13 07/07/2012  . Pneumococcal Polysaccharide-23 03/27/1999, 07/21/2013  . Tdap 03/18/2006  . Zoster 05/12/2008   Health Maintenance Due  Topic Date Due  . FOOT EXAM  12/27/1955  . TETANUS/TDAP  03/18/2016  . HEMOGLOBIN A1C  05/25/2016  . URINE MICROALBUMIN  07/11/2016    Patient Care Team: Madelin Headings, MD as PCP - General (Internal Medicine) Campbell Stall, MD as Attending Physician (Dermatology) Elliot Cousin, OD (Optometry)  Indicate any recent Medical Services you may have received from other than [facility] providers in the past year (date may be approximate).     Assessment:   This is a routine wellness examination for Tina [name].   Hearing/Vision screen  Hearing Screening             Right ear:       100    Left ear:       100    Comments: Hearing issues;  Tinnitus   Vision Screening Comments: Vision  Eyes checked annually Dr. Elliot Cousin  [facility]  No diabetic retinopathy  No cataracts  Dietary issues and exercise activities discussed: Current Exercise Habits: Home exercise routine, Type of exercise: strength training/weights;walking, Time (Minutes): 60, Frequency (Times/Week): 5, Weekly Exercise (Minutes/Week): 300, Intensity: Moderate  Goals    . patient          Do more cardiovascular work       Depression Screen PHQ 2/9 Scores 07/26/2016 07/23/2016 07/19/2015 07/21/2013  PHQ - 2 Score 0 0 0 0     Fall Risk Fall Risk  07/26/2016 07/23/2016 07/19/2015 07/21/2013  Falls in the past year? No No No No    Cognitive Function: no issues Still engaging and continues her research         Screening Tests Health Maintenance  Topic Date Due  . FOOT EXAM  [birth year]27/1955  . TETANUS/TDAP  03/18/2016  . HEMOGLOBIN A1C  05/25/2016  . URINE MICROALBUMIN  07/11/2016  . Hepatitis C Screening  07/23/2017 (Originally 1945-08-19)  . INFLUENZA VACCINE  11/21/2016  . OPHTHALMOLOGY EXAM  11/21/2016  . MAMMOGRAM  05/03/2018  . COLONOSCOPY  09/13/2022  . DEXA SCAN  Completed  . PNA vac Low Risk Adult  Completed      Plan:    PCP Notes  Health Maintenance  Dr. Glenna Durand wrote her a rx for TD Will plan to take when her grand-dtr is updated on her vaccinations in June. Postponed until the end of June.   Forget to bring her Dexa scan records but will bring these over for Dr. Fabian Sharp to compare.  Will continue to get her calcium through food; does take a MVI and Vit D 1000u per day. Weight bearing exercise as well.  Abnormal Screens   Referrals   Patient concerns; To check BP a couple of times this week  BP elevated but she did have bacon this am with dtr and grand-dtr. Will monitor sodium but states her bp does go up at the doctor's office at times.  Agrees to check her BP and if it remains elevated, she will make an apt with Dr. Fabian Sharp   Nurse Concerns; none  Next PCP apt is for one year; will add to my schedule for AWV.    During the course of the visit, Georjean was educated and counseled about the following appropriate screening and preventive services:   Vaccines to include Pneumoccal, Influenza, Hepatitis B, Td, Zostavax, HCV  Electrocardiogram  Cardiovascular disease screening  Colorectal cancer screening  Bone density screening   Diabetes screening  Glaucoma screening  Mammography/  Nutrition counseling  Smoking cessation counseling  Patient Instructions (the written plan)  were given to the patient.    Jaydalynn Olivero, RN  07/26/2016      

## 2016-07-26 NOTE — Patient Instructions (Addendum)
Tina [name] , Thank you for taking time to come for your Medicare Wellness Visit. I appreciate your ongoing commitment to your health goals. Please review the following plan we discussed and let me know if I can assist you in the future.   Will bring a copy of an AD or living will to the office to scan  Will check your BP a couple of times a week and call Dr. Regis Bill if it remains elevated Minimal Blood Pressure Goal= AVERAGE < 140/90; Ideal is an AVERAGE < 135/85. This AVERAGE should be calculated from @ least 5-7 BP readings taken @ different times of day on different days of week. You should not respond to isolated BP readings , but rather the AVERAGE for that week .Please bring your blood pressure cuff to office visits to verify that it is reliable.It can also be checked against the blood pressure device at the pharmacy. Finger or wrist cuffs are not dependable; an arm cuff is.  Also, monitor sodium intake in label of any canned food, as well as sodium in common everyday condiments; ketchup; salad dressing, sauces and any fast food restaurant.      These are the goals we discussed: Goals    . patient          Do more cardiovascular work        This is a list of the screening recommended for you and due dates:  Health Maintenance  Topic Date Due  . Complete foot exam   12/27/1955  . Urine Protein Check  07/11/2016  . Tetanus Vaccine  11/20/2016*  .  Hepatitis C: One time screening is recommended by Center for Disease Control  (CDC) for  adults born from [birth year] through 1964.   07/23/2017*  . Flu Shot  11/21/2016  . Eye exam for diabetics  11/21/2016  . Hemoglobin A1C  01/25/2017  . Mammogram  05/03/2018  . Colon Cancer Screening  09/13/2022  . DEXA scan (bone density measurement)  Completed  . Pneumonia vaccines  Completed  *Topic was postponed. The date shown is not the original due date.        Fall Prevention in the Home Falls can cause injuries. They can happen to  people of all ages. There are many things you can do to make your home safe and to help prevent falls. What can I do on the outside of my home?  Regularly fix the edges of walkways and driveways and fix any cracks.  Remove anything that might make you trip as you walk through a door, such as a raised step or threshold.  Trim any bushes or trees on the path to your home.  Use bright outdoor lighting.  Clear any walking paths of anything that might make someone trip, such as rocks or tools.  Regularly check to see if handrails are loose or broken. Make sure that both sides of any steps have handrails.  Any raised decks and porches should have guardrails on the edges.  Have any leaves, snow, or ice cleared regularly.  Use sand or salt on walking paths during winter.  Clean up any spills in your garage right away. This includes oil or grease spills. What can I do in the bathroom?  Use night lights.  Install grab bars by the toilet and in the tub and shower. Do not use towel bars as grab bars.  Use non-skid mats or decals in the tub or shower.  If you need to sit down  in the shower, use a plastic, non-slip stool.  Keep the floor dry. Clean up any water that spills on the floor as soon as it happens.  Remove soap buildup in the tub or shower regularly.  Attach bath mats securely with double-sided non-slip rug tape.  Do not have throw rugs and other things on the floor that can make you trip. What can I do in the bedroom?  Use night lights.  Make sure that you have a light by your bed that is easy to reach.  Do not use any sheets or blankets that are too big for your bed. They should not hang down onto the floor.  Have a firm chair that has side arms. You can use this for support while you get dressed.  Do not have throw rugs and other things on the floor that can make you trip. What can I do in the kitchen?  Clean up any spills right away.  Avoid walking on wet  floors.  Keep items that you use a lot in easy-to-reach places.  If you need to reach something above you, use a strong step stool that has a grab bar.  Keep electrical cords out of the way.  Do not use floor polish or wax that makes floors slippery. If you must use wax, use non-skid floor wax.  Do not have throw rugs and other things on the floor that can make you trip. What can I do with my stairs?  Do not leave any items on the stairs.  Make sure that there are handrails on both sides of the stairs and use them. Fix handrails that are broken or loose. Make sure that handrails are as long as the stairways.  Check any carpeting to make sure that it is firmly attached to the stairs. Fix any carpet that is loose or worn.  Avoid having throw rugs at the top or bottom of the stairs. If you do have throw rugs, attach them to the floor with carpet tape.  Make sure that you have a light switch at the top of the stairs and the bottom of the stairs. If you do not have them, ask someone to add them for you. What else can I do to help prevent falls?  Wear shoes that:  Do not have high heels.  Have rubber bottoms.  Are comfortable and fit you well.  Are closed at the toe. Do not wear sandals.  If you use a stepladder:  Make sure that it is fully opened. Do not climb a closed stepladder.  Make sure that both sides of the stepladder are locked into place.  Ask someone to hold it for you, if possible.  Clearly mark and make sure that you can see:  Any grab bars or handrails.  First and last steps.  Where the edge of each step is.  Use tools that help you move around (mobility aids) if they are needed. These include:  Canes.  Walkers.  Scooters.  Crutches.  Turn on the lights when you go into a dark area. Replace any light bulbs as soon as they burn out.  Set up your furniture so you have a clear path. Avoid moving your furniture around.  If any of your floors are  uneven, fix them.  If there are any pets around you, be aware of where they are.  Review your medicines with your doctor. Some medicines can make you feel dizzy. This can increase your chance of falling.  Ask your doctor what other things that you can do to help prevent falls. This information is not intended to replace advice given to you by your health care provider. Make sure you discuss any questions you have with your health care provider. Document Released: 02/03/2009 Document Revised: 09/15/2015 Document Reviewed: 05/14/2014 Elsevier Interactive Patient Education  2017 [place] Proper Maintenance, Female Adopting a healthy lifestyle and getting preventive care can go a long way to promote health and wellness. Talk with your health care provider about what schedule of regular examinations is right for you. This is a good chance for you to check in with your provider about disease prevention and staying healthy. In between checkups, there are plenty of things you can do on your own. Experts have done a lot of research about which lifestyle changes and preventive measures are most likely to keep you healthy. Ask your health care provider for more information. Weight and diet Eat a healthy diet  Be sure to include plenty of vegetables, fruits, low-fat dairy products, and lean protein.  Do not eat a lot of foods high in solid fats, added sugars, or salt.  Get regular exercise. This is one of the most important things you can do for your health.  Most adults should exercise for at least 150 minutes each week. The exercise should increase your heart rate and make you sweat (moderate-intensity exercise).  Most adults should also do strengthening exercises at least twice a week. This is in addition to the moderate-intensity exercise. Maintain a healthy weight  Body mass index (BMI) is a measurement that can be used to identify possible weight problems. It estimates body fat based on  height and weight. Your health care provider can help determine your BMI and help you achieve or maintain a healthy weight.  For females 56 years of age and older:  A BMI below 18.5 is considered underweight.  A BMI of 18.5 to 24.9 is normal.  A BMI of 25 to 29.9 is considered overweight.  A BMI of 30 and above is considered obese. Watch levels of cholesterol and blood lipids  You should start having your blood tested for lipids and cholesterol at 70 years of age, then have this test every 5 years.  You may need to have your cholesterol levels checked more often if:  Your lipid or cholesterol levels are high.  You are older than 70 years of age.  You are at high risk for heart disease. Cancer screening Lung Cancer  Lung cancer screening is recommended for adults 14-33 years old who are at high risk for lung cancer because of a history of smoking.  A yearly low-dose CT scan of the lungs is recommended for people who:  Currently smoke.  Have quit within the past 15 years.  Have at least a 30-pack-year history of smoking. A pack year is smoking an average of one pack of cigarettes a day for 1 year.  Yearly screening should continue until it has been 15 years since you quit.  Yearly screening should stop if you develop a health problem that would prevent you from having lung cancer treatment. Breast Cancer  Practice breast self-awareness. This means understanding how your breasts normally appear and feel.  It also means doing regular breast self-exams. Let your health care provider know about any changes, no matter how small.  If you are in your 20s or 30s, you should have a clinical breast exam (CBE) by a health care provider  every 1-3 years as part of a regular health exam.  If you are 29 or older, have a CBE every year. Also consider having a breast X-ray (mammogram) every year.  If you have a family history of breast cancer, talk to your health care provider about  genetic screening.  If you are at high risk for breast cancer, talk to your health care provider about having an MRI and a mammogram every year.  Breast cancer gene (BRCA) assessment is recommended for women who have family members with BRCA-related cancers. BRCA-related cancers include:  Breast.  Ovarian.  Tubal.  Peritoneal cancers.  Results of the assessment will determine the need for genetic counseling and BRCA1 and BRCA2 testing. Cervical Cancer  Your health care provider may recommend that you be screened regularly for cancer of the pelvic organs (ovaries, uterus, and vagina). This screening involves a pelvic examination, including checking for microscopic changes to the surface of your cervix (Pap test). You may be encouraged to have this screening done every 3 years, beginning at age 4.  For women ages 63-65, health care providers may recommend pelvic exams and Pap testing every 3 years, or they may recommend the Pap and pelvic exam, combined with testing for human papilloma virus (HPV), every 5 years. Some types of HPV increase your risk of cervical cancer. Testing for HPV may also be done on women of any age with unclear Pap test results.  Other health care providers may not recommend any screening for nonpregnant women who are considered low risk for pelvic cancer and who do not have symptoms. Ask your health care provider if a screening pelvic exam is right for you.  If you have had past treatment for cervical cancer or a condition that could lead to cancer, you need Pap tests and screening for cancer for at least 20 years after your treatment. If Pap tests have been discontinued, your risk factors (such as having a new sexual partner) need to be reassessed to determine if screening should resume. Some women have medical problems that increase the chance of getting cervical cancer. In these cases, your health care provider may recommend more frequent screening and Pap  tests. Colorectal Cancer  This type of cancer can be detected and often prevented.  Routine colorectal cancer screening usually begins at 70 years of age and continues through 70 years of age.  Your health care provider may recommend screening at an earlier age if you have risk factors for colon cancer.  Your health care provider may also recommend using home test kits to check for hidden blood in the stool.  A small camera at the end of a tube can be used to examine your colon directly (sigmoidoscopy or colonoscopy). This is done to check for the earliest forms of colorectal cancer.  Routine screening usually begins at age 77.  Direct examination of the colon should be repeated every 5-10 years through 70 years of age. However, you may need to be screened more often if early forms of precancerous polyps or small growths are found. Skin Cancer  Check your skin from head to toe regularly.  Tell your health care provider about any new moles or changes in moles, especially if there is a change in a mole's shape or color.  Also tell your health care provider if you have a mole that is larger than the size of a pencil eraser.  Always use sunscreen. Apply sunscreen liberally and repeatedly throughout the day.  Protect yourself  by wearing long sleeves, pants, a wide-brimmed hat, and sunglasses whenever you are outside. Heart disease, diabetes, and high blood pressure  High blood pressure causes heart disease and increases the risk of stroke. High blood pressure is more likely to develop in:  People who have blood pressure in the high end of the normal range (130-139/85-89 mm Hg).  People who are overweight or obese.  People who are African American.  If you are 72-52 years of age, have your blood pressure checked every 3-5 years. If you are 77 years of age or older, have your blood pressure checked every year. You should have your blood pressure measured twice-once when you are at a  hospital or clinic, and once when you are not at a hospital or clinic. Record the average of the two measurements. To check your blood pressure when you are not at a hospital or clinic, you can use:  An automated blood pressure machine at a pharmacy.  A home blood pressure monitor.  If you are between 1 years and 41 years old, ask your health care provider if you should take aspirin to prevent strokes.  Have regular diabetes screenings. This involves taking a blood sample to check your fasting blood sugar level.  If you are at a normal weight and have a low risk for diabetes, have this test once every three years after 70 years of age.  If you are overweight and have a high risk for diabetes, consider being tested at a younger age or more often. Preventing infection Hepatitis B  If you have a higher risk for hepatitis B, you should be screened for this virus. You are considered at high risk for hepatitis B if:  You were born in a country where hepatitis B is common. Ask your health care provider which countries are considered high risk.  Your parents were born in a high-risk country, and you have not been immunized against hepatitis B (hepatitis B vaccine).  You have HIV or AIDS.  You use needles to inject street drugs.  You live with someone who has hepatitis B.  You have had sex with someone who has hepatitis B.  You get hemodialysis treatment.  You take certain medicines for conditions, including cancer, organ transplantation, and autoimmune conditions. Hepatitis C  Blood testing is recommended for:  Everyone born from [birth year] through 1964.  Anyone with known risk factors for hepatitis C. Sexually transmitted infections (STIs)  You should be screened for sexually transmitted infections (STIs) including gonorrhea and chlamydia if:  You are sexually active and are younger than 70 years of age.  You are older than 70 years of age and your health care provider tells you  that you are at risk for this type of infection.  Your sexual activity has changed since you were last screened and you are at an increased risk for chlamydia or gonorrhea. Ask your health care provider if you are at risk.  If you do not have HIV, but are at risk, it may be recommended that you take a prescription medicine daily to prevent HIV infection. This is called pre-exposure prophylaxis (PrEP). You are considered at risk if:  You are sexually active and do not regularly use condoms or know the HIV status of your partner(s).  You take drugs by injection.  You are sexually active with a partner who has HIV. Talk with your health care provider about whether you are at high risk of being infected with HIV. If you  choose to begin PrEP, you should first be tested for HIV. You should then be tested every 3 months for as long as you are taking PrEP. Pregnancy  If you are premenopausal and you may become pregnant, ask your health care provider about preconception counseling.  If you may become pregnant, take 400 to 800 micrograms (mcg) of folic acid every day.  If you want to prevent pregnancy, talk to your health care provider about birth control (contraception). Osteoporosis and menopause  Osteoporosis is a disease in which the bones lose minerals and strength with aging. This can result in serious bone fractures. Your risk for osteoporosis can be identified using a bone density scan.  If you are 46 years of age or older, or if you are at risk for osteoporosis and fractures, ask your health care provider if you should be screened.  Ask your health care provider whether you should take a calcium or vitamin D supplement to lower your risk for osteoporosis.  Menopause may have certain physical symptoms and risks.  Hormone replacement therapy may reduce some of these symptoms and risks. Talk to your health care provider about whether hormone replacement therapy is right for you. Follow  these instructions at home:  Schedule regular health, dental, and eye exams.  Stay current with your immunizations.  Do not use any tobacco products including cigarettes, chewing tobacco, or electronic cigarettes.  If you are pregnant, do not drink alcohol.  If you are breastfeeding, limit how much and how often you drink alcohol.  Limit alcohol intake to no more than 1 drink per day for nonpregnant women. One drink equals 12 ounces of beer, 5 ounces of wine, or 1 ounces of hard liquor.  Do not use street drugs.  Do not share needles.  Ask your health care provider for help if you need support or information about quitting drugs.  Tell your health care provider if you often feel depressed.  Tell your health care provider if you have ever been abused or do not feel safe at home. This information is not intended to replace advice given to you by your health care provider. Make sure you discuss any questions you have with your health care provider. Document Released: 10/23/2010 Document Revised: 09/15/2015 Document Reviewed: 01/11/2015 Elsevier Interactive Patient Education  2017 [employer].

## 2016-07-26 NOTE — Progress Notes (Signed)
I have reviewed and agree with note, evaluation, plan.   Raymund Manrique, MD  

## 2016-08-20 NOTE — Progress Notes (Signed)
  [name] [address] [place], [place] [address] Phone: [phone] Subjective:     CC: F/u neck pain    [id number]  Tina [name] is a 71 y.o. female coming in with complaint of neck pain   Continues to do well. Has had pain for the last 48 hours in the upper neck. Patient was doing a lot of gardening. Patient states that this is the same symptoms that she's had previously just worsening recently. Always responds well to manipulation.      Past Medical History:  Diagnosis Date  . High triglycerides   . Hx of varicella    Past Surgical History:  Procedure Laterality Date  . CHOLECYSTECTOMY     Social History  Substance Use Topics  . Smoking status: Former Smoker    Packs/day: 0.70    Years: 4.00  . Smokeless tobacco: Never Used     Comment: in her 73's   . Alcohol use 0.5 oz/week    1 Standard drinks or equivalent per week     Comment: ocassionally   No Known Allergies Family History  Problem Relation Age of Onset  . Hypertension Mother   . Hyperlipidemia Mother   . Stroke Mother   . Diabetes Father   . Miscarriages / [place] Brother     Past medical history, social, surgical and family history all reviewed in electronic medical record.   Review of Systems: No headache, visual changes, nausea, vomiting, diarrhea, constipation, dizziness, abdominal pain, skin rash, fevers, chills, night sweats, weight loss, swollen lymph nodes, body aches, joint swelling, muscle aches, chest pain, shortness of breath, mood changes.    Objective  Blood pressure 102/70, pulse 89, resp. rate 16, weight 121 lb 3.2 oz (55 kg), SpO2 98 %.  Systems examined below as of 08/21/16 General: NAD A&O x3 mood, affect normal  HEENT: Pupils equal, extraocular movements intact no nystagmus Respiratory: not short of breath at rest or with speaking Cardiovascular: No lower extremity edema, non tender Skin: Warm dry intact with no signs of infection or rash  on extremities or on axial skeleton. Abdomen: Soft nontender, no masses Neuro: Cranial nerves  intact, neurovascularly intact in all extremities with 2+ DTRs and 2+ pulses. Lymph: No lymphadenopathy appreciated today  Gait normal with good balance and coordination.  MSK: Non tender with full range of motion and good stability and symmetric strength and tone of shoulders, elbows, wrist,  knee hips and ankles bilaterally.    Neck: Inspection unremarkable. No palpable stepoffs. Negative Spurling's maneuver. Mild limitation in the last 5 of left-sided rotation and right-sided side bending Grip strength and sensation normal in bilateral hands Strength good C4 to T1 distribution No sensory change to C4 to T1 Negative Hoffman sign bilaterally Reflexes normal  Osteopathic findings C7 flexed rotated and side bent left T3 extended rotated and side bent left inhaled third rib T9 extended rotated and side bent left L3 flexed rotated and side bent right Sacrum right on right '        Impression and Recommendations:     This case required medical decision making of moderate complexity.

## 2016-08-21 ENCOUNTER — Encounter: Payer: Self-pay | Admitting: Family Medicine

## 2016-08-21 ENCOUNTER — Ambulatory Visit (INDEPENDENT_AMBULATORY_CARE_PROVIDER_SITE_OTHER): Payer: Medicare Other | Admitting: Family Medicine

## 2016-08-21 VITALS — BP 102/70 | HR 89 | Resp 16 | Wt 121.2 lb

## 2016-08-21 DIAGNOSIS — M999 Biomechanical lesion, unspecified: Secondary | ICD-10-CM

## 2016-08-21 DIAGNOSIS — M542 Cervicalgia: Secondary | ICD-10-CM

## 2016-08-21 NOTE — Assessment & Plan Note (Signed)
Seems to have some mild increasing tightness today. We discussed ergonomics with her doing more yard work recently. We discussed icing regimen, home exercises in which activities doing which ones to avoid. Patient will start to increase activity as tolerated. Follow-up with me again in 6-7 weeks.

## 2016-08-21 NOTE — Assessment & Plan Note (Signed)
Decision today to treat with OMT was based on Physical Exam  After verbal consent patient was treated with HVLA, ME, FPR techniques in cervical, thoracic, rib areas  Patient tolerated the procedure well with improvement in symptoms  Patient given exercises, stretches and lifestyle modifications  See medications in patient instructions if given  Patient will follow up in 6-7 weeks 

## 2016-08-21 NOTE — Patient Instructions (Signed)
Good to see you  Tina [name] is your friend.  Don't change a thing See me again in 6-7 weeks.

## 2016-09-04 NOTE — Telephone Encounter (Signed)
Called to see if pt wanted to schedule awv - left message.  

## 2016-09-29 NOTE — Progress Notes (Signed)
Tina [name] D.O.  [facility] [address] [place], [place] [address] Phone: [phone](336) [phone] Subjective:    I'm seeing this patient by the request  of:  Panosh, Neta MendsWanda K, MD   CC: Upper back pain and low back pain  OZH:YQMVHQIONGHPI:Subjective  Tina [name] is a 71 y.o. female coming in with complaint of upper back and low back pain. Patient has had more muscle imbalances and poor posture. Has responded fairly well to osteopathic manipulation in the past. Seems to be doing relatively well. Increasing stress of her father's health is also been contribute factor. States she is worsening of previous symptoms. No new symptoms.     Past Medical History:  Diagnosis Date  . High triglycerides   . Hx of varicella    Past Surgical History:  Procedure Laterality Date  . CHOLECYSTECTOMY     Social History   Social History  . Marital status: Divorced    Spouse name: N/A  . Number of children: N/A  . 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 Smoking status: Former Smoker    Packs/day: 0.70    Years: 4.00  . Smokeless tobacco: Never Used     Comment: in her 4520's   . Alcohol use 0.5 oz/week    1 Standard drinks or equivalent per week     Comment: ocassionally  . Drug use: No  . Sexual activity: Not Asked   Other Topics Concern  . None   Social History Narrative    Now hhof 1 no pets    .    Mother  Moved down to friends home        Divorced one chid and grandchild   Neg ets firearms.    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    Exercises regularly      Neg tad    No Known Allergies Family History  Problem Relation Age of Onset  . Hypertension Mother   . Hyperlipidemia Mother   . Stroke Mother   . Diabetes Father   . Miscarriages / [place]     Past medical history, social, surgical and family history all reviewed in electronic  medical record.  No pertanent information unless stated regarding to the chief complaint.   Review of Systems:Review of systems updated and as accurate as of 10/01/16  No headache, visual changes, nausea, vomiting, diarrhea, constipation, dizziness, abdominal pain, skin rash, fevers, chills, night sweats, weight loss, swollen lymph nodes, body aches, joint swelling, muscle aches, chest pain, shortness of breath, mood changes.   Objective  Blood pressure 124/78, pulse 70, height 5\' 4"  (1.626 m), weight 120 lb (54.4 kg), SpO2 99 %. Systems examined below as of 10/01/16   General: No apparent distress alert and oriented x3 mood and affect normal, dressed appropriately.  HEENT: Pupils equal, extraocular movements intact  Respiratory: Patient's speak in full sentences and does not appear short of breath  Cardiovascular: No lower extremity edema, non tender, no erythema  Skin: Warm dry intact with no signs of infection or rash on extremities or on axial skeleton.  Abdomen: Soft nontender  Neuro: Cranial nerves II through XII are intact, neurovascularly intact in all extremities with 2+ DTRs and 2+ pulses.  Lymph: No lymphadenopathy of posterior or anterior cervical chain or  axillae bilaterally.  Gait normal with good balance and coordination.  MSK:  Non tender with full range of motion and good stability and symmetric strength and tone of shoulders, elbows, wrist, hip, knee and ankles bilaterally. Mild arthritic changes of multiple joints Neck: Inspection mild loss of lordosis. No palpable stepoffs. Negative Spurling's maneuver. Mild limitation in left-sided side bending and rotation Grip strength and sensation normal in bilateral hands Strength good C4 to T1 distribution No sensory change to C4 to T1 Negative Hoffman sign bilaterally Reflexes normal  Low back exam shows the patient has mild tenderness over the sacroiliac jointbut otherwise unremarkable  Osteopathic findings C2 flexed  rotated and side bent right C4 flexed rotated and side bent left C6 flexed rotated and side bent left T4 extended rotated and side bent right inhaled rib L3 flexed rotated and side bent right Sacrum right on right    Impression and Recommendations:     This case required medical decision making of moderate complexity.      Note: This dictation was prepared with Dragon dictation along with smaller phrase technology. Any transcriptional errors that result from this process are unintentional.

## 2016-10-01 ENCOUNTER — Encounter: Payer: Self-pay | Admitting: Family Medicine

## 2016-10-01 ENCOUNTER — Ambulatory Visit (INDEPENDENT_AMBULATORY_CARE_PROVIDER_SITE_OTHER): Payer: Medicare Other | Admitting: Family Medicine

## 2016-10-01 VITALS — BP 124/78 | HR 70 | Ht 64.0 in | Wt 120.0 lb

## 2016-10-01 DIAGNOSIS — M9988 Other biomechanical lesions of rib cage: Secondary | ICD-10-CM | POA: Diagnosis not present

## 2016-10-01 DIAGNOSIS — M542 Cervicalgia: Secondary | ICD-10-CM | POA: Diagnosis not present

## 2016-10-01 DIAGNOSIS — M999 Biomechanical lesion, unspecified: Secondary | ICD-10-CM

## 2016-10-01 NOTE — Assessment & Plan Note (Signed)
Stable. Still some mild limitation in range of motion. Discussed with patient about different treatment options. Patient has elected to continue with the conservative therapy because she has responded fairly well. Encourage her to continue the over-the-counter medications as well. Patient will follow-up with me again in 6-12 weeks.

## 2016-10-01 NOTE — Assessment & Plan Note (Signed)
Decision today to treat with OMT was based on Physical Exam  After verbal consent patient was treated with HVLA, ME, FPR techniques in cervical, thoracic, rib lumbar and sacral areas  Patient tolerated the procedure well with improvement in symptoms  Patient given exercises, stretches and lifestyle modifications  See medications in patient instructions if given  Patient will follow up in 6-12 weeks 

## 2016-10-01 NOTE — Patient Instructions (Signed)
Good to see you  Tina [name] is your friend.  pennsaid pinkie amount topically 2 times daily as needed on the toe Keep wearing the good shoes.  See me again in 6-7 weeks

## 2016-11-19 ENCOUNTER — Encounter: Payer: Self-pay | Admitting: Family Medicine

## 2016-11-19 ENCOUNTER — Ambulatory Visit (INDEPENDENT_AMBULATORY_CARE_PROVIDER_SITE_OTHER): Payer: Medicare Other | Admitting: Family Medicine

## 2016-11-19 VITALS — BP 124/80 | HR 73 | Ht 64.0 in | Wt 122.0 lb

## 2016-11-19 DIAGNOSIS — M999 Biomechanical lesion, unspecified: Secondary | ICD-10-CM

## 2016-11-19 DIAGNOSIS — M542 Cervicalgia: Secondary | ICD-10-CM | POA: Diagnosis not present

## 2016-11-19 NOTE — Patient Instructions (Signed)
Good to see you  Tina [name] is your friend.  Back to the routine.  See me again in 5 weeks!

## 2016-11-19 NOTE — Assessment & Plan Note (Signed)
Decision today to treat with OMT was based on Physical Exam  After verbal consent patient was treated with HVLA, ME, FPR techniques in cervical, thoracic, rib lumbar and sacral areas  Patient tolerated the procedure well with improvement in symptoms  Patient given exercises, stretches and lifestyle modifications  See medications in patient instructions if given  Patient will follow up in 4-8 weeks 

## 2016-11-19 NOTE — Assessment & Plan Note (Signed)
Continues to have some mild tightness. Still seems to be posture. Discussed ergonomics. Follow-up again in 4-8 weeks.

## 2016-11-19 NOTE — Progress Notes (Signed)
Tawana ScaleZach Raphel Stickles D.O. [facility] [address] [place], [place] [address] Phone: [phone] Subjective:    I'm seeing this patient by the request  of:  Panosh, Neta MendsWanda K, MD   CC: Upper back pain and low back pain  FAO:[id number]:Subjective  Tina [name] is a 71 y.o. female coming in with complaint of upper back and low back pain. Patient has had more muscle imbalances and poor posture. Attempts to go to 6-8 weeks. Patient had difficulty. Patient is having worsening pain.   Patient is having more tightness. Was out of the city. Patient was not doing the exercises regularly.    Past Medical History:  Diagnosis Date  . High triglycerides   . Hx of varicella    Past Surgical History:  Procedure Laterality Date  . CHOLECYSTECTOMY     Social History   Social History  . Marital status: Divorced    Spouse name: N/A  . Number of children: N/A  . 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 Smoking status: Former Smoker    Packs/day: 0.70    Years: 4.00  . Smokeless tobacco: Never Used     Comment: in her 5420's   . Alcohol use 0.5 oz/week    1 Standard drinks or equivalent per week     Comment: ocassionally  . Drug use: No  . Sexual activity: Not Asked   Other Topics Concern  . None   Social History Narrative    Now hhof 1 no pets    .    Mother  Moved down to friends home        Divorced one chid and grandchild   Neg ets firearms.    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    Exercises regularly      Neg tad    No Known Allergies Family History  Problem Relation Age of Onset  . Hypertension Mother   . Hyperlipidemia Mother   . Stroke Mother   . Diabetes Father   . Miscarriages / [place] Brother     Past medical history, social, surgical and family history all reviewed in electronic medical record.  No pertanent information  unless stated regarding to the chief complaint.   Review of Systems:Review of systems updated and as accurate as of 11/19/16  No headache, visual changes, nausea, vomiting, diarrhea, constipation, dizziness, abdominal pain, skin rash, fevers, chills, night sweats, weight loss, swollen lymph nodes, body aches, joint swelling, muscle aches, chest pain, shortness of breath, mood changes.   Objective  Blood pressure 124/80, pulse 73, height 5\' 4"  (1.626 m), weight 122 lb (55.3 kg), SpO2 98 %. Systems examined below as of 11/19/16   General: No apparent distress alert and oriented x3 mood and affect normal, dressed appropriately.  HEENT: Pupils equal, extraocular movements intact  Respiratory: Patient's speak in full sentences and does not appear short of breath  Cardiovascular: No lower extremity edema, non tender, no erythema  Skin: Warm dry intact with no signs of infection or rash on extremities or on axial skeleton.  Abdomen: Soft nontender  Neuro: Cranial nerves II through XII are intact, neurovascularly intact in all extremities with 2+ DTRs and 2+ pulses.  Lymph: No lymphadenopathy of posterior or anterior cervical chain or axillae bilaterally.  Gait normal  with good balance and coordination.  MSK:  Non tender with full range of motion and good stability and symmetric strength and tone of shoulders, elbows, wrist, hip, knee and ankles bilaterally. Mild arthritic changes of multiple joints Neck: Inspection mild loss of lordosis. No palpable stepoffs. Negative Spurling's maneuver. Mild limitation in left-sided side bending and rotation Grip strength and sensation normal in bilateral hands Strength good C4 to T1 distribution No sensory change to C4 to T1 Negative Hoffman sign bilaterally Reflexes normal  Low back exam shows the patient has mild tenderness over the sacroiliac jointbut otherwise unremarkable  Osteopathic findings C2 flexed rotated and side bent right C4 flexed rotated  and side bent left C6 flexed rotated and side bent left T4 extended rotated and side bent right inhaled rib L3 flexed rotated and side bent right Sacrum right on right    Impression and Recommendations:     This case required medical decision making of moderate complexity.      Note: This dictation was prepared with Dragon dictation along with smaller phrase technology. Any transcriptional errors that result from this process are unintentional.

## 2016-12-25 ENCOUNTER — Ambulatory Visit: Payer: Medicare Other | Admitting: Family Medicine

## 2016-12-26 ENCOUNTER — Ambulatory Visit (INDEPENDENT_AMBULATORY_CARE_PROVIDER_SITE_OTHER): Payer: Medicare Other | Admitting: Family Medicine

## 2016-12-26 ENCOUNTER — Encounter: Payer: Self-pay | Admitting: Family Medicine

## 2016-12-26 VITALS — BP 110/80 | HR 76 | Ht 64.0 in | Wt 120.0 lb

## 2016-12-26 DIAGNOSIS — M542 Cervicalgia: Secondary | ICD-10-CM | POA: Diagnosis not present

## 2016-12-26 DIAGNOSIS — M999 Biomechanical lesion, unspecified: Secondary | ICD-10-CM

## 2016-12-26 NOTE — Assessment & Plan Note (Signed)
Decision today to treat with OMT was based on Physical Exam  After verbal consent patient was treated with HVLA, ME, FPR techniques in cervical, thoracic, rib areas  Patient tolerated the procedure well with improvement in symptoms  Patient given exercises, stretches and lifestyle modifications  See medications in patient instructions if given  Patient will follow up in 4-8 weeks 

## 2016-12-26 NOTE — Patient Instructions (Signed)
You are doing awesome Keep it up  Watch the left side a little bit more during the day  See me again in 4-5 weeks right before the trip!!!

## 2016-12-26 NOTE — Progress Notes (Signed)
Tawana ScaleZach Smith D.O. [facility] [address] [place], [place] [address] Phone: [phone] Subjective:    I'm seeing this patient by the request  of:    CC: Neck and back pain  BJY:NWGNFAOZHYHPI:Subjective  Tina [name] is a 71 y.o. female coming in with complaint of neck and back pain. Has been doing very well. Patient states that as long as she does he exercises she seems to do well. Continues to have more of a left-sided shoulder and neck pain when it does occur. Describes the pain as a dull, throbbing aching sensation. Not stopping her from activity but can be irritating. Has responded well to manipulation in the past.      Past Medical History:  Diagnosis Date  . High triglycerides   . Hx of varicella    Past Surgical History:  Procedure Laterality Date  . CHOLECYSTECTOMY     Social History   Social History  . Marital status: Divorced    Spouse name: N/A  . Number of children: N/A  . 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 Smoking status: Former Smoker    Packs/day: 0.70    Years: 4.00  . Smokeless tobacco: Never Used     Comment: in her 1820's   . Alcohol use 0.5 oz/week    1 Standard drinks or equivalent per week     Comment: ocassionally  . Drug use: No  . Sexual activity: Not Asked   Other Topics Concern  . None   Social History Narrative    Now hhof 1 no pets    .    Mother  Moved down to friends home        Divorced one chid and grandchild   Neg ets firearms.    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    Exercises regularly      Neg tad    No Known Allergies Family History  Problem Relation Age of Onset  . Hypertension Mother   . Hyperlipidemia Mother   . Stroke Mother   . Diabetes Father   . Miscarriages / [place] Brother      Past medical history, social, surgical and family history all reviewed in  electronic medical record.  No pertanent information unless stated regarding to the chief complaint.   Review of Systems:Review of systems updated and as accurate as of 12/26/16  No headache, visual changes, nausea, vomiting, diarrhea, constipation, dizziness, abdominal pain, skin rash, fevers, chills, night sweats, weight loss, swollen lymph nodes, body aches, joint swelling, muscle aches, chest pain, shortness of breath, mood changes.   Objective  Blood pressure 110/80, pulse 76, height 5\' 4"  (1.626 m), weight 120 lb (54.4 kg), SpO2 97 %. Systems examined below as of 12/26/16   General: No apparent distress alert and oriented x3 mood and affect normal, dressed appropriately.  HEENT: Pupils equal, extraocular movements intact  Respiratory: Patient's speak in full sentences and does not appear short of breath  Cardiovascular: No lower extremity edema, non tender, no erythema  Skin: Warm dry intact with no signs of infection or rash on extremities or on axial skeleton.  Abdomen: Soft nontender  Neuro: Cranial nerves II through XII are intact, neurovascularly intact in all extremities with 2+ DTRs and 2+ pulses.  Lymph: No  lymphadenopathy of posterior or anterior cervical chain or axillae bilaterally.  Gait normal with good balance and coordination.  MSK:  Non tender with full range of motion and good stability and symmetric strength and tone of shoulders, elbows, wrist, hip, knee and ankles bilaterally.  Neck: Inspection unremarkable. No palpable stepoffs. Negative Spurling's maneuver. Mild limitation lacking the last 5 of rotation to the left and the last 5 of side bending. Grip strength and sensation normal in bilateral hands Strength good C4 to T1 distribution No sensory change to C4 to T1 Negative Hoffman sign bilaterally Reflexes normal  Osteopathic findings C2 flexed rotated and side bent right C4 flexed rotated and side bent left T3 extended rotated and side bent right  inhaled third rib T7 extended rotated and side bent left     Impression and Recommendations:     This case required medical decision making of moderate complexity.      Note: This dictation was prepared with Dragon dictation along with smaller phrase technology. Any transcriptional errors that result from this process are unintentional.

## 2016-12-26 NOTE — Assessment & Plan Note (Signed)
Stable overall. I do think that there is some mild underlying arthritis that could be contributing. No radicular symptoms. Patient does have a lot of different stress including the care of her ailing mother. We discussed continuing the home exercises and working on posture and ergonomics throughout the day. Still responds well to manipulation and will follow-up again in 4-8 weeks.

## 2017-01-11 ENCOUNTER — Encounter: Payer: Self-pay | Admitting: Internal Medicine

## 2017-01-30 ENCOUNTER — Ambulatory Visit (INDEPENDENT_AMBULATORY_CARE_PROVIDER_SITE_OTHER): Payer: Medicare Other | Admitting: Family Medicine

## 2017-01-30 ENCOUNTER — Encounter: Payer: Self-pay | Admitting: Family Medicine

## 2017-01-30 VITALS — BP 114/70 | HR 78 | Ht 64.0 in | Wt 121.0 lb

## 2017-01-30 DIAGNOSIS — M542 Cervicalgia: Secondary | ICD-10-CM | POA: Diagnosis not present

## 2017-01-30 DIAGNOSIS — M9981 Other biomechanical lesions of cervical region: Secondary | ICD-10-CM | POA: Diagnosis not present

## 2017-01-30 DIAGNOSIS — M999 Biomechanical lesion, unspecified: Secondary | ICD-10-CM

## 2017-01-30 MED ORDER — PREDNISONE 50 MG PO TABS: 50.0000 mg | ORAL_TABLET | Freq: Every day | ORAL | 0 refills | Status: DC

## 2017-01-30 NOTE — Patient Instructions (Signed)
Good to see you  Tina [name] is your friend.  Have a great trip  If in severe pain Prednisone daily for 5 days.  Duexis otherwise up to 3 times a day  See me again in 4 weeks

## 2017-01-30 NOTE — Assessment & Plan Note (Signed)
Patient is a neck pain on the left side. Discussed with patient at great length. Patient is going to be doing a lot of hiking. Given prednisone for any break through pain. We discussed icing regimen. Discussed proper ergonomics with hiking positioning. Follow-up again in 4 weeks and patient returns.

## 2017-01-30 NOTE — Assessment & Plan Note (Signed)
Decision today to treat with OMT was based on Physical Exam  After verbal consent patient was treated with HVLA, ME, FPR techniques in cervical, thoracic, rib, lumbar and sacral areas  Patient tolerated the procedure well with improvement in symptoms  Patient given exercises, stretches and lifestyle modifications  See medications in patient instructions if given  Patient will follow up in 4 weeks 

## 2017-01-30 NOTE — Progress Notes (Signed)
[name] [address] [place], [place] [address] Phone: [phone] Subjective:     CC: Neck and back pain follow-up  BJY:NWGNFAOZHY  Tina [name] is a 71 y.o. female coming in with complaint of neck and back pain. Doing well Some tightness. Patient will be traveling for the next 3 weeks. Doing a significant amount hiking. Patient is excited about this but wants to make sure that she has in proper alignment and feeling good before she goes. Some tightness and some increase stress still taking care of her father.      Past Medical History:  Diagnosis Date  . High triglycerides   . Hx of varicella    Past Surgical History:  Procedure Laterality Date  . CHOLECYSTECTOMY     Social History   Social History  . Marital status: Divorced    Spouse name: N/A  . Number of children: N/A  . 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 Smoking status: Former Smoker    Packs/day: 0.70    Years: 4.00  . Smokeless tobacco: Never Used     Comment: in her 64's   . Alcohol use 0.5 oz/week    1 Standard drinks or equivalent per week     Comment: ocassionally  . Drug use: No  . Sexual activity: Not on file   Other Topics Concern  . Not on file   Social History Narrative    Now hhof 1 no pets    .    Mother  Moved down to friends home        Divorced one chid and grandchild   Neg ets firearms.    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    Exercises regularly      Neg tad    No Known Allergies Family History  Problem Relation Age of Onset  . Hypertension Mother   . Hyperlipidemia Mother   . Stroke Mother   . Diabetes Father   . Miscarriages / [place] Brother      Past medical history, social, surgical and family history all reviewed in electronic medical record.  No pertanent information unless stated  regarding to the chief complaint.   Review of Systems:Review of systems updated and as accurate as of 01/30/17  No headache, visual changes, nausea, vomiting, diarrhea, constipation, dizziness, abdominal pain, skin rash, fevers, chills, night sweats, weight loss, swollen lymph nodes, body aches, joint swelling, chest pain, shortness of breath, mood changes. Positive muscle aches  Objective  There were no vitals taken for this visit. Systems examined below as of 01/30/17   General: No apparent distress alert and oriented x3 mood and affect normal, dressed appropriately.  HEENT: Pupils equal, extraocular movements intact  Respiratory: Patient's speak in full sentences and does not appear short of breath  Cardiovascular: No lower extremity edema, non tender, no erythema  Skin: Warm dry intact with no signs of infection or rash on extremities or on axial skeleton.  Abdomen: Soft nontender  Neuro: Cranial nerves II through XII are intact, neurovascularly intact in all extremities with 2+ DTRs and 2+ pulses.  Lymph: No lymphadenopathy of posterior or anterior cervical chain or axillae bilaterally.  Gait normal with good balance and coordination.  MSK:  Non tender with full range  of motion and good stability and symmetric strength and tone of shoulders, elbows, wrist, hip, knee and ankles bilaterally.  Neck: Inspection mild loss of lordosis. No palpable stepoffs. Negative Spurling's maneuver. Full neck range of motion Grip strength and sensation normal in bilateral hands Strength good C4 to T1 distribution No sensory change to C4 to T1 Negative Hoffman sign bilaterally Reflexes normal Mild increasing kyphosis with some tightness of the trapezius bilaterally  Osteopathic findings C2 flexed rotated and side bent right C4 flexed rotated and side bent left T3 extended rotated and side bent right inhaled third rib T6 extended rotated and side bent left with inhaled rib L2 flexed rotated and  side bent right Sacrum right on right    Impression and Recommendations:     This case required medical decision making of moderate complexity.      Note: This dictation was prepared with Dragon dictation along with smaller phrase technology. Any transcriptional errors that result from this process are unintentional.

## 2017-02-28 ENCOUNTER — Encounter: Payer: Self-pay | Admitting: Family Medicine

## 2017-02-28 ENCOUNTER — Ambulatory Visit: Payer: Medicare Other | Admitting: Family Medicine

## 2017-02-28 VITALS — BP 138/82 | HR 62 | Ht 64.0 in | Wt 122.0 lb

## 2017-02-28 DIAGNOSIS — M9901 Segmental and somatic dysfunction of cervical region: Secondary | ICD-10-CM

## 2017-02-28 DIAGNOSIS — M542 Cervicalgia: Secondary | ICD-10-CM | POA: Diagnosis not present

## 2017-02-28 DIAGNOSIS — M999 Biomechanical lesion, unspecified: Secondary | ICD-10-CM | POA: Diagnosis not present

## 2017-02-28 NOTE — Patient Instructions (Signed)
Good to see you  Gustavus Bryantce is your friend.  Stay active.  Get back to your routine.  See me again in 8 weeks

## 2017-02-28 NOTE — Progress Notes (Signed)
Tina [name] D.O. [facility] [address] [place], [place] [address] Phone: [phone](336) [phone] Subjective:    I'm seeing this patient by the request  of:  Tina [name], Neta MendsWanda K, MD   CC: neck pain   WFU:[id number]:Subjective  Tina PaJulie [name] is a 71 y.o. female coming in for follow up for neck pain. She just got back from a 3 weeks trip to [place] where she did a lot of hiking. She said that she used her pillow to stabilize her head on the plane and in the car as the roads were bumpy. Overall she is surprised with how well her neck felt during the trip. Patient states some mild numbness but seems to be intermittent.     Past Medical History:  Diagnosis Date  . High triglycerides   . Hx of varicella    Past Surgical History:  Procedure Laterality Date  . CHOLECYSTECTOMY     Social History   Socioeconomic History  . Marital status: Divorced    Spouse name: Not on file  . Number of children: Not on file  . Years of education: Not on file  . Highest education level: Not on file  Social Needs  . Financial resource strain: Not on file  . Food insecurity - worry: Not on file  . Food insecurity - inability: Not on file  . Transportation needs - medical: Not on file  . Transportation needs - non-medical: Not on file  Occupational History  . Occupation: Professor    Employer: [employer]  Tobacco Use  . Smoking status: Former Smoker    Packs/day: 0.70    Years: 4.00    Pack years: 2.80  . Smokeless tobacco: Never Used  . Tobacco comment: in her 3920's   Substance and Sexual Activity  . Alcohol use: Yes    Alcohol/week: 0.5 oz    Types: 1 Standard drinks or equivalent per week    Comment: ocassionally  . Drug use: No  . Sexual activity: Not on file  Other Topics Concern  . 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    Mother  Moved down to friends home        Divorced one chid and grandchild   Neg ets firearms.    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    Retire  this summer 16   Educ PHD.    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    Exercises regularly      Neg tad    No Known Allergies Family History  Problem Relation Age of Onset  . Hypertension Mother   . Hyperlipidemia Mother   . Stroke Mother   . Diabetes Father   . Miscarriages / [place]      Past medical history, social, surgical and family history all reviewed in electronic medical record.  No pertanent information unless stated regarding to the chief complaint.   Review of Systems:Review of systems updated and as accurate as of 02/28/17  No headache, visual changes, nausea, vomiting, diarrhea, constipation, dizziness, abdominal pain, skin rash, fevers, chills, night sweats, weight loss, swollen lymph nodes, body aches, joint swelling, muscle aches, chest pain, shortness of breath, mood changes.   Objective  There were no vitals taken for this visit. Systems examined below as of 02/28/17   General: No apparent distress alert and oriented x3 mood and affect normal, dressed appropriately.  HEENT: Pupils equal,  extraocular movements intact  Respiratory: Patient's speak in full sentences and does not appear short of breath  Cardiovascular: No lower extremity edema, non tender, no erythema  Skin: Warm dry intact with no signs of infection or rash on extremities or on axial skeleton.  Abdomen: Soft nontender  Neuro: Cranial nerves II through XII are intact, neurovascularly intact in all extremities with 2+ DTRs and 2+ pulses.  Lymph: No lymphadenopathy of posterior or anterior cervical chain or axillae bilaterally.  Gait normal with good balance and coordination.  MSK:  Non tender with full range of motion and good stability and symmetric strength and tone of shoulders, elbows, wrist, hip, knee and ankles bilaterally.  Neck: Inspection mild loss of lordosis. No palpable stepoffs. Negative Spurling's maneuver. \5 degrees of extension and sidebending bilaterally Grip strength  and sensation normal in bilateral hands Strength good C4 to T1 distribution No sensory change to C4 to T1 Negative Hoffman sign bilaterally Reflexes normal  Osteopathic findings Cervical C2 flexed rotated and side bent right C4 flexed rotated and side bent left C6 flexed rotated and side bent left T3 extended rotated and side bent right inhaled third rib    Impression and Recommendations:     This case required medical decision making of moderate complexity.      Note: This dictation was prepared with Dragon dictation along with smaller phrase technology. Any transcriptional errors that result from this process are unintentional.

## 2017-02-28 NOTE — Assessment & Plan Note (Signed)
Doing much better overall.  Patient was to start increasing activity.  Patient wants to do icing regimen and home exercises.  Patient has been doing this occasionally.  Patient's doing well with osteopathic manipulation intervals.  Patient will come back and see me again in 8-week interval.

## 2017-02-28 NOTE — Assessment & Plan Note (Signed)
Decision today to treat with OMT was based on Physical Exam  After verbal consent patient was treated with HVLA, ME, FPR techniques in cervical, thoracic, rib areas  Patient tolerated the procedure well with improvement in symptoms  Patient given exercises, stretches and lifestyle modifications  See medications in patient instructions if given  Patient will follow up in 8 weeks 

## 2017-04-24 NOTE — Progress Notes (Signed)
[name] [address] [place], [place] [address] Phone: [phone] Subjective:      CC: Neck and back pain  [id number]  Tina [name] is a 72 y.o. female coming in with complaint of neck and back pain.  Seems to be having more muscle imbalances and poor posture.  Has responded fairly well to osteopathic manipulation.  Was having increasing stress as well last visit.  Patient states overall she has been doing relatively well.  Continues to care for her 43 year old father.  He seems to be doing relatively well.  Patient states some mild back pain but nothing severe.  Neck pain seems to be also there but not anything that is stopping her from activities.  Does feel tired recently.        Past Medical History:  Diagnosis Date  . High triglycerides   . Hx of varicella    Past Surgical History:  Procedure Laterality Date  . CHOLECYSTECTOMY     Social History   Socioeconomic History  . Marital status: Divorced    Spouse name: None  . Number of children: None  . Years of education: None  . Highest education level: None  Social Needs  . Financial resource strain: None  . Food insecurity - worry: None  . Food insecurity - inability: None  . Transportation needs - medical: None  . Transportation needs - non-medical: None  Occupational History  . Occupation: Professor    Employer: [employer]  Tobacco Use  . Smoking status: Former Smoker    Packs/day: 0.70    Years: 4.00    Pack years: 2.80  . Smokeless tobacco: Never Used  . Tobacco comment: in her 23's   Substance and Sexual Activity  . Alcohol use: Yes    Alcohol/week: 0.5 oz    Types: 1 Standard drinks or equivalent per week    Comment: ocassionally  . Drug use: No  . Sexual activity: None  Other Topics Concern  . None  Social History Narrative    Now hhof 1 no pets    .    Mother  Moved down to friends home        Divorced one chid and grandchild   Neg ets firearms.    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    Exercises regularly      Neg tad    No Known Allergies Family History  Problem Relation Age of Onset  . Hypertension Mother   . Hyperlipidemia Mother   . Stroke Mother   . Diabetes Father   . Miscarriages / [place] Brother      Past medical history, social, surgical and family history all reviewed in electronic medical record.  No pertanent information unless stated regarding to the chief complaint.   Review of Systems:Review of systems updated and as accurate as of 04/25/17  No headache, visual changes, nausea, vomiting, diarrhea, constipation, dizziness, abdominal pain, skin rash, fevers, chills, night sweats, weight loss, swollen lymph nodes, body aches, joint swelling, muscle aches, chest pain, shortness of breath, mood changes.   Objective  Blood pressure 140/78, pulse 69, height 5\' 3"  (1.6 m), weight 125 lb (56.7 kg), SpO2 98 %. Systems examined below as of 04/25/17   General: No apparent distress alert and oriented x3 mood and affect normal, dressed appropriately.  HEENT: Pupils  equal, extraocular movements intact  Respiratory: Patient's speak in full sentences and does not appear short of breath  Cardiovascular: No lower extremity edema, non tender, no erythema  Skin: Warm dry intact with no signs of infection or rash on extremities or on axial skeleton.  Abdomen: Soft nontender  Neuro: Cranial nerves II through XII are intact, neurovascularly intact in all extremities with 2+ DTRs and 2+ pulses.  Lymph: No lymphadenopathy of posterior or anterior cervical chain or axillae bilaterally.  Gait normal with good balance and coordination.  MSK:  Non tender with full range of motion and good stability and symmetric strength and tone of shoulders, elbows, wrist, hip, knee and ankles bilaterally.  Neck: Inspection mild loss of lordosis. No palpable stepoffs. Negative  Spurling's maneuver. Range of motion lacks last 5 degrees of sidebending bilaterally Grip strength and sensation normal in bilateral hands Strength good C4 to T1 distribution No sensory change to C4 to T1 Negative Hoffman sign bilaterally Reflexes normal Mild scapular dyskinesis noted  Back exam shows some mild positive pain over the right sacroiliac joint with a positive Pearlean BrownieFaber but negative straight leg test.  Minimal decrease in extension of the back  Osteopathic findings C2 flexed rotated and side bent right C5 flexed rotated and side bent left T2 extended rotated and side bent left inhaled rib T9 extended rotated and side bent left L2 flexed rotated and side bent right Sacrum right on right     Impression and Recommendations:     This case required medical decision making of moderate complexity.      Note: This dictation was prepared with Dragon dictation along with smaller phrase technology. Any transcriptional errors that result from this process are unintentional.

## 2017-04-25 ENCOUNTER — Encounter: Payer: Self-pay | Admitting: Family Medicine

## 2017-04-25 ENCOUNTER — Ambulatory Visit: Payer: Medicare Other | Admitting: Family Medicine

## 2017-04-25 VITALS — BP 140/78 | HR 69 | Ht 63.0 in | Wt 125.0 lb

## 2017-04-25 DIAGNOSIS — M542 Cervicalgia: Secondary | ICD-10-CM

## 2017-04-25 DIAGNOSIS — M9901 Segmental and somatic dysfunction of cervical region: Secondary | ICD-10-CM | POA: Diagnosis not present

## 2017-04-25 DIAGNOSIS — M999 Biomechanical lesion, unspecified: Secondary | ICD-10-CM

## 2017-04-25 MED ORDER — TIZANIDINE HCL 4 MG PO TABS: 4.0000 mg | ORAL_TABLET | Freq: Every evening | ORAL | 2 refills | Status: DC

## 2017-04-25 NOTE — Patient Instructions (Signed)
Good to see you  If you have a flare consider zanaflex nightly for 3 nights Ibuprofen 800g up to 3 times a day and ice regularly every 4 hours for 20 minutes Then if around longer then that call us or write me and we can see you or do prednisone  Antonietta JewelKevin Burroughs in [place] Or have him call me at [phone] if needed

## 2017-04-25 NOTE — Assessment & Plan Note (Signed)
More of the tightness in the left side with a slipped rib syndrome.  We discussed icing regimen we discussed posture and ergonomics.  Patient does have muscle relaxer for any breakthrough pain.  Patient did have a mild exacerbation from last exam.  Patient though will follow up with me in 6-8 weeks

## 2017-04-25 NOTE — Assessment & Plan Note (Signed)
Decision today to treat with OMT was based on Physical Exam  After verbal consent patient was treated with HVLA, ME, FPR techniques in cervical, thoracic, riblumbar and sacral areas  Patient tolerated the procedure well with improvement in symptoms  Patient given exercises, stretches and lifestyle modifications  See medications in patient instructions if given  Patient will follow up in 6-9 weeks

## 2017-06-05 NOTE — Progress Notes (Signed)
Tina [name] D.O. [facility] [address] [place], [place] [address] Phone: [phone](336) [phone] Subjective:     CC: Neck pain follow-up  BJY:[id number]:Subjective  Tina [name] is a 72 y.o. female coming in with complaint of neck pain follow-up. She has had an increase in pain over the past month. She said that being sedentary causes more pain. She does have relief with physical activity. She has a few tight spots on the left side.  Cramping is severe.  But this only happens occasionally.       Past Medical History:  Diagnosis Date  . High triglycerides   . Hx of varicella    Past Surgical History:  Procedure Laterality Date  . CHOLECYSTECTOMY     Social History   Socioeconomic History  . Marital status: Divorced    Spouse name: Not on file  . Number of children: Not on file  . Years of education: Not on file  . Highest education level: Not on file  Social Needs  . Financial resource strain: Not on file  . Food insecurity - worry: Not on file  . Food insecurity - inability: Not on file  . Transportation needs - medical: Not on file  . Transportation needs - non-medical: Not on file  Occupational History  . Occupation: Professor    Employer: [employer]  Tobacco Use  . Smoking status: Former Smoker    Packs/day: 0.70    Years: 4.00    Pack years: 2.80  . Smokeless tobacco: Never Used  . Tobacco comment: in her 5020's   Substance and Sexual Activity  . Alcohol use: Yes    Alcohol/week: 0.5 oz    Types: 1 Standard drinks or equivalent per week    Comment: ocassionally  . Drug use: No  . Sexual activity: Not on file  Other Topics Concern  . 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    Mother  Moved down to friends home        Divorced one chid and grandchild   Neg ets firearms.    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    Exercises regularly      Neg tad    No Known  Allergies Family History  Problem Relation Age of Onset  . Hypertension Mother   . Hyperlipidemia Mother   . Stroke Mother   . Diabetes Father   . Miscarriages / [place] Brother      Past medical history, social, surgical and family history all reviewed in electronic medical record.  No pertanent information unless stated regarding to the chief complaint.   Review of Systems:Review of systems updated and as accurate as of 06/06/17  No  visual changes, nausea, vomiting, diarrhea, constipation, dizziness, abdominal pain, skin rash, fevers, chills, night sweats, weight loss, swollen lymph nodes, body aches, joint swelling, chest pain, shortness of breath, mood changes.  Positive muscle aches and intermittent headaches  Objective  Blood pressure 118/82, pulse 68, weight 125 lb (56.7 kg), SpO2 99 %. Systems examined below as of 06/06/17   General: No apparent distress alert and oriented x3 mood and affect normal, dressed appropriately.  HEENT: Pupils equal, extraocular movements intact  Respiratory: Patient's speak in full sentences and does not appear short of breath  Cardiovascular: No lower extremity  edema, non tender, no erythema  Skin: Warm dry intact with no signs of infection or rash on extremities or on axial skeleton.  Abdomen: Soft nontender  Neuro: Cranial nerves II through XII are intact, neurovascularly intact in all extremities with 2+ DTRs and 2+ pulses.  Lymph: No lymphadenopathy of posterior or anterior cervical chain or axillae bilaterally.  Gait normal with good balance and coordination.  MSK:  Non tender with full range of motion and good stability and symmetric strength and tone of shoulders, elbows, wrist, hip, knee and ankles bilaterally.  Neck: Inspection loss of lordosis. No palpable stepoffs. Negative Spurling's maneuver. Mild limitation of 5 degrees in all planes.  More on the left than the right Grip strength and sensation normal in bilateral  hands Strength good C4 to T1 distribution No sensory change to C4 to T1 Negative Hoffman sign bilaterally Reflexes normal Mild tightness of the left  Osteopathic findings C2 flexed rotated and side bent right C7 flexed rotated and side bent left T3 extended rotated and side bent left inhaled third rib T9 extended rotated and side bent left      Impression and Recommendations:     This case required medical decision making of moderate complexity.      Note: This dictation was prepared with Dragon dictation along with smaller phrase technology. Any transcriptional errors that result from this process are unintentional.

## 2017-06-06 ENCOUNTER — Ambulatory Visit: Payer: Medicare Other | Admitting: Family Medicine

## 2017-06-06 ENCOUNTER — Encounter: Payer: Self-pay | Admitting: Family Medicine

## 2017-06-06 VITALS — BP 118/82 | HR 68 | Wt 125.0 lb

## 2017-06-06 DIAGNOSIS — M542 Cervicalgia: Secondary | ICD-10-CM

## 2017-06-06 DIAGNOSIS — M9901 Segmental and somatic dysfunction of cervical region: Secondary | ICD-10-CM | POA: Diagnosis not present

## 2017-06-06 DIAGNOSIS — M999 Biomechanical lesion, unspecified: Secondary | ICD-10-CM

## 2017-06-06 NOTE — Assessment & Plan Note (Signed)
Still there.  Known underlying arthritis but very mild overall.  Minimal radiation of pain.  Responding well to the home exercises, yoga, as well as manipulation.  No significant change in management.  Did have some more tightness than previous exams and will follow-up again 4 weeks

## 2017-06-06 NOTE — Assessment & Plan Note (Signed)
Decision today to treat with OMT was based on Physical Exam  After verbal consent patient was treated with HVLA, ME, FPR techniques in cervical, thoracic, rib areas  Patient tolerated the procedure well with improvement in symptoms  Patient given exercises, stretches and lifestyle modifications  See medications in patient instructions if given  Patient will follow up in 4 weeks 

## 2017-06-06 NOTE — Patient Instructions (Signed)
Good to see you  Tina [name] is your friend.  Stay active  Tennis ball on the rib in the back and take a 5# weight and try to localize for and just breath, sometimes you can get the rib in place Keep doing what you are doing.  See me again in 4-5 weeks.

## 2017-07-03 NOTE — Progress Notes (Signed)
[name] [address] [place], [place] [address] Phone: [phone] Subjective:     CC: Back and neck pain follow-up  BJY:[id number]  Tina [name] is a 72 y.o. female coming in with complaint of back and neck pain follow-up.  Also was having worsening side left neck pain.  Had responded well to osteopathic manipulation but went back to 4-week intervals secondary to increasing discomfort and pain.  Patient states overall she has been doing relatively well but did have the flu 2 weeks ago.  Stopped her from doing exercises regularly.  Had some tightness initially but then seemed to get better.  Patient states that the massage has helped as well.     Past Medical History:  Diagnosis Date  . High triglycerides   . Hx of varicella    Past Surgical History:  Procedure Laterality Date  . CHOLECYSTECTOMY     Social History   Socioeconomic History  . Marital status: Divorced    Spouse name: None  . Number of children: None  . Years of education: None  . Highest education level: None  Social Needs  . Financial resource strain: None  . Food insecurity - worry: None  . Food insecurity - inability: None  . Transportation needs - medical: None  . Transportation needs - non-medical: None  Occupational History  . Occupation: Professor    Employer: [employer]  Tobacco Use  . Smoking status: Former Smoker    Packs/day: 0.70    Years: 4.00    Pack years: 2.80  . Smokeless tobacco: Never Used  . Tobacco comment: in her 32's   Substance and Sexual Activity  . Alcohol use: Yes    Alcohol/week: 0.5 oz    Types: 1 Standard drinks or equivalent per week    Comment: ocassionally  . Drug use: No  . Sexual activity: None  Other Topics Concern  . None  Social History Narrative    Now hhof 1 no pets    .    Mother  Moved down to friends home        Divorced one chid and grandchild   Neg ets firearms.    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    Exercises regularly      Neg tad    No Known Allergies Family History  Problem Relation Age of Onset  . Hypertension Mother   . Hyperlipidemia Mother   . Stroke Mother   . Diabetes Father   . Miscarriages / [place] Brother      Past medical history, social, surgical and family history all reviewed in electronic medical record.  No pertanent information unless stated regarding to the chief complaint.   Review of Systems:Review of systems updated and as accurate as of 07/04/17  No headache, visual changes, nausea, vomiting, diarrhea, constipation, dizziness, abdominal pain, skin rash, fevers, chills, night sweats, weight loss, swollen lymph nodes, body aches, joint swelling, muscle aches, chest pain, shortness of breath, mood changes.   Objective  Blood pressure 130/80, pulse 61, height 5\' 3"  (1.6 m), weight 125 lb (56.7 kg), SpO2 99 %. Systems examined below as of 07/04/17   General: No apparent distress alert and oriented x3 mood and affect normal, dressed appropriately.  HEENT: Pupils equal, extraocular movements intact  Respiratory: Patient's speak in full sentences and does not  appear short of breath  Cardiovascular: No lower extremity edema, non tender, no erythema  Skin: Warm dry intact with no signs of infection or rash on extremities or on axial skeleton.  Abdomen: Soft nontender  Neuro: Cranial nerves II through XII are intact, neurovascularly intact in all extremities with 2+ DTRs and 2+ pulses.  Lymph: No lymphadenopathy of posterior or anterior cervical chain or axillae bilaterally.  Gait normal with good balance and coordination.  MSK:  Non tender with full range of motion and good stability and symmetric strength and tone of shoulders, elbows, wrist, hip, knee and ankles bilaterally.  Neck: Inspection mild loss of lordosis. No palpable stepoffs. Negative Spurling's maneuver. Full neck range of  motion Grip strength and sensation normal in bilateral hands Strength good C4 to T1 distribution No sensory change to C4 to T1 Negative Hoffman sign bilaterally Reflexes normal Mild tightness in the left trapezius  Back exam shows some mild tightness of the right side.  More than the paraspinal musculature at L5-S1.  Mild positive Faber test.  Negative straight leg test.  4 out of 5 strength of the hip abductors but otherwise symmetric strength  Osteopathic findings C2 flexed rotated and side bent right C4 flexed rotated and side bent left T3 extended rotated and side bent right inhaled third rib T6 extended rotated and side bent left Sacrum right on right    Impression and Recommendations:     This case required medical decision making of moderate complexity.      Note: This dictation was prepared with Dragon dictation along with smaller phrase technology. Any transcriptional errors that result from this process are unintentional.

## 2017-07-04 ENCOUNTER — Ambulatory Visit: Payer: Medicare Other | Admitting: Family Medicine

## 2017-07-04 ENCOUNTER — Encounter: Payer: Self-pay | Admitting: Family Medicine

## 2017-07-04 VITALS — BP 130/80 | HR 61 | Ht 63.0 in | Wt 125.0 lb

## 2017-07-04 DIAGNOSIS — M542 Cervicalgia: Secondary | ICD-10-CM | POA: Diagnosis not present

## 2017-07-04 DIAGNOSIS — M999 Biomechanical lesion, unspecified: Secondary | ICD-10-CM

## 2017-07-04 NOTE — Patient Instructions (Signed)
Good to see you  Ice is your friend Keep it up  See me again in 4-6 weeks.  

## 2017-07-04 NOTE — Assessment & Plan Note (Signed)
Continues to have the tightness of the left side.  Responding fairly well to osteopathic manipulation.  Not taking any prescription medications for pain at this time.  Discussed posture, ergonomics and patient will follow up with me again in 4-6 weeks

## 2017-07-04 NOTE — Assessment & Plan Note (Signed)
Decision today to treat with OMT was based on Physical Exam  After verbal consent patient was treated with HVLA, ME, FPR techniques in cervical, thoracic, rib, lumbar and sacral areas  Patient tolerated the procedure well with improvement in symptoms  Patient given exercises, stretches and lifestyle modifications  See medications in patient instructions if given  Patient will follow up in 4-6 weeks 

## 2017-07-26 NOTE — Progress Notes (Signed)
Chief Complaint  Patient presents with  . Annual Exam    Pt denies any current concerns other thann increased allergies.     HPI: Tina [name] 72 y.o. comes in today for Preventive Medicare exam/ wellness visit .Since last visit.  Allergies  zyrtec   . Some dec hearing  At times  Now claritin  nottaking every day nasal cortisone.   With concern of  fam hx dm risk etc  Has derm  ? About  How often mammo?  May have desne breast  Seeing dr Tamala Julian and helpful for ms issues  . continuing to exercise   Health Maintenance  Topic Date Due  . Hepatitis C Screening  1945-09-20  . URINE MICROALBUMIN  07/11/2016  . OPHTHALMOLOGY EXAM  11/21/2016  . HEMOGLOBIN A1C  01/25/2017  . FOOT EXAM  07/26/2017  . INFLUENZA VACCINE  11/21/2017  . MAMMOGRAM  05/03/2018  . COLONOSCOPY  09/13/2022  . TETANUS/TDAP  11/22/2026  . DEXA SCAN  Completed  . PNA vac Low Risk Adult  Completed   Health Maintenance Review LIFESTYLE:  Exercise:   Routine 3 x per week  gym and yoga  10 k per day steps  Tobacco/ETS: no Alcohol:   Wine  Several per week.  Sugar beverages: no Sleep:   7.  Drug use: no HH: 1 plus dog  Episodic  And  Care grandfather and help with family  Allergies  Stopped up   Hearing:  Ok   Vision:  No limitations at present . Last eye check UTD glasses   Safety:  Has smoke detector and wears seat belts.  No firearms. No excess sun exposure. Sees dentist regularly.  Falls: no  Memory: Felt to be good  , no concern from her or her family.  Depression: No anhedonia unusual crying or depressive symptoms  Nutrition: Eats well balanced diet; adequate calcium and vitamin D. No swallowing chewing problems.  Injury: no major injuries in the last six months.  Other healthcare providers:  Reviewed today .  Preventive parameters: up-to-date  Reviewed   ADLS:   There are no problems or need for assistance  driving, feeding, obtaining food, dressing, toileting and bathing, managing money  using phone. She is independent.   ROS:  GEN/ HEENT: No fever, significant weight changes sweats headaches vision problems hearing changes, CV/ PULM; No chest pain shortness of breath cough, syncope,edema  change in exercise tolerance. GI /GU: No adominal pain, vomiting, change in bowel habits. No blood in the stool. No significant GU symptoms. SKIN/HEME: ,no acute skin rashes suspicious lesions or bleeding. No lymphadenopathy, nodules, masses.  NEURO/ PSYCH:  No neurologic signs such as weakness numbness. No depression anxiety. IMM/ Allergy: No unusual infections.  Allergy .   REST of 12 system review negative except as per HPI   Past Medical History:  Diagnosis Date  . High triglycerides   . Hx of varicella     Family History  Problem Relation Age of Onset  . Hypertension Mother   . Hyperlipidemia Mother   . Stroke Mother   . Diabetes Father   . Miscarriages / [place] Brother     Social History   Socioeconomic History  . Marital status: Divorced    Spouse name: Not on file  . Number of children: Not on file  . Years of education: Not on file  . Highest education level: Not on file  Occupational History  . Occupation: Professor    Employer: [employer]  Social  Needs  . Financial resource strain: Not on file  . Food insecurity:    Worry: Not on file    Inability: Not on file  . Transportation needs:    Medical: Not on file    Non-medical: Not on file  Tobacco Use  . Smoking status: Former Smoker    Packs/day: 0.70    Years: 4.00    Pack years: 2.80  . Smokeless tobacco: Never Used  . Tobacco comment: in her 80's   Substance and Sexual Activity  . Alcohol use: Yes    Alcohol/week: 0.5 oz    Types: 1 Standard drinks or equivalent per week    Comment: ocassionally  . Drug use: No  . Sexual activity: Not on file  Lifestyle  . Physical activity:    Days per week: Not on file    Minutes per session: Not on file  . Stress: Not on file  Relationships  . Social  connections:    Talks on phone: Not on file    Gets together: Not on file    Attends religious service: Not on file    Active member of club or organization: Not on file    Attends meetings of clubs or organizations: Not on file    Relationship status: Not on file  Other Topics Concern  . 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    Mother  Moved down to [place]        Divorced one chid and grandchild   Neg ets firearms.    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    Retire this summer 16   Educ [place] leave.  This semester.    Exercises regularly      Neg tad     Outpatient Encounter Medications as of 07/29/2017  Medication Sig  . cholecalciferol (VITAMIN D) 1000 units tablet Take 1,000 Units by mouth daily.  Marland Kitchen loratadine (CLARITIN) 10 MG tablet Take 10 mg by mouth daily.  . MULTIPLE VITAMIN PO Take by mouth.    . Omega-3 Fatty Acids (OMEGA 3 PO) Take by mouth.    . TURMERIC PO Take by mouth.  . cetirizine (ZYRTEC) 10 MG tablet Take 10 mg by mouth daily.  . cycloSPORINE (RESTASIS) 0.05 % ophthalmic emulsion 1 drop 2 (two) times daily. Valetta Close, MD gives this to pt.   No facility-administered encounter medications on file as of 07/29/2017.     EXAM:  BP 118/68 (BP Location: Right Arm, Patient Position: Sitting, Cuff Size: Normal)   Pulse 79   Temp 98.3 F (36.8 C) (Oral)   Ht 5' 4.37" (1.635 m)   Wt 121 lb 11.2 oz (55.2 kg)   BMI 20.65 kg/m   Body mass index is 20.65 kg/m.  Physical Exam: Vital signs reviewed BJY:NWGN is a well-developed well-nourished alert cooperative   who appears stated age in no acute distress.  HEENT: normocephalic atraumatic , Eyes: PERRL EOM's full, conjunctiva clear, Nares: paten,t no deformity discharge or tenderness. Glasses , Ears: no deformity EAC's clear TMs with normal landmarks. Mouth: clear OP, no lesions, edema.  Moist mucous membranes. Dentition in adequate repair. NECK: supple without  masses, thyromegaly or bruits. CHEST/PULM:  Clear to auscultation and percussion breath sounds equal no wheeze , rales or rhonchi. No chest wall deformities or tenderness. CV: PMI is nondisplaced, S1 S2 no gallops, murmurs, rubs.  Peripheral pulses are full without delay.No JVD .  ABDOMEN: Bowel sounds normal nontender  No guard or rebound, no hepato splenomegal no CVA tenderness.  Breast: normal by inspection . No dimpling, discharge, masses, tenderness or discharge . Extremtities:  No clubbing cyanosis or edema, no acute joint swelling or redness no focal atrophy NEURO:  Oriented x3, cranial nerves 3-12 appear to be intact, no obvious focal weakness,gait within normal limits no abnormal reflexes or asymmetrical SKIN: No acute rashes normal turgor, color, no bruising or petechiae. PSYCH: Oriented, good eye contact, no obvious depression anxiety, cognition and judgment appear normal. LN: no cervical axillary inguinal adenopathy No noted deficits in memory, attention, and speech.   Lab Results  Component Value Date   WBC 5.0 07/26/2016   HGB 14.2 07/26/2016   HCT 41.9 07/26/2016   PLT 308.0 07/26/2016   GLUCOSE 93 07/26/2016   CHOL 220 (H) 07/26/2016   TRIG 58.0 07/26/2016   HDL 74.90 07/26/2016   LDLDIRECT 137.3 07/02/2012   LDLCALC 133 (H) 07/26/2016   ALT 18 07/26/2016   AST 20 07/26/2016   NA 140 07/26/2016   K 4.3 07/26/2016   CL 105 07/26/2016   CREATININE 0.78 07/26/2016   BUN 17 07/26/2016   CO2 28 07/26/2016   TSH 2.78 07/26/2016   HGBA1C 5.8 07/26/2016   MICROALBUR 0.7 07/12/2015    ASSESSMENT AND PLAN:  Discussed the following assessment and plan:  Visit for preventive health examination  Medication management - Plan: Basic metabolic panel, CBC with Differential/Platelet, Hemoglobin A1c, Hepatic function panel, Lipid panel, TSH  Hyperlipidemia, mixed - Plan: Basic metabolic panel, CBC with Differential/Platelet, Hemoglobin A1c, Hepatic function panel, Lipid  panel, TSH  Impaired fasting blood sugar - Plan: Basic metabolic panel, CBC with Differential/Platelet, Hemoglobin A1c, Hepatic function panel, Lipid panel, TSH  Family history of diabetes mellitus - Plan: Basic metabolic panel, CBC with Differential/Platelet, Hemoglobin A1c, Hepatic function panel, Lipid panel, TSH  Seasonal allergic rhinitis, unspecified trigger - Plan: Basic metabolic panel, CBC with Differential/Platelet, Hemoglobin A1c, Hepatic function panel, Lipid panel, TSH  Osteopenia, unspecified location Counseled.  preventive and allergy advise  [facility]   Daily and add on antihistamine as needed  Patient Care Team: Palin Tristan, Standley Brooking, MD as PCP - General (Internal Medicine) Crista Luria, MD as Attending Physician (Dermatology) Dyke Maes, OD [facility])  Patient Instructions  Get appt for fasting labs   As planned  Continue lifestyle intervention healthy eating and exercise .  Dialy nasal cortisone preferred and less risk  For control of allergy and use of prn antihistamine. Mammogram yearly if you have denese breast  No longer than every 2 years . Age is biggest risk factor  For breast cancer .   Limiting alcohol also  considier  Repeat dexa  Scan  In next year or so    Health Maintenance, Female Adopting a healthy lifestyle and getting preventive care can go a long way to promote health and wellness. Talk with your health care provider about what schedule of regular examinations is right for you. This is a good chance for you to check in with your provider about disease prevention and staying healthy. In between checkups, there are plenty of things you can do on your own. Experts have done a lot of research about which lifestyle changes and preventive measures are most likely to keep you healthy. Ask your health care provider for more information. Weight and diet Eat a healthy diet  Be sure to include plenty of  vegetables, fruits, low-fat dairy products, and lean  protein.  Do not eat a lot of foods high in solid fats, added sugars, or salt.  Get regular exercise. This is one of the most important things you can do for your health. ? Most adults should exercise for at least 150 minutes each week. The exercise should increase your heart rate and make you sweat (moderate-intensity exercise). ? Most adults should also do strengthening exercises at least twice a week. This is in addition to the moderate-intensity exercise.  Maintain a healthy weight  Body mass index (BMI) is a measurement that can be used to identify possible weight problems. It estimates body fat based on height and weight. Your health care provider can help determine your BMI and help you achieve or maintain a healthy weight.  For females 70 years of age and older: ? A BMI below 18.5 is considered underweight. ? A BMI of 18.5 to 24.9 is normal. ? A BMI of 25 to 29.9 is considered overweight. ? A BMI of 30 and above is considered obese.  Watch levels of cholesterol and blood lipids  You should start having your blood tested for lipids and cholesterol at 71 years of age, then have this test every 5 years.  You may need to have your cholesterol levels checked more often if: ? Your lipid or cholesterol levels are high. ? You are older than 70 years of age. ? You are at high risk for heart disease.  Cancer screening Lung Cancer  Lung cancer screening is recommended for adults 63-66 years old who are at high risk for lung cancer because of a history of smoking.  A yearly low-dose CT scan of the lungs is recommended for people who: ? Currently smoke. ? Have quit within the past 15 years. ? Have at least a 30-pack-year history of smoking. A pack year is smoking an average of one pack of cigarettes a day for 1 year.  Yearly screening should continue until it has been 15 years since you quit.  Yearly screening should stop if you develop a health problem that would prevent you from  having lung cancer treatment.  Breast Cancer  Practice breast self-awareness. This means understanding how your breasts normally appear and feel.  It also means doing regular breast self-exams. Let your health care provider know about any changes, no matter how small.  If you are in your 20s or 30s, you should have a clinical breast exam (CBE) by a health care provider every 1-3 years as part of a regular health exam.  If you are 39 or older, have a CBE every year. Also consider having a breast X-ray (mammogram) every year.  If you have a family history of breast cancer, talk to your health care provider about genetic screening.  If you are at high risk for breast cancer, talk to your health care provider about having an MRI and a mammogram every year.  Breast cancer gene (BRCA) assessment is recommended for women who have family members with BRCA-related cancers. BRCA-related cancers include: ? Breast. ? Ovarian. ? Tubal. ? Peritoneal cancers.  Results of the assessment will determine the need for genetic counseling and BRCA1 and BRCA2 testing.  Cervical Cancer Your health care provider may recommend that you be screened regularly for cancer of the pelvic organs (ovaries, uterus, and vagina). This screening involves a pelvic examination, including checking for microscopic changes to the surface of your cervix (Pap test). You may be encouraged to  have this screening done every 3 years, beginning at age 54.  For women ages 82-65, health care providers may recommend pelvic exams and Pap testing every 3 years, or they may recommend the Pap and pelvic exam, combined with testing for human papilloma virus (HPV), every 5 years. Some types of HPV increase your risk of cervical cancer. Testing for HPV may also be done on women of any age with unclear Pap test results.  Other health care providers may not recommend any screening for nonpregnant women who are considered low risk for pelvic cancer  and who do not have symptoms. Ask your health care provider if a screening pelvic exam is right for you.  If you have had past treatment for cervical cancer or a condition that could lead to cancer, you need Pap tests and screening for cancer for at least 20 years after your treatment. If Pap tests have been discontinued, your risk factors (such as having a new sexual partner) need to be reassessed to determine if screening should resume. Some women have medical problems that increase the chance of getting cervical cancer. In these cases, your health care provider may recommend more frequent screening and Pap tests.  Colorectal Cancer  This type of cancer can be detected and often prevented.  Routine colorectal cancer screening usually begins at 71 years of age and continues through 71 years of age.  Your health care provider may recommend screening at an earlier age if you have risk factors for colon cancer.  Your health care provider may also recommend using home test kits to check for hidden blood in the stool.  A small camera at the end of a tube can be used to examine your colon directly (sigmoidoscopy or colonoscopy). This is done to check for the earliest forms of colorectal cancer.  Routine screening usually begins at age 70.  Direct examination of the colon should be repeated every 5-10 years through 71 years of age. However, you may need to be screened more often if early forms of precancerous polyps or small growths are found.  Skin Cancer  Check your skin from head to toe regularly.  Tell your health care provider about any new moles or changes in moles, especially if there is a change in a mole's shape or color.  Also tell your health care provider if you have a mole that is larger than the size of a pencil eraser.  Always use sunscreen. Apply sunscreen liberally and repeatedly throughout the day.  Protect yourself by wearing long sleeves, pants, a wide-brimmed hat, and  sunglasses whenever you are outside.  Heart disease, diabetes, and high blood pressure  High blood pressure causes heart disease and increases the risk of stroke. High blood pressure is more likely to develop in: ? People who have blood pressure in the high end of the normal range (130-139/85-89 mm Hg). ? People who are overweight or obese. ? People who are African American.  If you are 21-60 years of age, have your blood pressure checked every 3-5 years. If you are 42 years of age or older, have your blood pressure checked every year. You should have your blood pressure measured twice-once when you are at a hospital or clinic, and once when you are not at a hospital or clinic. Record the average of the two measurements. To check your blood pressure when you are not at a hospital or clinic, you can use: ? An automated blood pressure machine at a pharmacy. ?  A home blood pressure monitor.  If you are between 98 years and 60 years old, ask your health care provider if you should take aspirin to prevent strokes.  Have regular diabetes screenings. This involves taking a blood sample to check your fasting blood sugar level. ? If you are at a normal weight and have a low risk for diabetes, have this test once every three years after 71 years of age. ? If you are overweight and have a high risk for diabetes, consider being tested at a younger age or more often. Preventing infection Hepatitis B  If you have a higher risk for hepatitis B, you should be screened for this virus. You are considered at high risk for hepatitis B if: ? You were born in a country where hepatitis B is common. Ask your health care provider which countries are considered high risk. ? Your parents were born in a high-risk country, and you have not been immunized against hepatitis B (hepatitis B vaccine). ? You have HIV or AIDS. ? You use needles to inject street drugs. ? You live with someone who has hepatitis B. ? You have  had sex with someone who has hepatitis B. ? You get hemodialysis treatment. ? You take certain medicines for conditions, including cancer, organ transplantation, and autoimmune conditions.  Hepatitis C  Blood testing is recommended for: ? Everyone born from [birth year] through 1964. ? Anyone with known risk factors for hepatitis C.  Sexually transmitted infections (STIs)  You should be screened for sexually transmitted infections (STIs) including gonorrhea and chlamydia if: ? You are sexually active and are younger than 71 years of age. ? You are older than 71 years of age and your health care provider tells you that you are at risk for this type of infection. ? Your sexual activity has changed since you were last screened and you are at an increased risk for chlamydia or gonorrhea. Ask your health care provider if you are at risk.  If you do not have HIV, but are at risk, it may be recommended that you take a prescription medicine daily to prevent HIV infection. This is called pre-exposure prophylaxis (PrEP). You are considered at risk if: ? You are sexually active and do not regularly use condoms or know the HIV status of your partner(s). ? You take drugs by injection. ? You are sexually active with a partner who has HIV.  Talk with your health care provider about whether you are at high risk of being infected with HIV. If you choose to begin PrEP, you should first be tested for HIV. You should then be tested every 3 months for as long as you are taking PrEP. Pregnancy  If you are premenopausal and you may become pregnant, ask your health care provider about preconception counseling.  If you may become pregnant, take 400 to 800 micrograms (mcg) of folic acid every day.  If you want to prevent pregnancy, talk to your health care provider about birth control (contraception). Osteoporosis and menopause  Osteoporosis is a disease in which the bones lose minerals and strength with aging. This  can result in serious bone fractures. Your risk for osteoporosis can be identified using a bone density scan.  If you are 71 years of age or older, or if you are at risk for osteoporosis and fractures, ask your health care provider if you should be screened.  Ask your health care provider whether you should take a calcium or vitamin D supplement  to lower your risk for osteoporosis.  Menopause may have certain physical symptoms and risks.  Hormone replacement therapy may reduce some of these symptoms and risks. Talk to your health care provider about whether hormone replacement therapy is right for you. Follow these instructions at home:  Schedule regular health, dental, and eye exams.  Stay current with your immunizations.  Do not use any tobacco products including cigarettes, chewing tobacco, or electronic cigarettes.  If you are pregnant, do not drink alcohol.  If you are breastfeeding, limit how much and how often you drink alcohol.  Limit alcohol intake to no more than 1 drink per day for nonpregnant women. One drink equals 12 ounces of beer, 5 ounces of wine, or 1 ounces of hard liquor.  Do not use street drugs.  Do not share needles.  Ask your health care provider for help if you need support or information about quitting drugs.  Tell your health care provider if you often feel depressed.  Tell your health care provider if you have ever been abused or do not feel safe at home. This information is not intended to replace advice given to you by your health care provider. Make sure you discuss any questions you have with your health care provider. Document Released: 10/23/2010 Document Revised: 09/15/2015 Document Reviewed: 01/11/2015 Elsevier Interactive Patient Education  2018 [place]. [place] Noori M.D.

## 2017-07-29 ENCOUNTER — Ambulatory Visit (INDEPENDENT_AMBULATORY_CARE_PROVIDER_SITE_OTHER): Payer: Medicare Other | Admitting: Internal Medicine

## 2017-07-29 ENCOUNTER — Encounter: Payer: Self-pay | Admitting: Internal Medicine

## 2017-07-29 VITALS — BP 118/68 | HR 79 | Temp 98.3°F | Ht 64.37 in | Wt 121.7 lb

## 2017-07-29 DIAGNOSIS — R7301 Impaired fasting glucose: Secondary | ICD-10-CM | POA: Diagnosis not present

## 2017-07-29 DIAGNOSIS — Z833 Family history of diabetes mellitus: Secondary | ICD-10-CM | POA: Diagnosis not present

## 2017-07-29 DIAGNOSIS — J302 Other seasonal allergic rhinitis: Secondary | ICD-10-CM

## 2017-07-29 DIAGNOSIS — M8580 Other specified disorders of bone density and structure, unspecified site: Secondary | ICD-10-CM | POA: Diagnosis not present

## 2017-07-29 DIAGNOSIS — Z0000 Encounter for general adult medical examination without abnormal findings: Secondary | ICD-10-CM

## 2017-07-29 DIAGNOSIS — E782 Mixed hyperlipidemia: Secondary | ICD-10-CM | POA: Diagnosis not present

## 2017-07-29 DIAGNOSIS — Z79899 Other long term (current) drug therapy: Secondary | ICD-10-CM

## 2017-07-29 NOTE — Patient Instructions (Addendum)
Get appt for fasting labs   As planned  Continue lifestyle intervention healthy eating and exercise .  Dialy nasal cortisone preferred and less risk  For control of allergy and use of prn antihistamine. Mammogram yearly if you have denese breast  No longer than every 2 years . Age is biggest risk factor  For breast cancer .   Limiting alcohol also  considier  Repeat dexa  Scan  In next year or so    Health Maintenance, Female Adopting a healthy lifestyle and getting preventive care can go a long way to promote health and wellness. Talk with your health care provider about what schedule of regular examinations is right for you. This is a good chance for you to check in with your provider about disease prevention and staying healthy. In between checkups, there are plenty of things you can do on your own. Experts have done a lot of research about which lifestyle changes and preventive measures are most likely to keep you healthy. Ask your health care provider for more information. Weight and diet Eat a healthy diet  Be sure to include plenty of vegetables, fruits, low-fat dairy products, and lean protein.  Do not eat a lot of foods high in solid fats, added sugars, or salt.  Get regular exercise. This is one of the most important things you can do for your health. ? Most adults should exercise for at least 150 minutes each week. The exercise should increase your heart rate and make you sweat (moderate-intensity exercise). ? Most adults should also do strengthening exercises at least twice a week. This is in addition to the moderate-intensity exercise.  Maintain a healthy weight  Body mass index (BMI) is a measurement that can be used to identify possible weight problems. It estimates body fat based on height and weight. Your health care provider can help determine your BMI and help you achieve or maintain a healthy weight.  For females 71 years of age and older: ? A BMI below 18.5 is  considered underweight. ? A BMI of 18.5 to 24.9 is normal. ? A BMI of 25 to 29.9 is considered overweight. ? A BMI of 30 and above is considered obese.  Watch levels of cholesterol and blood lipids  You should start having your blood tested for lipids and cholesterol at 71 years of age, then have this test every 5 years.  You may need to have your cholesterol levels checked more often if: ? Your lipid or cholesterol levels are high. ? You are older than 71 years of age. ? You are at high risk for heart disease.  Cancer screening Lung Cancer  Lung cancer screening is recommended for adults 65-75 years old who are at high risk for lung cancer because of a history of smoking.  A yearly low-dose CT scan of the lungs is recommended for people who: ? Currently smoke. ? Have quit within the past 15 years. ? Have at least a 30-pack-year history of smoking. A pack year is smoking an average of one pack of cigarettes a day for 1 year.  Yearly screening should continue until it has been 15 years since you quit.  Yearly screening should stop if you develop a health problem that would prevent you from having lung cancer treatment.  Breast Cancer  Practice breast self-awareness. This means understanding how your breasts normally appear and feel.  It also means doing regular breast self-exams. Let your health care provider know about any changes, no  matter how small.  If you are in your 20s or 30s, you should have a clinical breast exam (CBE) by a health care provider every 1-3 years as part of a regular health exam.  If you are 53 or older, have a CBE every year. Also consider having a breast X-ray (mammogram) every year.  If you have a family history of breast cancer, talk to your health care provider about genetic screening.  If you are at high risk for breast cancer, talk to your health care provider about having an MRI and a mammogram every year.  Breast cancer gene (BRCA) assessment  is recommended for women who have family members with BRCA-related cancers. BRCA-related cancers include: ? Breast. ? Ovarian. ? Tubal. ? Peritoneal cancers.  Results of the assessment will determine the need for genetic counseling and BRCA1 and BRCA2 testing.  Cervical Cancer Your health care provider may recommend that you be screened regularly for cancer of the pelvic organs (ovaries, uterus, and vagina). This screening involves a pelvic examination, including checking for microscopic changes to the surface of your cervix (Pap test). You may be encouraged to have this screening done every 3 years, beginning at age 79.  For women ages 62-65, health care providers may recommend pelvic exams and Pap testing every 3 years, or they may recommend the Pap and pelvic exam, combined with testing for human papilloma virus (HPV), every 5 years. Some types of HPV increase your risk of cervical cancer. Testing for HPV may also be done on women of any age with unclear Pap test results.  Other health care providers may not recommend any screening for nonpregnant women who are considered low risk for pelvic cancer and who do not have symptoms. Ask your health care provider if a screening pelvic exam is right for you.  If you have had past treatment for cervical cancer or a condition that could lead to cancer, you need Pap tests and screening for cancer for at least 20 years after your treatment. If Pap tests have been discontinued, your risk factors (such as having a new sexual partner) need to be reassessed to determine if screening should resume. Some women have medical problems that increase the chance of getting cervical cancer. In these cases, your health care provider may recommend more frequent screening and Pap tests.  Colorectal Cancer  This type of cancer can be detected and often prevented.  Routine colorectal cancer screening usually begins at 71 years of age and continues through 71 years of  age.  Your health care provider may recommend screening at an earlier age if you have risk factors for colon cancer.  Your health care provider may also recommend using home test kits to check for hidden blood in the stool.  A small camera at the end of a tube can be used to examine your colon directly (sigmoidoscopy or colonoscopy). This is done to check for the earliest forms of colorectal cancer.  Routine screening usually begins at age 41.  Direct examination of the colon should be repeated every 5-10 years through 71 years of age. However, you may need to be screened more often if early forms of precancerous polyps or small growths are found.  Skin Cancer  Check your skin from head to toe regularly.  Tell your health care provider about any new moles or changes in moles, especially if there is a change in a mole's shape or color.  Also tell your health care provider if you have  a mole that is larger than the size of a pencil eraser.  Always use sunscreen. Apply sunscreen liberally and repeatedly throughout the day.  Protect yourself by wearing long sleeves, pants, a wide-brimmed hat, and sunglasses whenever you are outside.  Heart disease, diabetes, and high blood pressure  High blood pressure causes heart disease and increases the risk of stroke. High blood pressure is more likely to develop in: ? People who have blood pressure in the high end of the normal range (130-139/85-89 mm Hg). ? People who are overweight or obese. ? People who are African American.  If you are 75-2 years of age, have your blood pressure checked every 3-5 years. If you are 10 years of age or older, have your blood pressure checked every year. You should have your blood pressure measured twice-once when you are at a hospital or clinic, and once when you are not at a hospital or clinic. Record the average of the two measurements. To check your blood pressure when you are not at a hospital or clinic, you  can use: ? An automated blood pressure machine at a pharmacy. ? A home blood pressure monitor.  If you are between 24 years and 57 years old, ask your health care provider if you should take aspirin to prevent strokes.  Have regular diabetes screenings. This involves taking a blood sample to check your fasting blood sugar level. ? If you are at a normal weight and have a low risk for diabetes, have this test once every three years after 71 years of age. ? If you are overweight and have a high risk for diabetes, consider being tested at a younger age or more often. Preventing infection Hepatitis B  If you have a higher risk for hepatitis B, you should be screened for this virus. You are considered at high risk for hepatitis B if: ? You were born in a country where hepatitis B is common. Ask your health care provider which countries are considered high risk. ? Your parents were born in a high-risk country, and you have not been immunized against hepatitis B (hepatitis B vaccine). ? You have HIV or AIDS. ? You use needles to inject street drugs. ? You live with someone who has hepatitis B. ? You have had sex with someone who has hepatitis B. ? You get hemodialysis treatment. ? You take certain medicines for conditions, including cancer, organ transplantation, and autoimmune conditions.  Hepatitis C  Blood testing is recommended for: ? Everyone born from [birth year] through 1964. ? Anyone with known risk factors for hepatitis C.  Sexually transmitted infections (STIs)  You should be screened for sexually transmitted infections (STIs) including gonorrhea and chlamydia if: ? You are sexually active and are younger than 71 years of age. ? You are older than 71 years of age and your health care provider tells you that you are at risk for this type of infection. ? Your sexual activity has changed since you were last screened and you are at an increased risk for chlamydia or gonorrhea. Ask your  health care provider if you are at risk.  If you do not have HIV, but are at risk, it may be recommended that you take a prescription medicine daily to prevent HIV infection. This is called pre-exposure prophylaxis (PrEP). You are considered at risk if: ? You are sexually active and do not regularly use condoms or know the HIV status of your partner(s). ? You take drugs by injection. ?  You are sexually active with a partner who has HIV.  Talk with your health care provider about whether you are at high risk of being infected with HIV. If you choose to begin PrEP, you should first be tested for HIV. You should then be tested every 3 months for as long as you are taking PrEP. Pregnancy  If you are premenopausal and you may become pregnant, ask your health care provider about preconception counseling.  If you may become pregnant, take 400 to 800 micrograms (mcg) of folic acid every day.  If you want to prevent pregnancy, talk to your health care provider about birth control (contraception). Osteoporosis and menopause  Osteoporosis is a disease in which the bones lose minerals and strength with aging. This can result in serious bone fractures. Your risk for osteoporosis can be identified using a bone density scan.  If you are 74 years of age or older, or if you are at risk for osteoporosis and fractures, ask your health care provider if you should be screened.  Ask your health care provider whether you should take a calcium or vitamin D supplement to lower your risk for osteoporosis.  Menopause may have certain physical symptoms and risks.  Hormone replacement therapy may reduce some of these symptoms and risks. Talk to your health care provider about whether hormone replacement therapy is right for you. Follow these instructions at home:  Schedule regular health, dental, and eye exams.  Stay current with your immunizations.  Do not use any tobacco products including cigarettes, chewing  tobacco, or electronic cigarettes.  If you are pregnant, do not drink alcohol.  If you are breastfeeding, limit how much and how often you drink alcohol.  Limit alcohol intake to no more than 1 drink per day for nonpregnant women. One drink equals 12 ounces of beer, 5 ounces of wine, or 1 ounces of hard liquor.  Do not use street drugs.  Do not share needles.  Ask your health care provider for help if you need support or information about quitting drugs.  Tell your health care provider if you often feel depressed.  Tell your health care provider if you have ever been abused or do not feel safe at home. This information is not intended to replace advice given to you by your health care provider. Make sure you discuss any questions you have with your health care provider. Document Released: 10/23/2010 Document Revised: 09/15/2015 Document Reviewed: 01/11/2015 Elsevier Interactive Patient Education  [employer].

## 2017-08-01 ENCOUNTER — Other Ambulatory Visit (INDEPENDENT_AMBULATORY_CARE_PROVIDER_SITE_OTHER): Payer: Medicare Other

## 2017-08-01 DIAGNOSIS — Z833 Family history of diabetes mellitus: Secondary | ICD-10-CM | POA: Diagnosis not present

## 2017-08-01 DIAGNOSIS — Z79899 Other long term (current) drug therapy: Secondary | ICD-10-CM | POA: Diagnosis not present

## 2017-08-01 DIAGNOSIS — R7301 Impaired fasting glucose: Secondary | ICD-10-CM

## 2017-08-01 DIAGNOSIS — J302 Other seasonal allergic rhinitis: Secondary | ICD-10-CM | POA: Diagnosis not present

## 2017-08-01 DIAGNOSIS — E782 Mixed hyperlipidemia: Secondary | ICD-10-CM | POA: Diagnosis not present

## 2017-08-01 LAB — HEPATIC FUNCTION PANEL
ALT: 17 U/L (ref 0–35)
AST: 19 U/L (ref 0–37)
Albumin: 4.2 g/dL (ref 3.5–5.2)
Alkaline Phosphatase: 53 U/L (ref 39–117)
BILIRUBIN DIRECT: 0.1 mg/dL (ref 0.0–0.3)
BILIRUBIN TOTAL: 0.6 mg/dL (ref 0.2–1.2)
Total Protein: 6.7 g/dL (ref 6.0–8.3)

## 2017-08-01 LAB — CBC WITH DIFFERENTIAL/PLATELET
BASOS ABS: 0 10*3/uL (ref 0.0–0.1)
Basophils Relative: 1.1 % (ref 0.0–3.0)
Eosinophils Absolute: 0.1 10*3/uL (ref 0.0–0.7)
Eosinophils Relative: 2.9 % (ref 0.0–5.0)
HCT: 38.3 % (ref 36.0–46.0)
Hemoglobin: 13.2 g/dL (ref 12.0–15.0)
LYMPHS ABS: 1.6 10*3/uL (ref 0.7–4.0)
Lymphocytes Relative: 37.8 % (ref 12.0–46.0)
MCHC: 34.5 g/dL (ref 30.0–36.0)
MCV: 87 fl (ref 78.0–100.0)
MONOS PCT: 6.7 % (ref 3.0–12.0)
Monocytes Absolute: 0.3 10*3/uL (ref 0.1–1.0)
NEUTROS ABS: 2.2 10*3/uL (ref 1.4–7.7)
NEUTROS PCT: 51.5 % (ref 43.0–77.0)
PLATELETS: 316 10*3/uL (ref 150.0–400.0)
RBC: 4.4 Mil/uL (ref 3.87–5.11)
RDW: 14.6 % (ref 11.5–15.5)
WBC: 4.3 10*3/uL (ref 4.0–10.5)

## 2017-08-01 LAB — BASIC METABOLIC PANEL
BUN: 19 mg/dL (ref 6–23)
CALCIUM: 9.3 mg/dL (ref 8.4–10.5)
CHLORIDE: 104 meq/L (ref 96–112)
CO2: 26 mEq/L (ref 19–32)
CREATININE: 0.77 mg/dL (ref 0.40–1.20)
GFR: 78.63 mL/min (ref >60.00)
Glucose, Bld: 98 mg/dL (ref 70–99)
Potassium: 4.5 mEq/L (ref 3.5–5.1)
Sodium: 138 mEq/L (ref 135–145)

## 2017-08-01 LAB — LIPID PANEL
Cholesterol: 191 mg/dL (ref 0–200)
HDL: 66 mg/dL (ref >39.00)
LDL Cholesterol: 110 mg/dL — ABNORMAL HIGH (ref 0–99)
NONHDL: 124.64
Total CHOL/HDL Ratio: 3
Triglycerides: 72 mg/dL (ref 0.0–149.0)
VLDL: 14.4 mg/dL (ref 0.0–40.0)

## 2017-08-01 LAB — HEMOGLOBIN A1C: Hgb A1c MFr Bld: 5.7 % (ref 4.6–6.5)

## 2017-08-01 LAB — TSH: TSH: 2.58 u[IU]/mL (ref 0.35–4.50)

## 2017-08-07 NOTE — Progress Notes (Signed)
[name] [address] [place], [place] [address] Phone: [phone] Subjective:     CC: Neck and back pain follow-up  Tina [name]  Tina [name] is a 72 y.o. female coming in with complaint of neck and back pain follow-up.  Patient was found to have some flare last time we saw patient.  Patient had had the flu and was having significant amount of aches and pains.  Patient feels like making some progress at this time.  Very slowly though.     Past Medical History:  Diagnosis Date  . High triglycerides   . Hx of varicella    Past Surgical History:  Procedure Laterality Date  . CHOLECYSTECTOMY     Social History   Socioeconomic History  . Marital status: Divorced    Spouse name: Not on file  . Number of children: Not on file  . Years of education: Not on file  . Highest education level: Not on file  Occupational History  . Occupation: Professor    Employer: [employer]  Social Needs  . Financial resource strain: Not on file  . Food insecurity:    Worry: Not on file    Inability: Not on file  . Transportation needs:    Medical: Not on file    Non-medical: Not on file  Tobacco Use  . Smoking status: Former Smoker    Packs/day: 0.70    Years: 4.00    Pack years: 2.80  . Smokeless tobacco: Never Used  . Tobacco comment: in her 52's   Substance and Sexual Activity  . Alcohol use: Yes    Alcohol/week: 0.5 oz    Types: 1 Standard drinks or equivalent per week    Comment: ocassionally  . Drug use: No  . Sexual activity: Not on file  Lifestyle  . Physical activity:    Days per week: Not on file    Minutes per session: Not on file  . Stress: Not on file  Relationships  . Social connections:    Talks on phone: Not on file    Gets together: Not on file    Attends religious service: Not on file    Active member of club or organization: Not on file    Attends meetings of clubs or organizations: Not on file    Relationship status: Not on  file  Other Topics Concern  . 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    Mother  Moved down to friends home        Divorced one chid and grandchild   Neg ets firearms.    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    Exercises regularly      Neg tad    No Known Allergies Family History  Problem Relation Age of Onset  . Hypertension Mother   . Hyperlipidemia Mother   . Stroke Mother   . Diabetes Father   . Miscarriages / [place] Brother      Past medical history, social, surgical and family history all reviewed in electronic medical record.  No pertanent information unless stated regarding to the chief complaint.   Review of Systems:Review of systems updated and as accurate as of 08/08/17  No headache, visual changes, nausea, vomiting, diarrhea, constipation, dizziness, abdominal pain, skin  rash, fevers, chills, night sweats, weight loss, swollen lymph nodes, body aches, joint swelling, muscle aches, chest pain, shortness of breath, mood changes.   Objective  Blood pressure 116/78, pulse 74, height 5' 4.37" (1.635 m), weight 123 lb (55.8 kg), SpO2 98 %. Systems examined below as of 08/08/17   General: No apparent distress alert and oriented x3 mood and affect normal, dressed appropriately.  HEENT: Pupils equal, extraocular movements intact  Respiratory: Patient's speak in full sentences and does not appear short of breath  Cardiovascular: No lower extremity edema, non tender, no erythema  Skin: Warm dry intact with no signs of infection or rash on extremities or on axial skeleton.  Abdomen: Soft nontender  Neuro: Cranial nerves II through XII are intact, neurovascularly intact in all extremities with 2+ DTRs and 2+ pulses.  Lymph: No lymphadenopathy of posterior or anterior cervical chain or axillae bilaterally.  Gait normal with good balance and coordination.  MSK:  Non  tender with full range of motion and good stability and symmetric strength and tone of shoulders, elbows, wrist, hip, knee and ankles bilaterally.  Mild arthritic changes of multiple joints  Neck exam does show some mild limited range of motion in all planes.  Patient does have some crepitus.  Negative Spurling's test.  Full strength in the upper extremities that is symmetric  Back exam shows some tightness in the thoracolumbar and lumbosacral junction.  Mild positive Pearlean BrownieFaber on the right side.  Osteopathic findings C2 flexed rotated and side bent right C4 flexed rotated and side bent left C7 flexed rotated and side bent left T3 extended rotated and side bent right inhaled third rib T6 extended rotated and side bent left L3 flexed rotated and side bent right Sacrum right on right     Impression and Recommendations:     This case required medical decision making of moderate complexity.      Note: This dictation was prepared with Dragon dictation along with smaller phrase technology. Any transcriptional errors that result from this process are unintentional.

## 2017-08-08 ENCOUNTER — Encounter: Payer: Self-pay | Admitting: Family Medicine

## 2017-08-08 ENCOUNTER — Ambulatory Visit: Payer: Medicare Other | Admitting: Family Medicine

## 2017-08-08 VITALS — BP 116/78 | HR 74 | Ht 64.37 in | Wt 123.0 lb

## 2017-08-08 DIAGNOSIS — M999 Biomechanical lesion, unspecified: Secondary | ICD-10-CM

## 2017-08-08 DIAGNOSIS — M542 Cervicalgia: Secondary | ICD-10-CM | POA: Diagnosis not present

## 2017-08-08 NOTE — Patient Instructions (Signed)
Good to see you  We will call you when the orthotics are in  Ice is your friend.  Stay active and I want you to hike.  See me again in 4-5 weeks

## 2017-08-08 NOTE — Assessment & Plan Note (Signed)
Stable.  Discussed icing regimen and home exercises.  Which activities to do. Discussed posture.  Discussed avoid overhead lifting.  Patient will return to see me again in 4-5 weeks.

## 2017-08-08 NOTE — Assessment & Plan Note (Signed)
Decision today to treat with OMT was based on Physical Exam  After verbal consent patient was treated with HVLA, ME, FPR techniques in cervical, thoracic, rib, lumbar and sacral areas  Patient tolerated the procedure well with improvement in symptoms  Patient given exercises, stretches and lifestyle modifications  See medications in patient instructions if given  Patient will follow up in 4-6 weeks 

## 2017-08-26 ENCOUNTER — Ambulatory Visit (INDEPENDENT_AMBULATORY_CARE_PROVIDER_SITE_OTHER): Payer: Medicare Other | Admitting: Family Medicine

## 2017-08-26 VITALS — BP 116/78

## 2017-08-26 DIAGNOSIS — M7741 Metatarsalgia, right foot: Secondary | ICD-10-CM | POA: Diagnosis not present

## 2017-08-26 DIAGNOSIS — M7742 Metatarsalgia, left foot: Secondary | ICD-10-CM | POA: Diagnosis not present

## 2017-08-26 NOTE — Progress Notes (Signed)
Left Transverse 250/35 Right transverse 250/35 medial long. Arch 270/90, 300/110

## 2017-08-27 LAB — HM MAMMOGRAPHY

## 2017-08-28 NOTE — Progress Notes (Signed)
Discussed with patient about use of orthotic and can make adjustments in the next two coming weeks. Provided supportive care recommendations.   Myra Rude, MD [facility] 08/28/2017, 11:14 AM

## 2017-09-09 NOTE — Progress Notes (Signed)
[name] [address] [place], [place] [address] Phone: [phone] Subjective:      CC: Back pain follow-up  [id number]  Tina [name] is a 72 y.o. female coming in with complaint of back and neck pain. She states that she has been having for 1 week on the right side at the base of her skull. Did have some shooting pain into the ear. She has been using ice and tennis ball trigger point releases which alleviates the pain.  Patient feels like she has been doing relatively well but the guarding seems to cause some mild exacerbation.  Nothing with any radiation of pain and no pain that stops her from activities.     Past Medical History:  Diagnosis Date  . High triglycerides   . Hx of varicella    Past Surgical History:  Procedure Laterality Date  . CHOLECYSTECTOMY     Social History   Socioeconomic History  . Marital status: Divorced    Spouse name: Not on file  . Number of children: Not on file  . Years of education: Not on file  . Highest education level: Not on file  Occupational History  . Occupation: Professor    Employer: [employer]  Social Needs  . Financial resource strain: Not on file  . Food insecurity:    Worry: Not on file    Inability: Not on file  . Transportation needs:    Medical: Not on file    Non-medical: Not on file  Tobacco Use  . Smoking status: Former Smoker    Packs/day: 0.70    Years: 4.00    Pack years: 2.80  . Smokeless tobacco: Never Used  . Tobacco comment: in her 57's   Substance and Sexual Activity  . Alcohol use: Yes    Alcohol/week: 0.5 oz    Types: 1 Standard drinks or equivalent per week    Comment: ocassionally  . Drug use: No  . Sexual activity: Not on file  Lifestyle  . Physical activity:    Days per week: Not on file    Minutes per session: Not on file  . Stress: Not on file  Relationships  . Social connections:    Talks on phone: Not on file    Gets together: Not on file    Attends  religious service: Not on file    Active member of club or organization: Not on file    Attends meetings of clubs or organizations: Not on file    Relationship status: Not on file  Other Topics Concern  . 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    Mother  Moved down to friends home        Divorced one chid and grandchild   Neg ets firearms.    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    Exercises regularly      Neg tad    No Known Allergies Family History  Problem Relation Age of Onset  . Hypertension Mother   . Hyperlipidemia Mother   . Stroke Mother   . Diabetes Father   . Miscarriages / [place] Brother      Past medical history, social, surgical and family history all reviewed in electronic medical record.  No pertanent information unless  stated regarding to the chief complaint.   Review of Systems:Review of systems updated and as accurate as of 09/10/17  No headache, visual changes, nausea, vomiting, diarrhea, constipation, dizziness, abdominal pain, skin rash, fevers, chills, night sweats, weight loss, swollen lymph nodes, body aches, joint swelling, chest pain, shortness of breath, mood changes.  Positive muscle aches  Objective  Blood pressure 110/78, pulse 72, height  (1.626 m), weight 123 lb (55.8 kg), SpO2 98 %. Systems examined below as of 09/10/17   General: No apparent distress alert and oriented x3 mood and affect normal, dressed appropriately.  HEENT: Pupils equal, extraocular movements intact  Respiratory: Patient's speak in full sentences and does not appear short of breath  Cardiovascular: No lower extremity edema, non tender, no erythema  Skin: Warm dry intact with no signs of infection or rash on extremities or on axial skeleton.  Abdomen: Soft nontender  Neuro: Cranial nerves II through XII are intact, neurovascularly intact in all extremities  with 2+ DTRs and 2+ pulses.  Lymph: No lymphadenopathy of posterior or anterior cervical chain or axillae bilaterally.  Gait normal with good balance and coordination.  MSK:  Non tender with full range of motion and good stability and symmetric strength and tone of shoulders, elbows, wrist, hip, knee and ankles bilaterally.  Back Exam:  Inspection: Mild loss of lordosis Motion: Flexion 45 deg, Extension 30 deg, Side Bending to 45 deg bilaterally,  Rotation to 45 deg bilaterally  SLR laying: Negative  XSLR laying: Negative  Palpable tenderness: Tender to palpation the paraspinal musculature lumbar spine more in the cervical thoracic as well as the lumbosacral. FABER: negative. Sensory change: Gross sensation intact to all lumbar and sacral dermatomes.  Reflexes: 2+ at both patellar tendons, 2+ at achilles tendons, Babinski's downgoing.  Strength at foot  Plantar-flexion: 5/5 Dorsi-flexion: 5/5 Eversion: 5/5 Inversion: 5/5  Leg strength  Quad: 5/5 Hamstring: 5/5 Hip flexor: 5/5 Hip abductors: 5/5  Gait unremarkable. Neck: Inspection loss of lordosis. No palpable stepoffs. Negative Spurling's maneuver.  Mild limitation of 5 degrees in all planes except for flexion Grip strength and sensation normal in bilateral hands Strength good C4 to T1 distribution No sensory change to C4 to T1 Negative Hoffman sign bilaterally Reflexes normal  Osteopathic findings C2 flexed rotated and side bent right C6 flexed rotated and side bent left T3 extended rotated and side bent right inhaled third rib T6 extended rotated and side bent left L2 flexed rotated and side bent right Sacrum right on right    Impression and Recommendations:     This case required medical decision making of moderate complexity.      Note: This dictation was prepared with Dragon dictation along with smaller phrase technology. Any transcriptional errors that result from this process are unintentional.

## 2017-09-10 ENCOUNTER — Ambulatory Visit: Payer: Medicare Other | Admitting: Family Medicine

## 2017-09-10 ENCOUNTER — Encounter: Payer: Self-pay | Admitting: Family Medicine

## 2017-09-10 VITALS — BP 110/78 | HR 72 | Ht 64.0 in | Wt 123.0 lb

## 2017-09-10 DIAGNOSIS — M9984 Other biomechanical lesions of sacral region: Secondary | ICD-10-CM | POA: Diagnosis not present

## 2017-09-10 DIAGNOSIS — M9982 Other biomechanical lesions of thoracic region: Secondary | ICD-10-CM | POA: Diagnosis not present

## 2017-09-10 DIAGNOSIS — M542 Cervicalgia: Secondary | ICD-10-CM

## 2017-09-10 DIAGNOSIS — M9983 Other biomechanical lesions of lumbar region: Secondary | ICD-10-CM

## 2017-09-10 DIAGNOSIS — M9981 Other biomechanical lesions of cervical region: Secondary | ICD-10-CM | POA: Diagnosis not present

## 2017-09-10 DIAGNOSIS — M999 Biomechanical lesion, unspecified: Secondary | ICD-10-CM | POA: Diagnosis not present

## 2017-09-10 DIAGNOSIS — M9988 Other biomechanical lesions of rib cage: Secondary | ICD-10-CM

## 2017-09-10 NOTE — Assessment & Plan Note (Signed)
Stable overall.  Noted to have some mild arthritic changes and ergonomics symptoms likely contributing.  We discussed which activities to do which wants to avoid.  Patient is to increase activities in which wants to avoid.  Patient will follow-up with me again in 6 weeks.

## 2017-09-10 NOTE — Patient Instructions (Addendum)
Good to see you  Ice is your friend Overall you should do well  See me again in 5-6 weeks

## 2017-09-10 NOTE — Assessment & Plan Note (Addendum)
Decision today to treat with OMT was based on Physical Exam  After verbal consent patient was treated with HVLA, ME, FPR techniques in cervical, thoracic, rib lumbar and sacral areas  Patient tolerated the procedure well with improvement in symptoms  Patient given exercises, stretches and lifestyle modifications  See medications in patient instructions if given  Patient will follow up in 6 weeks 

## 2017-09-18 ENCOUNTER — Encounter: Payer: Self-pay | Admitting: Internal Medicine

## 2017-10-14 NOTE — Progress Notes (Signed)
[name] [address] [place], [place] [address] Phone: [phone] Subjective:    I'm seeing this patient by the request  of:    CC: Neck and back pain  YQM:[id number]  Tina [name] is a 72 y.o. female coming in with complaint of neck and back pain.  Patient has had this for quite some time.  Working on [employer] cabin crew.  Has been 5 weeks since last manipulation.  Doing relatively well with some mild tightness but nothing severe.  Not stopping her from any activities.  Even travel to [place] without any significant difficulty.     Past Medical History:  Diagnosis Date  . High triglycerides   . Hx of varicella    Past Surgical History:  Procedure Laterality Date  . CHOLECYSTECTOMY     Social History   Socioeconomic History  . Marital status: Divorced    Spouse name: Not on file  . Number of children: Not on file  . Years of education: Not on file  . Highest education level: Not on file  Occupational History  . Occupation: Professor    Employer: [employer]  Social Needs  . Financial resource strain: Not on file  . Food insecurity:    Worry: Not on file    Inability: Not on file  . Transportation needs:    Medical: Not on file    Non-medical: Not on file  Tobacco Use  . Smoking status: Former Smoker    Packs/day: 0.70    Years: 4.00    Pack years: 2.80  . Smokeless tobacco: Never Used  . Tobacco comment: in her 40's   Substance and Sexual Activity  . Alcohol use: Yes    Alcohol/week: 0.6 oz    Types: 1 Standard drinks or equivalent per week    Comment: ocassionally  . Drug use: No  . Sexual activity: Not on file  Lifestyle  . Physical activity:    Days per week: Not on file    Minutes per session: Not on file  . Stress: Not on file  Relationships  . Social connections:    Talks on phone: Not on file    Gets together: Not on file    Attends religious service: Not on file    Active member of club or organization: Not  on file    Attends meetings of clubs or organizations: Not on file    Relationship status: Not on file  Other Topics Concern  . 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    Mother  Moved down to friends home        Divorced one chid and grandchild   Neg ets firearms.    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    Exercises regularly      Neg tad    No Known Allergies Family History  Problem Relation Age of Onset  . Hypertension Mother   . Hyperlipidemia Mother   . Stroke Mother   . Diabetes Father   . Miscarriages / [place] Brother      Past medical history, social, surgical and family history all reviewed in electronic medical record.  No pertanent information unless stated regarding to the chief complaint.   Review of Systems:Review of systems updated and  as accurate as of 10/15/17  No headache, visual changes, nausea, vomiting, diarrhea, constipation, dizziness, abdominal pain, skin rash, fevers, chills, night sweats, weight loss, swollen lymph nodes, body aches, joint swelling, chest pain, shortness of breath, mood changes.  Positive muscle aches  Objective  Blood pressure 120/76, pulse 78, height 5\' 4"  (1.626 m), weight 123 lb (55.8 kg), SpO2 98 %. Systems examined below as of 10/15/17   General: No apparent distress alert and oriented x3 mood and affect normal, dressed appropriately.  HEENT: Pupils equal, extraocular movements intact  Respiratory: Patient's speak in full sentences and does not appear short of breath  Cardiovascular: No lower extremity edema, non tender, no erythema  Skin: Warm dry intact with no signs of infection or rash on extremities or on axial skeleton.  Abdomen: Soft nontender  Neuro: Cranial nerves II through XII are intact, neurovascularly intact in all extremities with 2+ DTRs and 2+ pulses.  Lymph: No lymphadenopathy of posterior or  anterior cervical chain or axillae bilaterally.  Gait normal with good balance and coordination.  MSK:  Non tender with full range of motion and good stability and symmetric strength and tone of shoulders, elbows, wrist, hip, knee and ankles bilaterally.  Neck: Inspection loss of lordosis. No palpable stepoffs. Negative Spurling's maneuver. Limited side bending bilaterally Grip strength and sensation normal in bilateral hands Strength good C4 to T1 distribution No sensory change to C4 to T1 Negative Hoffman sign bilaterally Reflexes normal Right trapezius muscle tightness  Osteopathic findings C2 flexed rotated and side bent left C6 flexed rotated and side bent right  T3 extended rotated and side bent right inhaled third rib T7 extended rotated and side bent left      Impression and Recommendations:     This case required medical decision making of moderate complexity.      Note: This dictation was prepared with Dragon dictation along with smaller phrase technology. Any transcriptional errors that result from this process are unintentional.

## 2017-10-15 ENCOUNTER — Ambulatory Visit: Payer: Medicare Other | Admitting: Family Medicine

## 2017-10-15 ENCOUNTER — Encounter: Payer: Self-pay | Admitting: Family Medicine

## 2017-10-15 VITALS — BP 120/76 | HR 78 | Ht 64.0 in | Wt 123.0 lb

## 2017-10-15 DIAGNOSIS — M9901 Segmental and somatic dysfunction of cervical region: Secondary | ICD-10-CM | POA: Diagnosis not present

## 2017-10-15 DIAGNOSIS — M9902 Segmental and somatic dysfunction of thoracic region: Secondary | ICD-10-CM | POA: Diagnosis not present

## 2017-10-15 DIAGNOSIS — M9981 Other biomechanical lesions of cervical region: Secondary | ICD-10-CM | POA: Diagnosis not present

## 2017-10-15 DIAGNOSIS — M542 Cervicalgia: Secondary | ICD-10-CM | POA: Diagnosis not present

## 2017-10-15 DIAGNOSIS — M9908 Segmental and somatic dysfunction of rib cage: Secondary | ICD-10-CM

## 2017-10-15 DIAGNOSIS — M999 Biomechanical lesion, unspecified: Secondary | ICD-10-CM

## 2017-10-15 NOTE — Assessment & Plan Note (Signed)
Decision today to treat with OMT was based on Physical Exam  After verbal consent patient was treated with HVLA, ME, FPR techniques in cervical, thoracic, rib areas  Patient tolerated the procedure well with improvement in symptoms  Patient given exercises, stretches and lifestyle modifications  See medications in patient instructions if given  Patient will follow up in 4 weeks 

## 2017-10-15 NOTE — Assessment & Plan Note (Signed)
Stable.  Patient is doing relatively well.  Discussed ergonomics again as well as posture.  Discussed proper lifting mechanics.  Follow-up again in 6 weeks

## 2017-10-15 NOTE — Patient Instructions (Signed)
Good to see you  6 weeks would be good.  Enjoy the pool

## 2017-11-25 NOTE — Progress Notes (Signed)
[facility] Scale[facility] Smith D.O[facility] [address] [place], [place] [address] Phone: [phone] Subjective:    I'm seeing this patient by the request  of:    CC: Neck pain follow-up  ION:[id number]:Subjective  Tina [name] is a 72 y.o. female coming in with complaint of neck pain. No changes since last visit. Patient is very active. She walks in her neighborhood and take yoga and strength classes throughout the week. Patient is here for OMT which has been effective in managing her neck pain.  To go traveling to [place] and was doing scuba diving.  Had a vertigo episode but did not seem to have any worsening neck pain.  Only some mild discomfort.  No radiation of the pain.      Past Medical History:  Diagnosis Date  . High triglycerides   . Hx of varicella    Past Surgical History:  Procedure Laterality Date  . CHOLECYSTECTOMY     Social History   Socioeconomic History  . Marital status: Divorced    Spouse name: Not on file  . Number of children: Not on file  . Years of education: Not on file  . Highest education level: Not on file  Occupational History  . Occupation: Professor    Employer: [employer]  Social Needs  . Financial resource strain: Not on file  . Food insecurity:    Worry: Not on file    Inability: Not on file  . Transportation needs:    Medical: Not on file    Non-medical: Not on file  Tobacco Use  . Smoking status: Former Smoker    Packs/day: 0.70    Years: 4.00    Pack years: 2.80  . Smokeless tobacco: Never Used  . Tobacco comment: in her 320's   Substance and Sexual Activity  . Alcohol use: Yes    Alcohol/week: 0.6 oz    Types: 1 Standard drinks or equivalent per week    Comment: ocassionally  . Drug use: No  . Sexual activity: Not on file  Lifestyle  . Physical activity:    Days per week: Not on file    Minutes per session: Not on file  . Stress: Not on file  Relationships  . Social connections:    Talks on phone: Not on file    Gets  together: Not on file    Attends religious service: Not on file    Active member of club or organization: Not on file    Attends meetings of clubs or organizations: Not on file    Relationship status: Not on file  Other Topics Concern  . 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    Mother  Moved down to friends home        Divorced one chid and grandchild   Neg ets firearms.    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    Exercises regularly      Neg tad    No Known Allergies Family History  Problem Relation Age of Onset  . Hypertension Mother   . Hyperlipidemia Mother   . Stroke Mother   . Diabetes Father   . Miscarriages / [place] Brother      Past medical history, social, surgical and family history all reviewed in electronic  medical record.  No pertanent information unless stated regarding to the chief complaint.   Review of Systems:Review of systems updated and as accurate as of 11/26/17  No headache, visual changes, nausea, vomiting, diarrhea, constipation, dizziness, abdominal pain, skin rash, fevers, chills, night sweats, weight loss, swollen lymph nodes, body aches, joint swelling, muscle aches, chest pain, shortness of breath, mood changes.   Objective  Blood pressure 124/82, pulse 71, height 5\' 4"  (1.626 m), weight 123 lb (55.8 kg), SpO2 98 %. Systems examined below as of 11/26/17   General: No apparent distress alert and oriented x3 mood and affect normal, dressed appropriately.  HEENT: Pupils equal, extraocular movements intact  Respiratory: Patient's speak in full sentences and does not appear short of breath  Cardiovascular: No lower extremity edema, non tender, no erythema  Skin: Warm dry intact with no signs of infection or rash on extremities or on axial skeleton.  Abdomen: Soft nontender  Neuro: Cranial nerves II through XII are intact, neurovascularly  intact in all extremities with 2+ DTRs and 2+ pulses.  Lymph: No lymphadenopathy of posterior or anterior cervical chain or axillae bilaterally.  Gait normal with good balance and coordination.  MSK:  Non tender with full range of motion and good stability and symmetric strength and tone of shoulders, elbows, wrist, hip, knee and ankles bilaterally.  Mild arthritic changes Neck: Inspection mild to moderate loss of lordosis. No palpable stepoffs. Negative Spurling's maneuver. Loss of lordosis with sidebending lacking last 5 degrees of sidebending Grip strength and sensation normal in bilateral hands Strength good C4 to T1 distribution No sensory change to C4 to T1 Negative Hoffman sign bilaterally Reflexes normal Tightness of the right trapezius   Osteopathic findings C2 flexed rotated and side bent right C4 flexed rotated and side bent left C7 flexed rotated and side bent left T3 extended rotated and side bent right inhaled third rib T6 extended rotated and side bent left L3 flexed rotated and side bent right Sacrum right on right     Impression and Recommendations:     This case required medical decision making of moderate complexity.      Note: This dictation was prepared with Dragon dictation along with smaller phrase technology. Any transcriptional errors that result from this process are unintentional.

## 2017-11-26 ENCOUNTER — Encounter: Payer: Self-pay | Admitting: Family Medicine

## 2017-11-26 ENCOUNTER — Ambulatory Visit: Payer: Medicare Other | Admitting: Family Medicine

## 2017-11-26 VITALS — BP 124/82 | HR 71 | Ht 64.0 in | Wt 123.0 lb

## 2017-11-26 DIAGNOSIS — M542 Cervicalgia: Secondary | ICD-10-CM | POA: Diagnosis not present

## 2017-11-26 DIAGNOSIS — M9981 Other biomechanical lesions of cervical region: Secondary | ICD-10-CM

## 2017-11-26 DIAGNOSIS — M999 Biomechanical lesion, unspecified: Secondary | ICD-10-CM | POA: Diagnosis not present

## 2017-11-26 NOTE — Assessment & Plan Note (Signed)
Decision today to treat with OMT was based on Physical Exam  After verbal consent patient was treated with HVLA, ME, FPR techniques in cervical, thoracic, rib, lumbar and sacral areas  Patient tolerated the procedure well with improvement in symptoms  Patient given exercises, stretches and lifestyle modifications  See medications in patient instructions if given  Patient will follow up in 7-8 weeks 

## 2017-11-26 NOTE — Assessment & Plan Note (Signed)
Today more tenderness actually on the right side.  We discussed continuing the home exercises.  Doing well with the vitamin supplementation.  Patient is working out most days a week.  Discussed with patient at great length about different ergonomics throughout the day.  Follow-up with me again in 7 to 8 weeks

## 2017-11-26 NOTE — Patient Instructions (Signed)
Good to see you  I am impressed Keep it up  Stay active See me again in 7 weeks

## 2018-01-12 NOTE — Progress Notes (Signed)
[name] [address] [place], [place] [address] Phone: [phone] Subjective:   [name], am serving as a scribe for Dr. Antoine Primas.   CC: Back and neck pain  GEX:[id number]  Tina [name] is a 72 y.o. female coming in with complaint of neck pain. She was having some issues a few weeks ago as she has been traveling but states that she is doing fine today. Is here for OMT. Has had relief of her pain with this technique. Patient feels like overall doing well.  Some mild tightness.      Past Medical History:  Diagnosis Date  . High triglycerides   . Hx of varicella    Past Surgical History:  Procedure Laterality Date  . CHOLECYSTECTOMY     Social History   Socioeconomic History  . Marital status: Divorced    Spouse name: Not on file  . Number of children: Not on file  . Years of education: Not on file  . Highest education level: Not on file  Occupational History  . Occupation: Professor    Employer: [employer]  Social Needs  . Financial resource strain: Not on file  . Food insecurity:    Worry: Not on file    Inability: Not on file  . Transportation needs:    Medical: Not on file    Non-medical: Not on file  Tobacco Use  . Smoking status: Former Smoker    Packs/day: 0.70    Years: 4.00    Pack years: 2.80  . Smokeless tobacco: Never Used  . Tobacco comment: in her 17's   Substance and Sexual Activity  . Alcohol use: Yes    Alcohol/week: 1.0 standard drinks    Types: 1 Standard drinks or equivalent per week    Comment: ocassionally  . Drug use: No  . Sexual activity: Not on file  Lifestyle  . Physical activity:    Days per week: Not on file    Minutes per session: Not on file  . Stress: Not on file  Relationships  . Social connections:    Talks on phone: Not on file    Gets together: Not on file    Attends religious service: Not on file    Active member of club or organization: Not on file    Attends  meetings of clubs or organizations: Not on file    Relationship status: Not on file  Other Topics Concern  . 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    Mother  Moved down to friends home        Divorced one chid and grandchild   Neg ets firearms.    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    Exercises regularly      Neg tad    No Known Allergies Family History  Problem Relation Age of Onset  . Hypertension Mother   . Hyperlipidemia Mother   . Stroke Mother   . Diabetes Father   . Miscarriages / [place] Brother       Current Outpatient Medications (Respiratory):  .  cetirizine (ZYRTEC) 10 MG tablet, Take 10 mg by mouth daily. Marland Kitchen  loratadine (CLARITIN) 10 MG tablet, Take 10 mg by mouth daily.    Current Outpatient Medications (  Other):  .  cholecalciferol (VITAMIN D) 1000 units tablet, Take 1,000 Units by mouth daily. .  cycloSPORINE (RESTASIS) 0.05 % ophthalmic emulsion, 1 drop 2 (two) times daily. Hebert Sohoharles Burnes, MD gives this to pt. .  MULTIPLE VITAMIN PO, Take by mouth.   .  Omega-3 Fatty Acids (OMEGA 3 PO), Take by mouth.   .  TURMERIC PO, Take by mouth.    Past medical history, social, surgical and family history all reviewed in electronic medical record.  No pertanent information unless stated regarding to the chief complaint.   Review of Systems:  No headache, visual changes, nausea, vomiting, diarrhea, constipation, dizziness, abdominal pain, skin rash, fevers, chills, night sweats, weight loss, swollen lymph nodes, body aches, joint swelling,  chest pain, shortness of breath, mood changes.  Positive muscle aches  Objective  Blood pressure 118/82, pulse 78, height 5\' 4"  (1.626 m), weight 124 lb (56.2 kg), SpO2 99 %.    General: No apparent distress alert and oriented x3 mood and affect normal, dressed appropriately.  HEENT: Pupils equal, extraocular  movements intact  Respiratory: Patient's speak in full sentences and does not appear short of breath  Cardiovascular: No lower extremity edema, non tender, no erythema  Skin: Warm dry intact with no signs of infection or rash on extremities or on axial skeleton.  Abdomen: Soft nontender  Neuro: Cranial nerves II through XII are intact, neurovascularly intact in all extremities with 2+ DTRs and 2+ pulses.  Lymph: No lymphadenopathy of posterior or anterior cervical chain or axillae bilaterally.  Gait normal with good balance and coordination.  MSK:  Non tender with full range of motion and good stability and symmetric strength and tone of shoulders, elbows, wrist, hip, knee and ankles bilaterally.  Mild arthritic changes Neck: Inspection loss of lordosis. No palpable stepoffs. Negative Spurling's maneuver. Mild loss of sidebending and rotation bilaterally Grip strength and sensation normal in bilateral hands Strength good C4 to T1 distribution No sensory change to C4 to T1 Negative Hoffman sign bilaterally Reflexes normal Tightness of the trapezius.  Osteopathic findings C4 flexed rotated and side bent left T3 extended rotated and side bent right inhaled third rib T6 extended rotated and side bent left L3 flexed rotated and side bent right Sacrum right on right    Impression and Recommendations:     This case required medical decision making of moderate complexity. The above documentation has been reviewed and is accurate and complete Judi SaaZachary M Smith, DO       Note: This dictation was prepared with Dragon dictation along with smaller phrase technology. Any transcriptional errors that result from this process are unintentional.

## 2018-01-14 ENCOUNTER — Encounter: Payer: Self-pay | Admitting: Family Medicine

## 2018-01-14 ENCOUNTER — Ambulatory Visit: Payer: Medicare Other | Admitting: Family Medicine

## 2018-01-14 VITALS — BP 118/82 | HR 78 | Ht 64.0 in | Wt 124.0 lb

## 2018-01-14 DIAGNOSIS — M999 Biomechanical lesion, unspecified: Secondary | ICD-10-CM | POA: Diagnosis not present

## 2018-01-14 DIAGNOSIS — M542 Cervicalgia: Secondary | ICD-10-CM

## 2018-01-14 DIAGNOSIS — M9901 Segmental and somatic dysfunction of cervical region: Secondary | ICD-10-CM | POA: Diagnosis not present

## 2018-01-14 DIAGNOSIS — M9904 Segmental and somatic dysfunction of sacral region: Secondary | ICD-10-CM | POA: Diagnosis not present

## 2018-01-14 DIAGNOSIS — M9902 Segmental and somatic dysfunction of thoracic region: Secondary | ICD-10-CM | POA: Diagnosis not present

## 2018-01-14 DIAGNOSIS — M9908 Segmental and somatic dysfunction of rib cage: Secondary | ICD-10-CM | POA: Diagnosis not present

## 2018-01-14 NOTE — Assessment & Plan Note (Signed)
Decision today to treat with OMT was based on Physical Exam  After verbal consent patient was treated with HVLA, ME, FPR techniques in cervical, thoracic, rib lumbar and sacral areas  Patient tolerated the procedure well with improvement in symptoms  Patient given exercises, stretches and lifestyle modifications  See medications in patient instructions if given  Patient will follow up in 6-8 weeks 

## 2018-01-14 NOTE — Patient Instructions (Signed)
Good to see you  Gustavus Bryantce is your friend.  Get back to the routine.  See me again in 6-8 weeks

## 2018-01-14 NOTE — Assessment & Plan Note (Addendum)
Mild tightness.  Multifactorial with some mild arthritis and muscle injury.  Discussed icing regimen and home exercise.  Discussed which activities to do which wants to avoid patient will be back into a regular regimen and follow-up with me again in 6 to 8 weeks

## 2018-01-27 ENCOUNTER — Other Ambulatory Visit: Payer: Self-pay | Admitting: Family Medicine

## 2018-01-27 NOTE — Telephone Encounter (Signed)
No longer on medication list.

## 2018-03-03 NOTE — Progress Notes (Signed)
[name] [address] [place], [place] [address] Phone: [phone] Subjective:   Tina [name], am serving as a scribe for Dr. Antoine Primas.  I'm seeing this patient by the request  of:    CC: Neck pain follow-up  BMW:[id number]  Tina [name] is a 72 y.o. female coming in with complaint of neck pain. She was [place] the zoo and did develop some neck pain. She feels like she is doing ok though as her pain subsided. Tightness today.  Patient denies any radiation of the arms or any numbness or tingling.  Has been taking care of her grandkids more recently.     Past Medical History:  Diagnosis Date  . High triglycerides   . Hx of varicella    Past Surgical History:  Procedure Laterality Date  . CHOLECYSTECTOMY     Social History   Socioeconomic History  . Marital status: Divorced    Spouse name: Not on file  . Number of children: Not on file  . Years of education: Not on file  . Highest education level: Not on file  Occupational History  . Occupation: Professor    Employer: [employer]  Social Needs  . Financial resource strain: Not on file  . Food insecurity:    Worry: Not on file    Inability: Not on file  . Transportation needs:    Medical: Not on file    Non-medical: Not on file  Tobacco Use  . Smoking status: Former Smoker    Packs/day: 0.70    Years: 4.00    Pack years: 2.80  . Smokeless tobacco: Never Used  . Tobacco comment: in her 52's   Substance and Sexual Activity  . Alcohol use: Yes    Alcohol/week: 1.0 standard drinks    Types: 1 Standard drinks or equivalent per week    Comment: ocassionally  . Drug use: No  . Sexual activity: Not on file  Lifestyle  . Physical activity:    Days per week: Not on file    Minutes per session: Not on file  . Stress: Not on file  Relationships  . Social connections:    Talks on phone: Not on file    Gets together: Not on file    Attends religious  service: Not on file    Active member of club or organization: Not on file    Attends meetings of clubs or organizations: Not on file    Relationship status: Not on file  Other Topics Concern  . 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    Mother  Moved down to friends home        Divorced one chid and grandchild   Neg ets firearms.    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    Exercises regularly      Neg tad    No Known Allergies Family History  Problem Relation Age of Onset  . Hypertension Mother   . Hyperlipidemia Mother   . Stroke Mother   . Diabetes Father   . Miscarriages / [place] Brother       Current Outpatient Medications (Respiratory):  .  cetirizine (ZYRTEC) 10 MG tablet, Take 10 mg by mouth daily. Marland Kitchen  loratadine (CLARITIN)  10 MG tablet, Take 10 mg by mouth daily.    Current Outpatient Medications (Other):  .  cholecalciferol (VITAMIN D) 1000 units tablet, Take 1,000 Units by mouth daily. .  cycloSPORINE (RESTASIS) 0.05 % ophthalmic emulsion, 1 drop 2 (two) times daily. Hebert Soho, MD gives this to pt. .  MULTIPLE VITAMIN PO, Take by mouth.   .  Omega-3 Fatty Acids (OMEGA 3 PO), Take by mouth.   .  TURMERIC PO, Take by mouth.    Past medical history, social, surgical and family history all reviewed in electronic medical record.  No pertanent information unless stated regarding to the chief complaint.   Review of Systems:  No headache, visual changes, nausea, vomiting, diarrhea, constipation, dizziness, abdominal pain, skin rash, fevers, chills, night sweats, weight loss, swollen lymph nodes, body aches, joint swelling, muscle aches, chest pain, shortness of breath, mood changes.   Objective  There were no vitals taken for this visit. Systems examined below as of    General: No apparent distress alert and oriented x3 mood and affect normal,  dressed appropriately.  HEENT: Pupils equal, extraocular movements intact  Respiratory: Patient's speak in full sentences and does not appear short of breath  Cardiovascular: No lower extremity edema, non tender, no erythema  Skin: Warm dry intact with no signs of infection or rash on extremities or on axial skeleton.  Abdomen: Soft nontender  Neuro: Cranial nerves II through XII are intact, neurovascularly intact in all extremities with 2+ DTRs and 2+ pulses.  Lymph: No lymphadenopathy of posterior or anterior cervical chain or axillae bilaterally.  Gait normal with good balance and coordination.  MSK:  Non tender with full range of motion and good stability and symmetric strength and tone of shoulders, elbows, wrist, hip, knee and ankles bilaterally.  Neck: Inspection loss of lordosis mild retraction of the shoulder blades. No palpable stepoffs. Negative Spurling's maneuver. Full neck range of motion Grip strength and sensation normal in bilateral hands Strength good C4 to T1 distribution No sensory change to C4 to T1 Negative Hoffman sign bilaterally Reflexes normal  Increased tightness of the trapezius and previous exam.  Osteopathic findings C2 flexed rotated and side bent right C5 flexed rotated and side bent left T3 extended rotated and side bent right inhaled third rib  Impression and Recommendations:     This case required medical decision making of moderate complexity. The above documentation has been reviewed and is accurate and complete [name], DO       Note: This dictation was prepared with Dragon dictation along with smaller phrase technology. Any transcriptional errors that result from this process are unintentional.

## 2018-03-04 ENCOUNTER — Encounter: Payer: Self-pay | Admitting: Family Medicine

## 2018-03-04 ENCOUNTER — Ambulatory Visit: Payer: Medicare Other | Admitting: Family Medicine

## 2018-03-04 VITALS — BP 110/80 | HR 79 | Ht 64.0 in | Wt 123.0 lb

## 2018-03-04 DIAGNOSIS — M999 Biomechanical lesion, unspecified: Secondary | ICD-10-CM

## 2018-03-04 DIAGNOSIS — M542 Cervicalgia: Secondary | ICD-10-CM

## 2018-03-04 DIAGNOSIS — M9901 Segmental and somatic dysfunction of cervical region: Secondary | ICD-10-CM

## 2018-03-04 NOTE — Patient Instructions (Signed)
Good to see you  Hope the birds taste good.  Keep hands within peripheral vision  Stay active like always See me again in 6 weeks

## 2018-03-04 NOTE — Assessment & Plan Note (Signed)
Decision today to treat with OMT was based on Physical Exam  After verbal consent patient was treated with HVLA, ME, FPR techniques in cervical, thoracic, rib areas  Patient tolerated the procedure well with improvement in symptoms  Patient given exercises, stretches and lifestyle modifications  See medications in patient instructions if given  Patient will follow up in 6 weeks 

## 2018-03-04 NOTE — Assessment & Plan Note (Addendum)
Neck pain still seems to be multifactorial.  Discussed with patient about posture and ergonomics.  Discussed which activities to doing which wants to avoid.  Patient is to    to be slowly over the course of        several days       and follow-up again    in 6      weeks

## 2018-04-10 NOTE — Progress Notes (Signed)
[name] [address] [place], [place] [address] Phone: [phone] Subjective:    I Ronelle NighKana Thompson am serving as a Neurosurgeonscribe for Dr. Antoine PrimasZachary Lupe Handley.   CC: Back and neck pain follow-up  OZH:[id number]:Subjective  Willette PaJulie Denise is a 72 y.o. female coming in with complaint of back pain. States that she is doing well. Some tightness in the neck. Patient has been doing fairly well.  Doing the exercises more than occasionally.  Patient states that the pain and some tightness on the right side at one point but then back to the regular left side.     Past Medical History:  Diagnosis Date  . High triglycerides   . Hx of varicella    Past Surgical History:  Procedure Laterality Date  . CHOLECYSTECTOMY     Social History   Socioeconomic History  . Marital status: Divorced    Spouse name: Not on file  . Number of children: Not on file  . Years of education: Not on file  . Highest education level: Not on file  Occupational History  . Occupation: Professor    Employer: [employer]  Social Needs  . Financial resource strain: Not on file  . Food insecurity:    Worry: Not on file    Inability: Not on file  . Transportation needs:    Medical: Not on file    Non-medical: Not on file  Tobacco Use  . Smoking status: Former Smoker    Packs/day: 0.70    Years: 4.00    Pack years: 2.80  . Smokeless tobacco: Never Used  . Tobacco comment: in her 6420's   Substance and Sexual Activity  . Alcohol use: Yes    Alcohol/week: 1.0 standard drinks    Types: 1 Standard drinks or equivalent per week    Comment: ocassionally  . Drug use: No  . Sexual activity: Not on file  Lifestyle  . Physical activity:    Days per week: Not on file    Minutes per session: Not on file  . Stress: Not on file  Relationships  . Social connections:    Talks on phone: Not on file    Gets together: Not on file    Attends religious service: Not on file    Active member of club or  organization: Not on file    Attends meetings of clubs or organizations: Not on file    Relationship status: Not on file  Other Topics Concern  . 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    Mother  Moved down to friends home        Divorced one chid and grandchild   Neg ets firearms.    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    Exercises regularly      Neg tad    No Known Allergies Family History  Problem Relation Age of Onset  . Hypertension Mother   . Hyperlipidemia Mother   . Stroke Mother   . Diabetes Father   . M[place]riages / [place]       Current Outpatient Medications (Respiratory):  .  cetirizine (ZYRTEC) 10 MG tablet, Take 10 mg by mouth daily. Marland Kitchen.  loratadine (CLARITIN) 10 MG tablet, Take 10 mg by mouth daily.  Current Outpatient Medications (Other):  .  cholecalciferol (VITAMIN D) 1000 units tablet, Take 1,000 Units by mouth daily. .  MULTIPLE VITAMIN PO, Take by mouth.   .  Omega-3 Fatty Acids (OMEGA 3 PO), Take by mouth.   .  TURMERIC PO, Take by mouth.    Past medical history, social, surgical and family history all reviewed in electronic medical record.  No pertanent information unless stated regarding to the chief complaint.   Review of Systems:  No headache, visual changes, nausea, vomiting, diarrhea, constipation, dizziness, abdominal pain, skin rash, fevers, chills, night sweats, weight loss, swollen lymph nodes, body aches, joint swelling, chest pain, shortness of breath, mood changes.  Positive muscle aches  Objective  Blood pressure 130/70, pulse 63, height 5\' 4"  (1.626 m), weight 123 lb (55.8 kg), SpO2 98 %.    General: No apparent distress alert and oriented x3 mood and affect normal, dressed appropriately.  HEENT: Pupils equal, extraocular movements intact  Respiratory: Patient's speak in full sentences and does not appear  short of breath  Cardiovascular: No lower extremity edema, non tender, no erythema  Skin: Warm dry intact with no signs of infection or rash on extremities or on axial skeleton.  Abdomen: Soft nontender  Neuro: Cranial nerves II through XII are intact, neurovascularly intact in all extremities with 2+ DTRs and 2+ pulses.  Lymph: No lymphadenopathy of posterior or anterior cervical chain or axillae bilaterally.  Gait normal with good balance and coordination.  MSK:  Non tender with full range of motion and good stability and symmetric strength and tone of shoulders, elbows, wrist, hip, knee and ankles bilaterally.  Mild arthritic changes of multiple joints Neck: Inspection mild loss of lordosis. No palpable stepoffs. Negative Spurling's maneuver. Patient does have loss of lordosis. Grip strength and sensation normal in bilateral hands Strength good C4 to T1 distribution No sensory change to C4 to T1 Negative Hoffman sign bilaterally Reflexes normal Tightness of the left trapezius noted  Osteopathic findings C4 flexed rotated and side bent left T3 extended rotated and side bent left inhaled third rib T8 extended rotated and side bent left L2 flexed rotated and side bent right Sacrum right on right    Impression and Recommendations:     This case required medical decision making of moderate complexity. The above documentation has been reviewed and is accurate and complete Judi SaaZachary M Anjelica Gorniak, DO       Note: This dictation was prepared with Dragon dictation along with smaller phrase technology. Any transcriptional errors that result from this process are unintentional.

## 2018-04-11 ENCOUNTER — Encounter: Payer: Self-pay | Admitting: Family Medicine

## 2018-04-11 ENCOUNTER — Ambulatory Visit: Payer: Medicare Other | Admitting: Family Medicine

## 2018-04-11 VITALS — BP 130/70 | HR 63 | Ht 64.0 in | Wt 123.0 lb

## 2018-04-11 DIAGNOSIS — M542 Cervicalgia: Secondary | ICD-10-CM

## 2018-04-11 DIAGNOSIS — M999 Biomechanical lesion, unspecified: Secondary | ICD-10-CM | POA: Diagnosis not present

## 2018-04-11 NOTE — Assessment & Plan Note (Signed)
Discussed posture and ergonomics.  Discussed tightness.  Patient has had tightness overall.  Still responded well to manipulation.  Follow-up again in 6 weeks

## 2018-04-11 NOTE — Assessment & Plan Note (Signed)
Decision today to treat with OMT was based on Physical Exam  After verbal consent patient was treated with HVLA, ME, FPR techniques in cervical, thoracic rib areas  Patient tolerated the procedure well with improvement in symptoms  Patient given exercises, stretches and lifestyle modifications  See medications in patient instructions if given  Patient will follow up in 6 weeks 

## 2018-04-11 NOTE — Patient Instructions (Addendum)
Great to see you  Happy holidays!  Ice 20 minutes 2 times daily. Usually after activity and before bed. Posture is key! On wall with heels, butt shoulder and head touching for a goal of 5 minutes daily  Stay active nexium daily for 2 weeks See me again in 6ish weeks!!!!

## 2018-05-06 NOTE — Progress Notes (Signed)
Chief Complaint  Patient presents with  . digestive issue    Pt states that she is having a lot of gas states that she had bad stomach acid feeling last year when using flonase believes it could be reflux    HPI: Tina [name] 73 y.o. come in for on going  Gi sx  Over the last 6 monthsSx  Off and on   First noted  Early summer  aciditiy  Feeling  Stopped Flonase went away.   stopped al together but comes and goes. Then went to see Dr Katrinka BlazingSmith  .   ? If has reflux.     Advised to try nexium  For a few weeks.   Off for 2 weeks   And helped for a few days.   But back again.    Gas upper stomach.   Not specific  from foods  .  Gas  In upper abd and chest  Takes For vits and tumeric  No problem with diarrhea  And no change bowel habits No fver weigh tloss good otherwise.   ? Could she have   h pylori . ? acquaintance [place] had this  ROS: See pertinent positives and negatives per HPI. No fever weigh t loss   feling overall ill.   Father had lactose intolerance   Neg celiac   Past Medical History:  Diagnosis Date  . High triglycerides   . Hx of varicella     Family History  Problem Relation Age of Onset  . Hypertension Mother   . Hyperlipidemia Mother   . Stroke Mother   . Diabetes Father   . Miscarriages / [place] Brother     Social History   Socioeconomic History  . Marital status: Divorced    Spouse name: Not on file  . Number of children: Not on file  . Years of education: Not on file  . Highest education level: Not on file  Occupational History  . Occupation: Professor    Employer: [employer]  Social Needs  . Financial resource strain: Not on file  . Food insecurity:    Worry: Not on file    Inability: Not on file  . Transportation needs:    Medical: Not on file    Non-medical: Not on file  Tobacco Use  . Smoking status: Former Smoker    Packs/day: 0.70    Years: 4.00    Pack years: 2.80  . Smokeless tobacco: Never Used  . Tobacco comment: in her  3620's   Substance and Sexual Activity  . Alcohol use: Yes    Alcohol/week: 1.0 standard drinks    Types: 1 Standard drinks or equivalent per week    Comment: ocassionally  . Drug use: No  . Sexual activity: Not on file  Lifestyle  . Physical activity:    Days per week: Not on file    Minutes per session: Not on file  . Stress: Not on file  Relationships  . Social connections:    Talks on phone: Not on file    Gets together: Not on file    Attends religious service: Not on file    Active member of club or organization: Not on file    Attends meetings of clubs or organizations: Not on file    Relationship status: Not on file  Other Topics Concern  . 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  Mother  Moved down to friends home        Divorced one chid and grandchild   Neg ets firearms.    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    Exercises regularly      Neg tad     Outpatient Medications Prior to Visit  Medication Sig Dispense Refill  . cetirizine (ZYRTEC) 10 MG tablet Take 10 mg by mouth daily.    . cholecalciferol (VITAMIN D) 1000 units tablet Take 1,000 Units by mouth daily.    Marland Kitchen loratadine (CLARITIN) 10 MG tablet Take 10 mg by mouth daily.    . MULTIPLE VITAMIN PO Take by mouth.      . Omega-3 Fatty Acids (OMEGA 3 PO) Take by mouth.      . TURMERIC PO Take by mouth.     No facility-administered medications prior to visit.      EXAM:  BP 138/84 (BP Location: Right Arm, Patient Position: Sitting, Cuff Size: Normal)   Pulse 71   Temp 98.6 F (37 C) (Oral)   Wt 122 lb 14.4 oz (55.7 kg)   BMI 21.10 kg/m   Body mass index is 21.1 kg/m.  GENERAL: vitals reviewed and listed above, alert, oriented, appears well hydrated and in no acute distress HEENT: atraumatic, conjunctiva  clear, no obvious abnormalities on inspection of external nose and ears  NECK: no  obvious masses on inspection palpation  LUNGS: clear to auscultation bilaterally, no wheezes, rales or rhonchi, good air movement CV: HRRR, no clubbing cyanosis or  peripheral edema nl cap refill  Abdomen:  Sof,t normal bowel sounds without hepatosplenomegaly, no guarding rebound or masses no CVA tenderness  MS: moves all extremities without noticeable focal  abnormality PSYCH: pleasant and cooperative, no obvious depression or anxiety Lab Results  Component Value Date   WBC 4.3 08/01/2017   HGB 13.2 08/01/2017   HCT 38.3 08/01/2017   PLT 316.0 08/01/2017   GLUCOSE 98 08/01/2017   CHOL 191 08/01/2017   TRIG 72.0 08/01/2017   HDL 66.00 08/01/2017   LDLDIRECT 137.3 07/02/2012   LDLCALC 110 (H) 08/01/2017   ALT 17 08/01/2017   AST 19 08/01/2017   NA 138 08/01/2017   K 4.5 08/01/2017   CL 104 08/01/2017   CREATININE 0.77 08/01/2017   BUN 19 08/01/2017   CO2 26 08/01/2017   TSH 2.58 08/01/2017   HGBA1C 5.7 08/01/2017   MICROALBUR 0.7 07/12/2015   BP Readings from Last 3 Encounters:  05/07/18 138/84  04/11/18 130/70  03/04/18 110/80    ASSESSMENT AND PLAN:  Discussed the following assessment and plan:  Abdominal gas pain - Plan: Basic metabolic panel, CBC with Differential/Platelet, TSH, Celiac Disease Comprehensive Panel with Reflexes, H. pylori antigen, stool, H Pylori, IGM, IGG, IGA AB  GI symptom - Plan: Basic metabolic panel, CBC with Differential/Platelet, TSH, Celiac Disease Comprehensive Panel with Reflexes, H. pylori antigen, stool, H Pylori, IGM, IGG, IGA AB Not worse in  Am .  -Patient advised to return or notify health care team  if  new concerns arise.  Patient Instructions  Get stool tests and  Lab work   Try lactose free diet elimination for at least 2 weeks  Stop all non crucial supplements and then add back  Slowly  to see if related .    Consider fodmaps diet  .  Used for gas and irritible bowel .   rov in 1 mos  .      Neta MendsWanda K. Burt Piatek M.D.

## 2018-05-07 ENCOUNTER — Ambulatory Visit: Payer: Medicare Other | Admitting: Internal Medicine

## 2018-05-07 ENCOUNTER — Encounter: Payer: Self-pay | Admitting: Internal Medicine

## 2018-05-07 VITALS — BP 138/84 | HR 71 | Temp 98.6°F | Wt 122.9 lb

## 2018-05-07 DIAGNOSIS — R198 Other specified symptoms and signs involving the digestive system and abdomen: Secondary | ICD-10-CM | POA: Diagnosis not present

## 2018-05-07 DIAGNOSIS — R141 Gas pain: Secondary | ICD-10-CM

## 2018-05-07 LAB — CBC WITH DIFFERENTIAL/PLATELET
BASOS ABS: 0 10*3/uL (ref 0.0–0.1)
Basophils Relative: 0.6 % (ref 0.0–3.0)
EOS ABS: 0.1 10*3/uL (ref 0.0–0.7)
Eosinophils Relative: 1.4 % (ref 0.0–5.0)
HEMATOCRIT: 39.9 % (ref 36.0–46.0)
HEMOGLOBIN: 13.7 g/dL (ref 12.0–15.0)
LYMPHS PCT: 23.8 % (ref 12.0–46.0)
Lymphs Abs: 1.4 10*3/uL (ref 0.7–4.0)
MCHC: 34.4 g/dL (ref 30.0–36.0)
MCV: 87.5 fl (ref 78.0–100.0)
Monocytes Absolute: 0.3 10*3/uL (ref 0.1–1.0)
Monocytes Relative: 5.8 % (ref 3.0–12.0)
Neutro Abs: 4 10*3/uL (ref 1.4–7.7)
Neutrophils Relative %: 68.4 % (ref 43.0–77.0)
Platelets: 320 10*3/uL (ref 150.0–400.0)
RBC: 4.55 Mil/uL (ref 3.87–5.11)
RDW: 13.6 % (ref 11.5–15.5)
WBC: 5.9 10*3/uL (ref 4.0–10.5)

## 2018-05-07 LAB — BASIC METABOLIC PANEL
BUN: 21 mg/dL (ref 6–23)
CALCIUM: 9.9 mg/dL (ref 8.4–10.5)
CHLORIDE: 101 meq/L (ref 96–112)
CO2: 26 mEq/L (ref 19–32)
CREATININE: 0.81 mg/dL (ref 0.40–1.20)
GFR: 74.01 mL/min (ref >60.00)
Glucose, Bld: 109 mg/dL — ABNORMAL HIGH (ref 70–99)
Potassium: 4.6 mEq/L (ref 3.5–5.1)
Sodium: 136 mEq/L (ref 135–145)

## 2018-05-07 LAB — TSH: TSH: 2.28 u[IU]/mL (ref 0.35–4.50)

## 2018-05-07 NOTE — Patient Instructions (Signed)
Get stool tests and  Lab work   Try lactose free diet elimination for at least 2 weeks  Stop all non crucial supplements and then add back  Slowly  to see if related .    Consider fodmaps diet  .   Used for gas and irritible bowel .   rov in 1 mos  .

## 2018-05-08 LAB — CELIAC DISEASE COMPREHENSIVE PANEL WITH REFLEXES
(tTG) Ab, IgA: 1 U/mL
IMMUNOGLOBULIN A: 268 mg/dL (ref 70–320)

## 2018-05-09 LAB — H PYLORI, IGM, IGG, IGA AB: H. pylori, IgG AbS: 0.68 Index Value (ref 0.00–0.79)

## 2018-05-09 LAB — H. PYLORI ANTIGEN, STOOL

## 2018-05-24 NOTE — Progress Notes (Signed)
[name] [address] [place], [place] [address] Phone: [phone] Subjective:    I Tina [name] am serving as a Neurosurgeon for Dr. Antoine Primas.    CC: Back and neck pain follow-up  [id number]  Tina [name] is a 73 y.o. female coming in with complaint of neck pain. Patient states that her neck has been bothering her the past 5 days.  Neck and upper back pain.  Patient has been doing relatively well.  Has responded well to osteopathic manipulation over the course of time.  Patient did not try the PPI.       Past Medical History:  Diagnosis Date  . High triglycerides   . Hx of varicella    Past Surgical History:  Procedure Laterality Date  . CHOLECYSTECTOMY     Social History   Socioeconomic History  . Marital status: Divorced    Spouse name: Not on file  . Number of children: Not on file  . Years of education: Not on file  . Highest education level: Not on file  Occupational History  . Occupation: Professor    Employer: [employer]  Social Needs  . Financial resource strain: Not on file  . Food insecurity:    Worry: Not on file    Inability: Not on file  . Transportation needs:    Medical: Not on file    Non-medical: Not on file  Tobacco Use  . Smoking status: Former Smoker    Packs/day: 0.70    Years: 4.00    Pack years: 2.80  . Smokeless tobacco: Never Used  . Tobacco comment: in her 1's   Substance and Sexual Activity  . Alcohol use: Yes    Alcohol/week: 1.0 standard drinks    Types: 1 Standard drinks or equivalent per week    Comment: ocassionally  . Drug use: No  . Sexual activity: Not on file  Lifestyle  . Physical activity:    Days per week: Not on file    Minutes per session: Not on file  . Stress: Not on file  Relationships  . Social connections:    Talks on phone: Not on file    Gets together: Not on file    Attends religious service: Not on file    Active member of club or organization: Not on file     Attends meetings of clubs or organizations: Not on file    Relationship status: Not on file  Other Topics Concern  . 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    Mother  Moved down to friends home        Divorced one chid and grandchild   Neg ets firearms.    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    Exercises regularly      Neg tad    No Known Allergies Family History  Problem Relation Age of Onset  . Hypertension Mother   . Hyperlipidemia Mother   . Stroke Mother   . Diabetes Father   . Miscarriages / [place] Brother       Current Outpatient Medications (Respiratory):  .  cetirizine (ZYRTEC) 10 MG tablet, Take 10 mg by mouth daily. Marland Kitchen  loratadine (CLARITIN) 10 MG tablet, Take 10 mg by mouth daily.  Current Outpatient Medications (Other):  .  cholecalciferol (VITAMIN D) 1000 units tablet, Take 1,000 Units by mouth daily. .  MULTIPLE VITAMIN PO, Take by mouth.   .  Omega-3 Fatty Acids (OMEGA 3 PO), Take by mouth.   .  TURMERIC PO, Take by mouth.    Past medical history, social, surgical and family history all reviewed in electronic medical record.  No pertanent information unless stated regarding to the chief complaint.   Review of Systems:  No headache, visual changes, nausea, vomiting, diarrhea, constipation, dizziness, abdominal pain, skin rash, fevers, chills, night sweats, weight loss, swollen lymph nodes, body aches, joint swelling, muscle aches, chest pain, shortness of breath, mood changes.   Objective  Blood pressure 140/78, pulse 69, height 5\' 4"  (1.626 m), weight 124 lb (56.2 kg), SpO2 98 %.   General: No apparent distress alert and oriented x3 mood and affect normal, dressed appropriately.  HEENT: Pupils equal, extraocular movements intact  Respiratory: Patient's speak in full sentences and does not appear short of breath  Cardiovascular: No  lower extremity edema, non tender, no erythema  Skin: Warm dry intact with no signs of infection or rash on extremities or on axial skeleton.  Abdomen: Soft nontender  Neuro: Cranial nerves II through XII are intact, neurovascularly intact in all extremities with 2+ DTRs and 2+ pulses.  Lymph: No lymphadenopathy of posterior or anterior cervical chain or axillae bilaterally.  Gait normal with good balance and coordination.  MSK:  tender with mild to moderate range of motion and good stability and symmetric strength and tone of shoulders, elbows, wrist, hip, knee and ankles bilaterally.  Mild arthritic changes  Neck: Inspection loss of lordosis. No palpable stepoffs. Negative Spurling's maneuver. Limited range of motion in all planes lacks last 5 degrees of extension Grip strength and sensation normal in bilateral hands Strength good C4 to T1 distribution No sensory change to C4 to T1 Negative Hoffman sign bilaterally Reflexes normal Tightness of the trapezius right greater than left  Osteopathic findings C2 flexed rotated and side bent right C6 flexed rotated and side bent left T3 extended rotated and side bent left inhaled third rib T7 extended rotated and side bent leftright    Impression and Recommendations:     This case required medical decision making of moderate complexity. The above documentation has been reviewed and is accurate and complete [name], DO       Note: This dictation was prepared with Dragon dictation along with smaller phrase technology. Any transcriptional errors that result from this process are unintentional.

## 2018-05-26 ENCOUNTER — Encounter: Payer: Self-pay | Admitting: Family Medicine

## 2018-05-26 ENCOUNTER — Ambulatory Visit: Payer: Medicare Other | Admitting: Family Medicine

## 2018-05-26 VITALS — BP 140/78 | HR 69 | Ht 64.0 in | Wt 124.0 lb

## 2018-05-26 DIAGNOSIS — M999 Biomechanical lesion, unspecified: Secondary | ICD-10-CM

## 2018-05-26 DIAGNOSIS — M542 Cervicalgia: Secondary | ICD-10-CM

## 2018-05-26 DIAGNOSIS — M546 Pain in thoracic spine: Secondary | ICD-10-CM

## 2018-05-26 NOTE — Assessment & Plan Note (Signed)
Decision today to treat with OMT was based on Physical Exam  After verbal consent patient was treated with HVLA, ME, FPR techniques in cervical, thoracic, rib, areas  Patient tolerated the procedure well with improvement in symptoms  Patient given exercises, stretches and lifestyle modifications  See medications in patient instructions if given  Patient will follow up in 6 weeks 

## 2018-05-26 NOTE — Patient Instructions (Signed)
Look up eldora  See me again in 6 ish weeks

## 2018-05-26 NOTE — Assessment & Plan Note (Signed)
Discussed posture and ergonomics.  Discussed which activities to do which also avoid.  Discussed icing regimen.  Follow-up again in 6 weeks

## 2018-06-04 NOTE — Progress Notes (Signed)
Chief Complaint  Patient presents with  . Follow-up    pt states she is feeling better states that its been mostly gas and thinks she is lactose intolerated     HPI: Tina [name] 73 y.o. come in for Fu of gi sx of uncertain  etiology  Since last time   Stopped supplements and then lactose .  varioes day to day  Feels sorbital may also be a culprit has noted that other sugars usually nonabsorbable ingredients may be causing symptoms.  Feeling in throat also drainage vs relux   Hard to see  pnd as had a cold recently  No cough vomiting   ocass dyspepsia  Cant tell when   No vomiting  Last time no gi sx concerns was over a year ago .  No fever weigh  Loss  Last colon was 2024 dr Loreta [address]   ROS: See pertinent positives and negatives per HPI.  Past Medical History:  Diagnosis Date  . High triglycerides   . Hx of varicella     Family History  Problem Relation Age of Onset  . Hypertension Mother   . Hyperlipidemia Mother   . Stroke Mother   . Diabetes Father   . Miscarriages / [place] Brother     Social History   Socioeconomic History  . Marital status: Divorced    Spouse name: Not on file  . Number of children: Not on file  . Years of education: Not on file  . Highest education level: Not on file  Occupational History  . Occupation: Professor    Employer: [employer]  Social Needs  . Financial resource strain: Not on file  . Food insecurity:    Worry: Not on file    Inability: Not on file  . Transportation needs:    Medical: Not on file    Non-medical: Not on file  Tobacco Use  . Smoking status: Former Smoker    Packs/day: 0.70    Years: 4.00    Pack years: 2.80  . Smokeless tobacco: Never Used  . Tobacco comment: in her 47's   Substance and Sexual Activity  . Alcohol use: Yes    Alcohol/week: 1.0 standard drinks    Types: 1 Standard drinks or equivalent per week    Comment: ocassionally  . Drug use: No  . Sexual activity: Not on file  Lifestyle  .  Physical activity:    Days per week: Not on file    Minutes per session: Not on file  . Stress: Not on file  Relationships  . Social connections:    Talks on phone: Not on file    Gets together: Not on file    Attends religious service: Not on file    Active member of club or organization: Not on file    Attends meetings of clubs or organizations: Not on file    Relationship status: Not on file  Other Topics Concern  . 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    Mother  Moved down to friends home        Divorced one chid and grandchild   Neg ets firearms.    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    Exercises regularly      Neg tad  Outpatient Medications Prior to Visit  Medication Sig Dispense Refill  . loratadine (CLARITIN) 10 MG tablet Take 10 mg by mouth daily.    . cetirizine (ZYRTEC) 10 MG tablet Take 10 mg by mouth daily.    . cholecalciferol (VITAMIN D) 1000 units tablet Take 1,000 Units by mouth daily.    . MULTIPLE VITAMIN PO Take by mouth.      . Omega-3 Fatty Acids (OMEGA 3 PO) Take by mouth.      . TURMERIC PO Take by mouth.     No facility-administered medications prior to visit.      EXAM:  BP 124/82 (BP Location: Right Arm, Patient Position: Sitting, Cuff Size: Normal)   Pulse 87   Temp 99 F (37.2 C) (Oral)   Wt 124 lb (56.2 kg)   BMI 21.28 kg/m   Body mass index is 21.28 kg/m.  GENERAL: vitals reviewed and listed above, alert, oriented, appears well hydrated and in no acute distress HEENT: atraumatic, conjunctiva  clear, no obvious abnormalities on inspection of external nose and ears NECK: no obvious masses on inspection palpation  LUNGS: clear to auscultation bilaterally, no wheezes, rales or rhonchi, good air movement Abdomen soft without organomegaly guarding or rebound.  Bowel sounds appear to be normal MS: moves all extremities  without noticeable focal  abnormality PSYCH: pleasant and cooperative, no obvious depression or anxiety Lab Results  Component Value Date   WBC 5.9 05/07/2018   HGB 13.7 05/07/2018   HCT 39.9 05/07/2018   PLT 320.0 05/07/2018   GLUCOSE 109 (H) 05/07/2018   CHOL 191 08/01/2017   TRIG 72.0 08/01/2017   HDL 66.00 08/01/2017   LDLDIRECT 137.3 07/02/2012   LDLCALC 110 (H) 08/01/2017   ALT 17 08/01/2017   AST 19 08/01/2017   NA 136 05/07/2018   K 4.6 05/07/2018   CL 101 05/07/2018   CREATININE 0.81 05/07/2018   BUN 21 05/07/2018   CO2 26 05/07/2018   TSH 2.28 05/07/2018   HGBA1C 5.7 08/01/2017   MICROALBUR 0.7 07/12/2015   BP Readings from Last 3 Encounters:  06/06/18 124/82  05/26/18 140/78  05/07/18 138/84    ASSESSMENT AND PLAN:  Discussed the following assessment and plan:  GI symptom - Plan: Ambulatory referral to Gastroenterology  Abdominal gas pain - poss lactose intolerance  other new onset opver past year comes and goes  - Plan: Ambulatory referral to Gastroenterology  Dyspepsia ? - Plan: Ambulatory referral to Gastroenterology Overall she looks pretty well however this is been a change in her GI function over the last year.  Her stool for H. pylori specimen was not processed and therefore do not have a final test.  She has improved on change in her diet but still has some ongoing symptoms some related to postprandial gas or discomfort and some related to upper esophageal versus postnasal drainage. None of these are alarm symptoms however they are newer in the last year. Plan to get her to her last gastroenterologist Dr. Loreta Ave for opinion of advice if any other further work-up is advised such as upper endoscopy H. pylori check more definitive diagnosis small bowel overgrowth etc. patient agrees with plan    will be out of [place]b 16- 23  -Patient advised to return or notify health care team  if  new concerns arise.  Patient Instructions  Will  Do a referral to  dr Loreta Ave for opinion But still do  Specific  avoidance  As  we discussed and have  a good trip.    Neta MendsWanda K. Chantel Teti M.D.

## 2018-06-06 ENCOUNTER — Encounter: Payer: Self-pay | Admitting: Internal Medicine

## 2018-06-06 ENCOUNTER — Ambulatory Visit: Payer: Medicare Other | Admitting: Internal Medicine

## 2018-06-06 VITALS — BP 124/82 | HR 87 | Temp 99.0°F | Wt 124.0 lb

## 2018-06-06 DIAGNOSIS — R1013 Epigastric pain: Secondary | ICD-10-CM

## 2018-06-06 DIAGNOSIS — R198 Other specified symptoms and signs involving the digestive system and abdomen: Secondary | ICD-10-CM

## 2018-06-06 DIAGNOSIS — R141 Gas pain: Secondary | ICD-10-CM | POA: Diagnosis not present

## 2018-06-06 NOTE — Patient Instructions (Signed)
Will  Do a referral to dr Loreta Ave for opinion But still do  Specific  avoidance  As  we discussed and have a good trip.

## 2018-06-18 NOTE — Telephone Encounter (Signed)
That's super interesting  And glad you had a good trip.   You could delay  Seeing gi      But may still want to see if  Recurring and ongoing .

## 2018-07-08 ENCOUNTER — Ambulatory Visit: Payer: Medicare Other | Admitting: Family Medicine

## 2018-07-16 ENCOUNTER — Ambulatory Visit: Payer: Medicare Other | Admitting: Family Medicine

## 2018-07-31 NOTE — Telephone Encounter (Signed)
If tick was  On less than 72 hours  Risk of any infection is very low . If not sure can make a video visit  Today to discuss

## 2018-08-04 ENCOUNTER — Encounter: Payer: Medicare Other | Admitting: Internal Medicine

## 2018-08-05 ENCOUNTER — Telehealth: Payer: Self-pay

## 2018-08-05 NOTE — Telephone Encounter (Signed)
Called and rescheduled patient 4/23 appointment to 5/7

## 2018-08-14 ENCOUNTER — Ambulatory Visit: Payer: Medicare Other | Admitting: Family Medicine

## 2018-08-28 ENCOUNTER — Ambulatory Visit: Payer: Medicare Other | Admitting: Family Medicine

## 2018-08-28 ENCOUNTER — Other Ambulatory Visit: Payer: Self-pay

## 2018-08-28 ENCOUNTER — Encounter: Payer: Self-pay | Admitting: Family Medicine

## 2018-08-28 VITALS — BP 118/82 | HR 87 | Ht 64.0 in | Wt 127.0 lb

## 2018-08-28 DIAGNOSIS — M9901 Segmental and somatic dysfunction of cervical region: Secondary | ICD-10-CM

## 2018-08-28 DIAGNOSIS — M9908 Segmental and somatic dysfunction of rib cage: Secondary | ICD-10-CM | POA: Diagnosis not present

## 2018-08-28 DIAGNOSIS — M542 Cervicalgia: Secondary | ICD-10-CM | POA: Diagnosis not present

## 2018-08-28 DIAGNOSIS — M999 Biomechanical lesion, unspecified: Secondary | ICD-10-CM

## 2018-08-28 DIAGNOSIS — M9902 Segmental and somatic dysfunction of thoracic region: Secondary | ICD-10-CM | POA: Diagnosis not present

## 2018-08-28 NOTE — Assessment & Plan Note (Signed)
Decision today to treat with OMT was based on Physical Exam  After verbal consent patient was treated with HVLA, ME, FPR techniques in cervical, thoracic, rib areas  Patient tolerated the procedure well with improvement in symptoms  Patient given exercises, stretches and lifestyle modifications  See medications in patient instructions if given  Patient will follow up in 6 weeks 

## 2018-08-28 NOTE — Assessment & Plan Note (Signed)
Neck pain continues to be posture and muscle imbalances.  Discussed with patient about ergonomics.  Discussed repetitive activity and posture training exercises again.  Responding well to manipulation.  Follow-up again 6 weeks

## 2018-08-28 NOTE — Patient Instructions (Signed)
Good to see you  You are doing great  Watch the repetitive movements See me again in 6 weeks

## 2018-08-28 NOTE — Progress Notes (Signed)
[facility] D.[facility] [address] [place], [place] [address] Phone: [phone] Subjective:   [name], Valerie Wolf, am serving as a scribe for Dr. Antoine PrimasZachary Ziquan Fidel.   CC: back and neck pain   MWU:[id number]:Subjective  Tina PaJulie Snipe is a 73 y.o. female coming in with complaint of neck pain. Patient is here for OMT to manage her neck pain. She has been feeling ok despite not getting in for an appointment due to COVID.  patient has been doing relatively well.  More repetitive motions patient has been gardening more frequently.  Some mild right shoulder pain but nothing severe.     Past Medical History:  Diagnosis Date  . High triglycerides   . Hx of varicella    Past Surgical History:  Procedure Laterality Date  . CHOLECYSTECTOMY     Social History   Socioeconomic History  . Marital status: Divorced    Spouse name: Not on file  . Number of children: Not on file  . Years of education: Not on file  . Highest education level: Not on file  Occupational History  . Occupation: Professor    Employer: [employer]  Social Needs  . Financial resource strain: Not on file  . Food insecurity:    Worry: Not on file    Inability: Not on file  . Transportation needs:    Medical: Not on file    Non-medical: Not on file  Tobacco Use  . Smoking status: Former Smoker    Packs/day: 0.70    Years: 4.00    Pack years: 2.80  . Smokeless tobacco: Never Used  . Tobacco comment: in her 620's   Substance and Sexual Activity  . Alcohol use: Yes    Alcohol/week: 1.0 standard drinks    Types: 1 Standard drinks or equivalent per week    Comment: ocassionally  . Drug use: No  . Sexual activity: Not on file  Lifestyle  . Physical activity:    Days per week: Not on file    Minutes per session: Not on file  . Stress: Not on file  Relationships  . Social connections:    Talks on phone: Not on file    Gets together: Not on file    Attends religious service: Not on file    Active member  of club or organization: Not on file    Attends meetings of clubs or organizations: Not on file    Relationship status: Not on file  Other Topics Concern  . 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    Mother  Moved down to friends home        Divorced one chid and grandchild   Neg ets firearms.    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    Exercises regularly      Neg tad    No Known Allergies Family History  Problem Relation Age of Onset  . Hypertension Mother   . Hyperlipidemia Mother   . Stroke Mother   . Diabetes Father   . [place]       Current Outpatient Medications (Respiratory):  .  cetirizine (ZYRTEC) 10 MG tablet, Take 10 mg by mouth daily. Marland Kitchen.  loratadine (CLARITIN) 10 MG tablet, Take 10 mg by mouth daily.  Current Outpatient Medications (Other):  .  cholecalciferol (VITAMIN D) 1000 units tablet, Take 1,000 Units by mouth daily. .  MULTIPLE VITAMIN PO, Take by mouth.   .  Omega-3 Fatty Acids (OMEGA 3 PO), Take by mouth.   .  TURMERIC PO, Take by mouth.    Past medical history, social, surgical and family history all reviewed in electronic medical record.  No pertanent information unless stated regarding to the chief complaint.   Review of Systems:  No headache, visual changes, nausea, vomiting, diarrhea, constipation, dizziness, abdominal pain, skin rash, fevers, chills, night sweats, weight loss, swollen lymph nodes, body aches, joint swelling, chest pain, shortness of breath, mood changes.  Positive muscle aches  Objective  Blood pressure 118/82, pulse 87, height 5\' 4"  (1.626 m), weight 127 lb (57.6 kg), SpO2 98 %.    General: No apparent distress alert and oriented x3 mood and affect normal, dressed appropriately.  HEENT: Pupils equal, extraocular movements intact  Respiratory: Patient's speak in full sentences and does not  appear short of breath  Cardiovascular: No lower extremity edema, non tender, no erythema  Skin: Warm dry intact with no signs of infection or rash on extremities or on axial skeleton.  Abdomen: Soft nontender  Neuro: Cranial nerves II through XII are intact, neurovascularly intact in all extremities with 2+ DTRs and 2+ pulses.  Lymph: No lymphadenopathy of posterior or anterior cervical chain or axillae bilaterally.  Gait normal with good balance and coordination.  MSK:  Non tender with full range of motion and good stability and symmetric strength and tone of shoulders, elbows, wrist, hip, knee and ankles bilaterally.  Neck: Inspection unremarkable. No palpable stepoffs. Negative Spurling's maneuver. Full neck range of motion Grip strength and sensation normal in bilateral hands Strength good C4 to T1 distribution No sensory change to C4 to T1 Negative Hoffman sign bilaterally Reflexes normal  Back Exam:  Inspection: Mild loss of lordosis Motion: Flexion 35 deg, Extension 25 deg, Side Bending to 30 deg bilaterally,  Rotation to 45 deg bilaterally  SLR laying: Negative  XSLR laying: Negative  Palpable tenderness: Palpation paraspinal musculature lumbar spine. FABER: negative. Sensory change: Gross sensation intact to all lumbar and sacral dermatomes.  Reflexes: 2+ at both patellar tendons, 2+ at achilles tendons, Babinski's downgoing.  Strength at foot  Plantar-flexion: 5/5 Dorsi-flexion: 5/5 Eversion: 5/5 Inversion: 5/5  Leg strength  Quad: 5/5 Hamstring: 5/5 Hip flexor: 5/5 Hip abductors: 5/5  Gait unremarkable.  Osteopathic findings C2 flexed rotated and side bent right T3 extended rotated and side bent right inhaled third rib T9 extended rotated and side bent left     Impression and Recommendations:     This case required medical decision making of moderate complexity. The above documentation has been reviewed and is accurate and complete [name], DO        Note: This dictation was prepared with Dragon dictation along with smaller phrase technology. Any transcriptional errors that result from this process are unintentional.

## 2018-10-09 ENCOUNTER — Ambulatory Visit (INDEPENDENT_AMBULATORY_CARE_PROVIDER_SITE_OTHER): Payer: Medicare Other | Admitting: Family Medicine

## 2018-10-09 ENCOUNTER — Other Ambulatory Visit: Payer: Self-pay

## 2018-10-09 ENCOUNTER — Encounter: Payer: Self-pay | Admitting: Family Medicine

## 2018-10-09 VITALS — BP 124/82 | HR 66 | Ht 64.0 in | Wt 123.0 lb

## 2018-10-09 DIAGNOSIS — M9901 Segmental and somatic dysfunction of cervical region: Secondary | ICD-10-CM | POA: Diagnosis not present

## 2018-10-09 DIAGNOSIS — M9908 Segmental and somatic dysfunction of rib cage: Secondary | ICD-10-CM | POA: Diagnosis not present

## 2018-10-09 DIAGNOSIS — M542 Cervicalgia: Secondary | ICD-10-CM | POA: Diagnosis not present

## 2018-10-09 DIAGNOSIS — M999 Biomechanical lesion, unspecified: Secondary | ICD-10-CM | POA: Diagnosis not present

## 2018-10-09 DIAGNOSIS — M9902 Segmental and somatic dysfunction of thoracic region: Secondary | ICD-10-CM | POA: Diagnosis not present

## 2018-10-09 NOTE — Assessment & Plan Note (Signed)
Doing significantly better at this time.  Discussed with patient in range of motion and home exercises.  Discussed repeat residual which wants to avoid.  Patient is to increase activity slowly.  Follow-up again in 4 to 8 weeks

## 2018-10-09 NOTE — Assessment & Plan Note (Signed)
Decision today to treat with OMT was based on Physical Exam  After verbal consent patient was treated with HVLA, ME, FPR techniques in cervical, thoracic, rib areas  Patient tolerated the procedure well with improvement in symptoms  Patient given exercises, stretches and lifestyle modifications  See medications in patient instructions if given  Patient will follow up in 4-8 weeks 

## 2018-10-09 NOTE — Progress Notes (Signed)
Tawana ScaleZach Finnlee Silvernail D.O. [facility] [address] [place], [place] [address] Phone: [phone] Subjective:   [name], Valerie Wolf, am serving as a scribe for Dr. Antoine PrimasZachary Rayetta Veith.    CC: Back pain follow-up  BJY:[id number]:Subjective  Tina PaJulie [name] is a 73 y.o. female coming in with complaint of neck and thoracic spine pain. Patient states that she has been doing fairly well since her last visit. Has been walking and doing exercise classes outside.  Patient has been feeling better overall.      Past Medical History:  Diagnosis Date  . High triglycerides   . Hx of varicella    Past Surgical History:  Procedure Laterality Date  . CHOLECYSTECTOMY     Social History   Socioeconomic History  . Marital status: Divorced    Spouse name: Not on file  . Number of children: Not on file  . Years of education: Not on file  . Highest education level: Not on file  Occupational History  . Occupation: Professor    Employer: [employer]  Social Needs  . Financial resource strain: Not on file  . Food insecurity    Worry: Not on file    Inability: Not on file  . Transportation needs    Medical: Not on file    Non-medical: Not on file  Tobacco Use  . Smoking status: Former Smoker    Packs/day: 0.70    Years: 4.00    Pack years: 2.80  . Smokeless tobacco: Never Used  . Tobacco comment: in her 220's   Substance and Sexual Activity  . Alcohol use: Yes    Alcohol/week: 1.0 standard drinks    Types: 1 Standard drinks or equivalent per week    Comment: ocassionally  . Drug use: No  . Sexual activity: Not on file  Lifestyle  . Physical activity    Days per week: Not on file    Minutes per session: Not on file  . Stress: Not on file  Relationships  . Social Musicianconnections    Talks on phone: Not on file    Gets together: Not on file    Attends religious service: Not on file    Active member of club or organization: Not on file    Attends meetings of clubs or organizations: Not on file   Relationship status: Not on file  Other Topics Concern  . 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    Mother  Moved down to friends home        Divorced one chid and grandchild   Neg ets firearms.    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    Exercises regularly      Neg tad    No Known Allergies Family History  Problem Relation Age of Onset  . Hypertension Mother   . Hyperlipidemia Mother   . Stroke Mother   . Diabetes Father   . Miscarriages / [place]       Current Outpatient Medications (Respiratory):  .  cetirizine (ZYRTEC) 10 MG tablet, Take 10 mg by mouth daily. Marland Kitchen.  loratadine (CLARITIN) 10 MG tablet, Take 10 mg by mouth daily.    Current Outpatient Medications (Other):  .  cholecalciferol (VITAMIN D) 1000 units tablet, Take 1,000 Units by mouth daily. .Marland Kitchen  MULTIPLE VITAMIN PO, Take by mouth.   .  Omega-3 Fatty Acids (OMEGA 3 PO), Take by mouth.   .  TURMERIC PO, Take by mouth.    Past medical history, social, surgical and family history all reviewed in electronic medical record.  No pertanent information unless stated regarding to the chief complaint.   Review of Systems:  No headache, visual changes, nausea, vomiting, diarrhea, constipation, dizziness, abdominal pain, skin rash, fevers, chills, night sweats, weight loss, swollen lymph nodes, body aches, joint swelling, muscle aches, chest pain, shortness of breath, mood changes.   Objective  Blood pressure 124/82, pulse 66, height 5\' 4"  (1.626 m), weight 123 lb (55.8 kg), SpO2 99 %.   General: No apparent distress alert and oriented x3 mood and affect normal, dressed appropriately.  HEENT: Pupils equal, extraocular movements intact  Respiratory: Patient's speak in full sentences and does not appear short of breath  Cardiovascular: No lower extremity edema, non tender, no erythema  Skin: Warm dry  intact with no signs of infection or rash on extremities or on axial skeleton.  Abdomen: Soft nontender  Neuro: Cranial nerves II through XII are intact, neurovascularly intact in all extremities with 2+ DTRs and 2+ pulses.  Lymph: No lymphadenopathy of posterior or anterior cervical chain or axillae bilaterally.  Gait normal with good balance and coordination.  MSK:  Non tender with full range of motion and good stability and symmetric strength and tone of shoulders, elbows, wrist, hip, knee and ankles bilaterally.  Mild arthritic changes of multiple joints Patient's neckback exam does have some very mild loss of lordosis.  Near full range of motion.  5/5 Strength of the extremities  Osteopathic findings C2 flexed rotated and side bent right C6 flexed rotated and side bent left T3 extended rotated and side bent right inhaled third rib T9 extended rotated and side bent left      Impression and Recommendations:     This case required medical decision making of moderate complexity. The above documentation has been reviewed and is accurate and complete Lyndal Pulley, DO       Note: This dictation was prepared with Dragon dictation along with smaller phrase technology. Any transcriptional errors that result from this process are unintentional.

## 2018-10-09 NOTE — Patient Instructions (Signed)
You are doing great! See you in 6-8 weeks

## 2018-11-14 ENCOUNTER — Other Ambulatory Visit: Payer: Self-pay

## 2018-11-14 ENCOUNTER — Encounter: Payer: Self-pay | Admitting: Internal Medicine

## 2018-11-14 ENCOUNTER — Ambulatory Visit (INDEPENDENT_AMBULATORY_CARE_PROVIDER_SITE_OTHER): Payer: Medicare Other | Admitting: Internal Medicine

## 2018-11-14 VITALS — BP 120/64 | HR 86 | Temp 99.2°F | Ht 64.0 in | Wt 121.4 lb

## 2018-11-14 DIAGNOSIS — R739 Hyperglycemia, unspecified: Secondary | ICD-10-CM

## 2018-11-14 DIAGNOSIS — J302 Other seasonal allergic rhinitis: Secondary | ICD-10-CM | POA: Diagnosis not present

## 2018-11-14 DIAGNOSIS — E782 Mixed hyperlipidemia: Secondary | ICD-10-CM | POA: Diagnosis not present

## 2018-11-14 DIAGNOSIS — Z79899 Other long term (current) drug therapy: Secondary | ICD-10-CM

## 2018-11-14 DIAGNOSIS — Z0000 Encounter for general adult medical examination without abnormal findings: Secondary | ICD-10-CM

## 2018-11-14 DIAGNOSIS — E2839 Other primary ovarian failure: Secondary | ICD-10-CM

## 2018-11-14 DIAGNOSIS — Z1159 Encounter for screening for other viral diseases: Secondary | ICD-10-CM

## 2018-11-14 LAB — BASIC METABOLIC PANEL
BUN: 21 mg/dL (ref 6–23)
CO2: 27 mEq/L (ref 19–32)
Calcium: 9.4 mg/dL (ref 8.4–10.5)
Chloride: 104 mEq/L (ref 96–112)
Creatinine, Ser: 0.74 mg/dL (ref 0.40–1.20)
GFR: 77.17 mL/min (ref >60.00)
Glucose, Bld: 98 mg/dL (ref 70–99)
Potassium: 4.6 mEq/L (ref 3.5–5.1)
Sodium: 138 mEq/L (ref 135–145)

## 2018-11-14 LAB — TSH: TSH: 1.3 u[IU]/mL (ref 0.35–4.50)

## 2018-11-14 LAB — HEPATIC FUNCTION PANEL
ALT: 18 U/L (ref 0–35)
AST: 19 U/L (ref 0–37)
Albumin: 4.4 g/dL (ref 3.5–5.2)
Alkaline Phosphatase: 74 U/L (ref 39–117)
Bilirubin, Direct: 0.1 mg/dL (ref 0.0–0.3)
Total Bilirubin: 0.4 mg/dL (ref 0.2–1.2)
Total Protein: 6.7 g/dL (ref 6.0–8.3)

## 2018-11-14 LAB — LIPID PANEL
Cholesterol: 200 mg/dL (ref 0–200)
HDL: 62.1 mg/dL (ref >39.00)
LDL Cholesterol: 119 mg/dL — ABNORMAL HIGH (ref 0–99)
NonHDL: 138.15
Total CHOL/HDL Ratio: 3
Triglycerides: 96 mg/dL (ref 0.0–149.0)
VLDL: 19.2 mg/dL (ref 0.0–40.0)

## 2018-11-14 LAB — HEMOGLOBIN A1C: Hgb A1c MFr Bld: 5.8 % (ref 4.6–6.5)

## 2018-11-14 NOTE — Addendum Note (Signed)
Addended by: Diona Foley on: 11/14/2018 05:42 PM   Modules accepted: Orders

## 2018-11-14 NOTE — Progress Notes (Signed)
Chief Complaint  Patient presents with  . Annual Exam    Pt has no concerns today     HPI: Tina [name] 73 y.o. comes in today for Preventive Medicare exam/ wellness visit .She is doing well     No new concerns  Gi better  elr diet changes and pepcid as needed  Health Maintenance  Topic Date Due  . Hepatitis C Screening  1945/08/24  . HEMOGLOBIN A1C  01/31/2018  . OPHTHALMOLOGY EXAM  11/14/2018 (Originally 11/21/2016)  . INFLUENZA VACCINE  11/22/2018  . MAMMOGRAM  08/28/2019  . COLONOSCOPY  09/13/2022  . TETANUS/TDAP  11/22/2026  . DEXA SCAN  Completed  . PNA vac Low Risk Adult  Completed   Health Maintenance Review LIFESTYLE:  Exercise:   A lot  Walking  And  [place] outside .  Tobacco/ETS: no Alcohol: no Sugar beverages:no Sleep:6-7  Drug use: no HH: 1  plus dog  Retired  [employer]  Grandkids.   Father age 72 dm doinc well isolating    Hearing: ok   Vision:  No limitations at present . Last eye check UTD glasses   Safety:  Has smoke detector and wears seat belts.   No excess sun exposure. Sees dentist regularly.  Falls: .no  Memory: Felt to be good  , no concern from her or her family.  Depression: No anhedonia unusual crying or depressive symptoms   Nutrition: Eats well balanced diet; adequate calcium and vitamin D. No swallowing chewing problems.  Injury: no major injuries in the last six months.  Other healthcare providers:  Reviewed today .  Preventive parameters: up-to-date  Reviewed   ADLS:   There are no problems or need for assistance  driving, feeding, obtaining food, dressing, toileting and bathing, managing money using phone. She is independent.   EXERCISE/ HABITS  Per week   No tobacco    e ROS:  GEN/ HEENT: No fever, significant weight changes sweats headaches vision problems hearing changes, CV/ PULM; No chest pain shortness of breath cough, syncope,edema  change in exercise tolerance. GI /GU: No adominal pain, vomiting, change in bowel habits. No  blood in the stool. No significant GU symptoms. SKIN/HEME: ,no acute skin rashes suspicious lesions or bleeding. No lymphadenopathy, nodules, masses.  NEURO/ PSYCH:  No neurologic signs such as weakness numbness. No depression anxiety. IMM/ Allergy: No unusual infections.  Allergy .   REST of 12 system review negative except as per HPI   Past Medical History:  Diagnosis Date  . High triglycerides   . Hx of varicella     Family History  Problem Relation Age of Onset  . Hypertension Mother   . Hyperlipidemia Mother   . Stroke Mother   . Diabetes Father   . Miscarriages / [place] Brother     Social History   Socioeconomic History  . Marital status: Divorced    Spouse name: Not on file  . Number of children: Not on file  . Years of education: Not on file  . Highest education level: Not on file  Occupational History  . Occupation: Professor    Employer: [employer]  Social Needs  . Financial resource strain: Not on file  . Food insecurity    Worry: Not on file    Inability: Not on file  . Transportation needs    Medical: Not on file    Non-medical: Not on file  Tobacco Use  . Smoking status: Former Smoker    Packs/day: 0.70  Years: 4.00    Pack years: 2.80  . Smokeless tobacco: Never Used  . Tobacco comment: in her 1920's   Substance and Sexual Activity  . Alcohol use: Yes    Alcohol/week: 1.0 standard drinks    Types: 1 Standard drinks or equivalent per week    Comment: ocassionally  . Drug use: No  . Sexual activity: Not on file  Lifestyle  . Physical activity    Days per week: Not on file    Minutes per session: Not on file  . Stress: Not on file  Relationships  . Social Musicianconnections    Talks on phone: Not on file    Gets together: Not on file    Attends religious service: Not on file    Active member of club or organization: Not on file    Attends meetings of clubs or organizations: Not on file    Relationship status: Not on file  Other Topics  Concern  . 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    Mother  Moved down to friends home        Divorced one chid and grandchild   Neg ets firearms.    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    Exercises regularly      Neg tad     Outpatient Encounter Medications as of 11/14/2018  Medication Sig  . cetirizine (ZYRTEC) 10 MG tablet Take 10 mg by mouth daily.  . cholecalciferol (VITAMIN D) 1000 units tablet Take 1,000 Units by mouth daily.  . famotidine (PEPCID) 20 MG tablet Take 20 mg by mouth 2 (two) times daily.  Marland Kitchen. loratadine (CLARITIN) 10 MG tablet Take 10 mg by mouth daily.  . MULTIPLE VITAMIN PO Take by mouth.    . Omega-3 Fatty Acids (OMEGA 3 PO) Take by mouth.    . TURMERIC PO Take by mouth.   No facility-administered encounter medications on file as of 11/14/2018.     EXAM:  BP 120/64 (BP Location: Right Arm, Patient Position: Sitting, Cuff Size: Normal)   Pulse 86   Temp 99.2 F (37.3 C) (Oral)   Ht 5\' 4"  (1.626 m)   Wt 121 lb 6.4 oz (55.1 kg)   SpO2 96%   BMI 20.84 kg/m   Body mass index is 20.84 kg/m.  Physical Exam: Vital signs reviewed YQI:[id number]:This is a well-developed well-nourished alert cooperative   who appears stated age in no acute distress.  HEENT: normocephalic atraumatic , Eyes: PERRL EOM's full, conjunctiva clear, Nares: paten,t no deformity discharge or tenderness., Ears: no deformity EAC's clear TMs with normal landmarks. Mouth: clear OP, no lesions, edema.  Moist mucous membranes. Dentition in adequate repair. NECK: supple without masses, thyromegaly or bruits. CHEST/PULM:  Clear to auscultation and percussion breath sounds equal no wheeze , rales or rhonchi. No chest wall deformities or tenderness. CV: PMI is nondisplaced, S1 S2 no gallops, murmurs, rubs. Peripheral pulses are full without delay.No JVD . Breast: normal by inspection . No dimpling,  discharge, masses, tenderness or discharge . ABDOMEN: Bowel sounds normal nontender  No guard or rebound, no hepato splenomegal no CVA tenderness.   Extremtities:  No clubbing cyanosis or edema, no acute joint swelling or redness no focal atrophy NEURO:  Oriented x3, cranial nerves 3-12 appear to  be intact, no obvious focal weakness,gait within normal limits no abnormal reflexes or asymmetrical SKIN: No acute rashes normal turgor, color, no bruising or petechiae. PSYCH: Oriented, good eye contact, no obvious depression anxiety, cognition and judgment appear normal. LN: no cervical axillary inguinal adenopathy No noted deficits in memory, attention, and speech.   Lab Results  Component Value Date   WBC 5.9 05/07/2018   HGB 13.7 05/07/2018   HCT 39.9 05/07/2018   PLT 320.0 05/07/2018   GLUCOSE 109 (H) 05/07/2018   CHOL 191 08/01/2017   TRIG 72.0 08/01/2017   HDL 66.00 08/01/2017   LDLDIRECT 137.3 07/02/2012   LDLCALC 110 (H) 08/01/2017   ALT 17 08/01/2017   AST 19 08/01/2017   NA 136 05/07/2018   K 4.6 05/07/2018   CL 101 05/07/2018   CREATININE 0.81 05/07/2018   BUN 21 05/07/2018   CO2 26 05/07/2018   TSH 2.28 05/07/2018   HGBA1C 5.7 08/01/2017   MICROALBUR 0.7 07/12/2015   Above lab non fasting but will update  Had breakfast this am  ASSESSMENT AND PLAN:  Discussed the following assessment and plan:   ICD-10-CM   1. Visit for preventive health examination  Z00.00   2. Medication management  A63.016 Basic metabolic panel    Hemoglobin A1c    Hepatic function panel    Lipid panel    TSH  3. Hyperlipidemia, mixed  W10.9 Basic metabolic panel    Hemoglobin A1c    Hepatic function panel    Lipid panel    TSH  4. Seasonal allergic rhinitis, unspecified trigger  N23.5 Basic metabolic panel    Hemoglobin A1c    Hepatic function panel    Lipid panel    TSH  5. Elevated blood sugar  T73.2 Basic metabolic panel    Hemoglobin A1c    Hepatic function panel    Lipid panel     TSH  6. Encounter for hepatitis C screening test for low risk patient  Z11.59 Hepatitis C antibody  7. Estrogen deficiency  E28.39 DG Bone Density  healthy   She is not diabetic but  Epic is iding her  as such  attempted correction in record  dexa order placed  Patient Care Team: Panosh, Standley Brooking, MD as PCP - General (Internal Medicine) Crista Luria, MD as Attending Physician (Dermatology) Dyke Maes, OD (Optometry)  There are no Patient Instructions on file for this visit.  Standley Brooking. Panosh M.D.

## 2018-11-21 LAB — HM MAMMOGRAPHY

## 2018-11-24 NOTE — Progress Notes (Signed)
[name] [place], [place] [address] Phone: [phone] Subjective:   :   Tina [name], am serving as a scribe for Dr. Hulan [name].  CC: Neck and back pain follow-up  [id number]  Tina [name] is a 73 y.o. female coming in with complaint of neck pain. Has been exercising outside. Feeling good but does have occasionally tightness and pain in cervical spine.      Past Medical History:  Diagnosis Date  . High triglycerides   . Hx of varicella    Past Surgical History:  Procedure Laterality Date  . CHOLECYSTECTOMY     Social History   Socioeconomic History  . Marital status: Divorced    Spouse name: Not on file  . Number of children: Not on file  . Years of education: Not on file  . Highest education level: Not on file  Occupational History  . Occupation: Professor    Employer: [employer]  . Financial resource strain: Not on file  . Food insecurity    Worry: Not on file    Inability: Not on file  . Transportation needs    Medical: Not on file    Non-medical: Not on file  Tobacco Use  . Smoking status: Former Smoker    Packs/day: 0.70    Years: 4.00    Pack years: 2.80  . Smokeless tobacco: Never Used  . Tobacco comment: in her 72's   Substance and Sexual Activity  . Alcohol use: Yes    Alcohol/week: 1.0 standard drinks    Types: 1 Standard drinks or equivalent per week    Comment: ocassionally  . Drug use: [name]  . Sexual activity: Not on file  Lifestyle  . Physical activity    Days per week: Not on file    Minutes per session: Not on file  . Stress: Not on file  Relationships  . Social Herbalist on phone: Not on file    Gets together: Not on file    Attends religious service: Not on file    Active member of club or organization: Not on file    Attends meetings of clubs or organizations: Not on file    Relationship status: Not on file  Other Topics Concern  . Not on file   Social History Narrative    Now hhof 1 [name] pets    .    Mother  Moved down to friends home        Divorced one chid and grandchild   Neg ets firearms.    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    Retire this summer 16   Educ [place] leave.  This semester.    Exercises regularly      Neg tad    [name] Known Allergies Family History  Problem Relation Age of Onset  . Hypertension Mother   . Hyperlipidemia Mother   . Stroke Mother   . Diabetes Father   . Miscarriages / [place] Brother       Current Outpatient Medications (Respiratory):  .  cetirizine (ZYRTEC) 10 MG tablet, Take 10 mg by mouth daily. Marland Kitchen  loratadine (CLARITIN) 10 MG tablet, Take 10 mg by mouth daily.    Current Outpatient Medications (Other):  .  cholecalciferol (VITAMIN D) 1000 units tablet, Take 1,000 Units by mouth daily. .  famotidine (PEPCID) 20 MG tablet, Take 20 mg  by mouth 2 (two) times daily. .  MULTIPLE VITAMIN PO, Take by mouth.   .  Omega-3 Fatty Acids (OMEGA 3 PO), Take by mouth.   .  TURMERIC PO, Take by mouth.    Past medical history, social, surgical and family history all reviewed in electronic medical record.  [name] pertanent information unless stated regarding to the chief complaint.   Review of Systems:  [name] headache, visual changes, nausea, vomiting, diarrhea, constipation, dizziness, abdominal pain, skin rash, fevers, chills, night sweats, weight loss, swollen lymph nodes, body aches, joint swelling, muscle aches, chest pain, shortness of breath, mood changes.   Objective  Blood pressure 118/80, pulse 65, height 5\' 4"  (1.626 [name]), weight 121 lb (54.9 kg), SpO2 99 %. S   General: [name] apparent distress alert and oriented x3 mood and affect normal, dressed appropriately.  HEENT: Pupils equal, extraocular movements intact  Respiratory: Patient's speak in full sentences and does not appear short of breath  Cardiovascular: [name] lower extremity edema, non tender, [name] erythema  Skin:  Warm dry intact with [name] signs of infection or rash on extremities or on axial skeleton.  Abdomen: Soft nontender  Neuro: Cranial nerves II through XII are intact, neurovascularly intact in all extremities with 2+ DTRs and 2+ pulses.  Lymph: [name] lymphadenopathy of posterior or anterior cervical chain or axillae bilaterally.  Gait normal with good balance and coordination.  MSK:  Non tender with full range of motion and good stability and symmetric strength and tone of shoulders, elbows, wrist, hip, knee and ankles bilaterally.  Neck: Inspection mild loss of lordosis. [name] palpable stepoffs. Negative Spurling's maneuver. Mild limitation in rotation to the left Grip strength and sensation normal in bilateral hands Strength good C4 to T1 distribution [name] sensory change to C4 to T1 Negative Hoffman sign bilaterally Reflexes normal Tightness noted of the left trapezius  Osteopathic findings C4 flexed rotated and side bent left C6 flexed rotated and side bent left T3 extended rotated and side bent left inhaled third rib T9 extended rotated and side bent left     Impression and Recommendations:     This case required medical decision making of moderate complexity. The above documentation has been reviewed and is accurate and complete Tina [name] , DO       Note: This dictation was prepared with Dragon dictation along with smaller phrase technology. Any transcriptional errors that result from this process are unintentional.

## 2018-11-25 ENCOUNTER — Other Ambulatory Visit: Payer: Self-pay

## 2018-11-25 ENCOUNTER — Encounter: Payer: Self-pay | Admitting: Family Medicine

## 2018-11-25 ENCOUNTER — Ambulatory Visit (INDEPENDENT_AMBULATORY_CARE_PROVIDER_SITE_OTHER): Payer: Medicare Other | Admitting: Family Medicine

## 2018-11-25 VITALS — BP 118/80 | HR 65 | Ht 64.0 in | Wt 121.0 lb

## 2018-11-25 DIAGNOSIS — M9901 Segmental and somatic dysfunction of cervical region: Secondary | ICD-10-CM | POA: Diagnosis not present

## 2018-11-25 DIAGNOSIS — M542 Cervicalgia: Secondary | ICD-10-CM | POA: Diagnosis not present

## 2018-11-25 DIAGNOSIS — M999 Biomechanical lesion, unspecified: Secondary | ICD-10-CM

## 2018-11-25 DIAGNOSIS — M9902 Segmental and somatic dysfunction of thoracic region: Secondary | ICD-10-CM | POA: Diagnosis not present

## 2018-11-25 DIAGNOSIS — M9908 Segmental and somatic dysfunction of rib cage: Secondary | ICD-10-CM | POA: Diagnosis not present

## 2018-11-25 NOTE — Assessment & Plan Note (Signed)
Continues to be stable.  Discussed with patient in great length.  Discussed home exercises, icing regimen, which activities of doing which wants to avoid.  Patient is to increase activity slowly over the course the next several days.  Follow-up with me again in 4 to 8 weeks.

## 2018-11-25 NOTE — Patient Instructions (Signed)
See me again in 5-6 weeks Drink one extra cup of water before exercise Stretch more afterwards

## 2018-11-25 NOTE — Assessment & Plan Note (Signed)
Decision today to treat with OMT was based on Physical Exam  After verbal consent patient was treated with HVLA, ME, FPR techniques in cervical, thoracic, rib areas  Patient tolerated the procedure well with improvement in symptoms  Patient given exercises, stretches and lifestyle modifications  See medications in patient instructions if given  Patient will follow up in 8 weeks 

## 2018-11-26 ENCOUNTER — Encounter: Payer: Self-pay | Admitting: Internal Medicine

## 2019-01-04 NOTE — Progress Notes (Signed)
Tawana ScaleZach Ermie Glendenning D.O. [facility] [address] [place], [place] [address] Phone: [phone] Subjective:   [name], Valerie Wolf, am serving as a scribe for Dr. Antoine PrimasZachary Jeriel Vivanco.    CC: Neck and back pain follow-up  BJY:[id number]:Subjective  Tina [name] is a 73 y.o. female coming in with complaint of left sided neck pain for past 3 weeks. Patient has been writing post cards for voting but otherwise has not been doing anything differently. Denies any radiating symptoms. Does have pain up into her head. Can have sharp pain over spine and at base of skull. Has not been able to sleep. Has been using mm relaxer for relief.      Past Medical History:  Diagnosis Date  . High triglycerides   . Hx of varicella    Past Surgical History:  Procedure Laterality Date  . CHOLECYSTECTOMY     Social History   Socioeconomic History  . Marital status: Divorced    Spouse name: Not on file  . Number of children: Not on file  . Years of education: Not on file  . Highest education level: Not on file  Occupational History  . Occupation: Professor    Employer: [employer]  Social Needs  . Financial resource strain: Not on file  . Food insecurity    Worry: Not on file    Inability: Not on file  . Transportation needs    Medical: Not on file    Non-medical: Not on file  Tobacco Use  . Smoking status: Former Smoker    Packs/day: 0.70    Years: 4.00    Pack years: 2.80  . Smokeless tobacco: Never Used  . Tobacco comment: in her 3320's   Substance and Sexual Activity  . Alcohol use: Yes    Alcohol/week: 1.0 standard drinks    Types: 1 Standard drinks or equivalent per week    Comment: ocassionally  . Drug use: No  . Sexual activity: Not on file  Lifestyle  . Physical activity    Days per week: Not on file    Minutes per session: Not on file  . Stress: Not on file  Relationships  . Social Musicianconnections    Talks on phone: Not on file    Gets together: Not on file    Attends religious service:  Not on file    Active member of club or organization: Not on file    Attends meetings of clubs or organizations: Not on file    Relationship status: Not on file  Other Topics Concern  . 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    Mother  Moved down to friends home        Divorced one chid and grandchild   Neg ets firearms.    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    Exercises regularly      Neg tad    No Known Allergies Family History  Problem Relation Age of Onset  . Hypertension Mother   . Hyperlipidemia Mother   . Stroke Mother   . Diabetes Father   . Miscarriages / [place]       Current Outpatient Medications (Respiratory):  .  cetirizine (ZYRTEC) 10 MG tablet, Take 10 mg by mouth daily. Marland Kitchen.  loratadine (CLARITIN) 10  MG tablet, Take 10 mg by mouth daily.    Current Outpatient Medications (Other):  .  cholecalciferol (VITAMIN D) 1000 units tablet, Take 1,000 Units by mouth daily. .  famotidine (PEPCID) 20 MG tablet, Take 20 mg by mouth 2 (two) times daily. .  MULTIPLE VITAMIN PO, Take by mouth.   .  Omega-3 Fatty Acids (OMEGA 3 PO), Take by mouth.   .  TURMERIC PO, Take by mouth. Marland Kitchen  tiZANidine (ZANAFLEX) 4 MG tablet, Take 1 tablet (4 mg total) by mouth at bedtime.    Past medical history, social, surgical and family history all reviewed in electronic medical record.  No pertanent information unless stated regarding to the chief complaint.   Review of Systems:  No headache, visual changes, nausea, vomiting, diarrhea, constipation, dizziness, abdominal pain, skin rash, fevers, chills, night sweats, weight loss, swollen lymph nodes, body aches, joint swelling, muscle aches, chest pain, shortness of breath, mood changes.   Objective  Blood pressure 128/82, pulse 82, height 5\' 4"  (1.626 m), weight 121 lb (54.9 kg), SpO2 98 %.    General: No apparent  distress alert and oriented x3 mood and affect normal, dressed appropriately.  HEENT: Pupils equal, extraocular movements intact  Respiratory: Patient's speak in full sentences and does not appear short of breath  Cardiovascular: No lower extremity edema, non tender, no erythema  Skin: Warm dry intact with no signs of infection or rash on extremities or on axial skeleton.  Abdomen: Soft nontender  Neuro: Cranial nerves II through XII are intact, neurovascularly intact in all extremities with 2+ DTRs and 2+ pulses.  Lymph: No lymphadenopathy of posterior or anterior cervical chain or axillae bilaterally.  Gait normal with good balance and coordination.  MSK:  Non tender with full range of motion and good stability and symmetric strength and tone of shoulders, elbows, wrist, hip, knee and ankles bilaterally.  Neck: Inspection loss of lordosis. No palpable stepoffs. Negative Spurling's maneuver. Limited range of motion lacking last 5 degrees of extension and sidebending bilaterally more tenderness at the occipital region Grip strength and sensation normal in bilateral hands Strength good C4 to T1 distribution No sensory change to C4 to T1 Negative Hoffman sign bilaterally Reflexes normal  Osteopathic findings C2 flexed rotated and side bent right C6 flexed rotated and side bent left T3 extended rotated and side bent right inhaled third rib T6 extended rotated and side bent left     Impression and Recommendations:     This case required medical decision making of moderate complexity. The above documentation has been reviewed and is accurate and complete Lyndal Pulley, DO       Note: This dictation was prepared with Dragon dictation along with smaller phrase technology. Any transcriptional errors that result from this process are unintentional.

## 2019-01-04 NOTE — Assessment & Plan Note (Signed)
Decision today to treat with OMT was based on Physical Exam  After verbal consent patient was treated with HVLA, ME, FPR techniques in cervical, thoracic, rib areas  Patient tolerated the procedure well with improvement in symptoms  Patient given exercises, stretches and lifestyle modifications  See medications in patient instructions if given  Patient will follow up in 4-8 weeks 

## 2019-01-04 NOTE — Assessment & Plan Note (Signed)
Neck pain left side.  Has been doing relatively well with osteopathic manipulation.  Continue to work on Engineer, building services.  Patient will increase activity slowly.  Follow-up again 4 to 8 weeks

## 2019-01-06 ENCOUNTER — Encounter: Payer: Self-pay | Admitting: Family Medicine

## 2019-01-06 ENCOUNTER — Ambulatory Visit (INDEPENDENT_AMBULATORY_CARE_PROVIDER_SITE_OTHER): Payer: Medicare Other | Admitting: Family Medicine

## 2019-01-06 VITALS — BP 128/82 | HR 82 | Ht 64.0 in | Wt 121.0 lb

## 2019-01-06 DIAGNOSIS — M9901 Segmental and somatic dysfunction of cervical region: Secondary | ICD-10-CM | POA: Diagnosis not present

## 2019-01-06 DIAGNOSIS — M9908 Segmental and somatic dysfunction of rib cage: Secondary | ICD-10-CM | POA: Diagnosis not present

## 2019-01-06 DIAGNOSIS — M999 Biomechanical lesion, unspecified: Secondary | ICD-10-CM

## 2019-01-06 DIAGNOSIS — M542 Cervicalgia: Secondary | ICD-10-CM

## 2019-01-06 DIAGNOSIS — M9902 Segmental and somatic dysfunction of thoracic region: Secondary | ICD-10-CM

## 2019-01-06 MED ORDER — TIZANIDINE HCL 4 MG PO TABS: 4.0000 mg | ORAL_TABLET | Freq: Every day | ORAL | 0 refills | Status: DC

## 2019-01-06 NOTE — Patient Instructions (Signed)
Good to see you  Keep using the tennis balls Heat 10 minutes, ice 10 minutes 3 times a day with massage.  Try to change where your head is with the post cards  See me again in 3-5 weeks

## 2019-01-26 NOTE — Progress Notes (Signed)
[name] [place], [place] [address] Phone: [phone] Subjective:    I'm seeing this patient by the request  of:    CC: Neck pain follow-up  [id number]    I, Wendy Poet, LAT, ATC, am serving as scribe for Dr. Hulan Saas.  01/06/19: Neck pain left side.  Has been doing relatively well with osteopathic manipulation.  Continue to work on Engineer, building services.  Patient will increase activity slowly.  Follow-up again 4 to 8 weeks  Update- 01/27/19: Tina [name] is a 73 y.o. female coming in with complaint of neck pain.  Pt states that she is feeling much better.  She states that she has been doing what was recommended.  She is not taking the Zanaflex due to how well she's been feeling. Overall has shown some improvement.      Past Medical History:  Diagnosis Date  . High triglycerides   . Hx of varicella    Past Surgical History:  Procedure Laterality Date  . CHOLECYSTECTOMY     Social History   Socioeconomic History  . Marital status: Divorced    Spouse name: Not on file  . Number of children: Not on file  . Years of education: Not on file  . Highest education level: Not on file  Occupational History  . Occupation: Professor    Employer: [employer]  . Financial resource strain: Not on file  . Food insecurity    Worry: Not on file    Inability: Not on file  . Transportation needs    Medical: Not on file    Non-medical: Not on file  Tobacco Use  . Smoking status: Former Smoker    Packs/day: 0.70    Years: 4.00    Pack years: 2.80  . Smokeless tobacco: Never Used  . Tobacco comment: in her 21's   Substance and Sexual Activity  . Alcohol use: Yes    Alcohol/week: 1.0 standard drinks    Types: 1 Standard drinks or equivalent per week    Comment: ocassionally  . Drug use: No  . Sexual activity: Not on file  Lifestyle  . Physical activity    Days per week: Not on file    Minutes per  session: Not on file  . Stress: Not on file  Relationships  . Social Herbalist on phone: Not on file    Gets together: Not on file    Attends religious service: Not on file    Active member of club or organization: Not on file    Attends meetings of clubs or organizations: Not on file    Relationship status: Not on file  Other Topics Concern  . 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    Mother  Moved down to friends home        Divorced one chid and grandchild   Neg ets firearms.    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  This semester.    Exercises regularly      Neg tad    No Known Allergies Family History  Problem Relation Age of Onset  . Hypertension Mother   . Hyperlipidemia Mother   . Stroke Mother   . Diabetes Father   . Miscarriages /  Stillbirths Brother       Current Outpatient Medications (Respiratory):  .  cetirizine (ZYRTEC) 10 MG tablet, Take 10 mg by mouth daily. Marland Kitchen  loratadine (CLARITIN) 10 MG tablet, Take 10 mg by mouth daily.    Current Outpatient Medications (Other):  .  cholecalciferol (VITAMIN D) 1000 units tablet, Take 1,000 Units by mouth daily. .  famotidine (PEPCID) 20 MG tablet, Take 20 mg by mouth 2 (two) times daily. .  MULTIPLE VITAMIN PO, Take by mouth.   .  Omega-3 Fatty Acids (OMEGA 3 PO), Take by mouth.   .  TURMERIC PO, Take by mouth. Marland Kitchen  tiZANidine (ZANAFLEX) 4 MG tablet, Take 1 tablet (4 mg total) by mouth at bedtime. (Patient not taking: Reported on 01/27/2019)    Past medical history, social, surgical and family history all reviewed in electronic medical record.  No pertanent information unless stated regarding to the chief complaint.   Review of Systems:  No headache, visual changes, nausea, vomiting, diarrhea, constipation, dizziness, abdominal pain, skin rash, fevers, chills, night sweats, weight loss, swollen lymph nodes, body  aches, joint swelling, muscle aches, chest pain, shortness of breath, mood changes.   Objective  Blood pressure 132/78, pulse 71, height 5\' 4"  (1.626 m), weight 123 lb 3.2 oz (55.9 kg), SpO2 98 %.    General: No apparent distress alert and oriented x3 mood and affect normal, dressed appropriately.  HEENT: Pupils equal, extraocular movements intact  Respiratory: Patient's speak in full sentences and does not appear short of breath  Cardiovascular: No lower extremity edema, non tender, no erythema  Skin: Warm dry intact with no signs of infection or rash on extremities or on axial skeleton.  Abdomen: Soft nontender  Neuro: Cranial nerves II through XII are intact, neurovascularly intact in all extremities with 2+ DTRs and 2+ pulses.  Lymph: No lymphadenopathy of posterior or anterior cervical chain or axillae bilaterally.  Gait normal with good balance and coordination.  MSK:  Non tender with full range of motion and good stability and symmetric strength and tone of shoulders, elbows, wrist, hip, knee and ankles bilaterally.  Neck: Inspection mild loss of lordosis. No palpable stepoffs. Negative Spurling's maneuver. Full neck range of motion Grip strength and sensation normal in bilateral hands Strength good C4 to T1 distribution No sensory change to C4 to T1 Negative Hoffman sign bilaterally Reflexes normal Tightness of the trapezius bilaterally right greater than left  Osteopathic findings C2 flexed rotated and side bent right C6 flexed rotated and side bent left T3 extended rotated and side bent right inhaled third rib T6 extended rotated and side bent left     Impression and Recommendations:     This case required medical decision making of moderate complexity. The above documentation has been reviewed and is accurate and complete , DO       Note: This dictation was prepared with Dragon dictation along with smaller phrase technology. Any transcriptional  errors that result from this process are unintentional.

## 2019-01-26 NOTE — Assessment & Plan Note (Signed)
Stable but chronic.  Discussed with patient in great length.  Discussed which activities to do which wants to avoid.  Increase activity as tolerated.  Patient will continue with the ergonomics and home exercises no significant change in management, follow-up again in 6 weeks

## 2019-01-26 NOTE — Assessment & Plan Note (Signed)
Decision today to treat with OMT was based on Physical Exam  After verbal consent patient was treated with HVLA, ME, FPR techniques in cervical, thoracic, rib areas  Patient tolerated the procedure well with improvement in symptoms  Patient given exercises, stretches and lifestyle modifications  See medications in patient instructions if given  Patient will follow up in 4-8 weeks 

## 2019-01-27 ENCOUNTER — Ambulatory Visit (INDEPENDENT_AMBULATORY_CARE_PROVIDER_SITE_OTHER): Payer: Medicare Other | Admitting: Family Medicine

## 2019-01-27 ENCOUNTER — Encounter: Payer: Self-pay | Admitting: Family Medicine

## 2019-01-27 ENCOUNTER — Other Ambulatory Visit: Payer: Self-pay

## 2019-01-27 VITALS — BP 132/78 | HR 71 | Ht 64.0 in | Wt 123.2 lb

## 2019-01-27 DIAGNOSIS — M999 Biomechanical lesion, unspecified: Secondary | ICD-10-CM | POA: Diagnosis not present

## 2019-01-27 DIAGNOSIS — M9901 Segmental and somatic dysfunction of cervical region: Secondary | ICD-10-CM | POA: Diagnosis not present

## 2019-01-27 DIAGNOSIS — M542 Cervicalgia: Secondary | ICD-10-CM | POA: Diagnosis not present

## 2019-01-27 DIAGNOSIS — M9908 Segmental and somatic dysfunction of rib cage: Secondary | ICD-10-CM

## 2019-01-27 DIAGNOSIS — M9902 Segmental and somatic dysfunction of thoracic region: Secondary | ICD-10-CM

## 2019-01-27 NOTE — Patient Instructions (Signed)
Great to see you  You will do wonderful  VOTE  See me again in 6 weeks

## 2019-01-28 ENCOUNTER — Other Ambulatory Visit: Payer: Self-pay | Admitting: Family Medicine

## 2019-02-24 ENCOUNTER — Other Ambulatory Visit: Payer: Self-pay | Admitting: Family Medicine

## 2019-03-05 ENCOUNTER — Other Ambulatory Visit: Payer: Self-pay

## 2019-03-05 ENCOUNTER — Encounter: Payer: Self-pay | Admitting: Family Medicine

## 2019-03-05 ENCOUNTER — Ambulatory Visit (INDEPENDENT_AMBULATORY_CARE_PROVIDER_SITE_OTHER): Payer: Medicare Other | Admitting: Family Medicine

## 2019-03-05 VITALS — BP 110/70 | HR 76 | Ht 64.0 in | Wt 123.0 lb

## 2019-03-05 DIAGNOSIS — M542 Cervicalgia: Secondary | ICD-10-CM

## 2019-03-05 DIAGNOSIS — M999 Biomechanical lesion, unspecified: Secondary | ICD-10-CM

## 2019-03-05 DIAGNOSIS — M9981 Other biomechanical lesions of cervical region: Secondary | ICD-10-CM

## 2019-03-05 MED ORDER — VITAMIN D (ERGOCALCIFEROL) 1.25 MG (50000 UNIT) PO CAPS: 50000.0000 [IU] | ORAL_CAPSULE | ORAL | 0 refills | Status: DC

## 2019-03-05 NOTE — Assessment & Plan Note (Signed)
Decision today to treat with OMT was based on Physical Exam  After verbal consent patient was treated with HVLA, ME, FPR techniques in cervical, thoracic, rib areas  Patient tolerated the procedure well with improvement in symptoms  Patient given exercises, stretches and lifestyle modifications  See medications in patient instructions if given  Patient will follow up in 4-8 weeks 

## 2019-03-05 NOTE — Patient Instructions (Signed)
Once weekly vitamin d Arnica lotion for the toes See me again in 5 weeks

## 2019-03-05 NOTE — Assessment & Plan Note (Signed)
Continue mild chronic left-sided pain.  Discussed icing regimen and home exercise, discussed which activities to do which wants to avoid.  Increase activity as tolerated.  Follow-up again in 4 to 8 weeks.

## 2019-03-05 NOTE — Progress Notes (Signed)
[name] [address] [place], [place] [address] Phone: [phone] Subjective:   I Tina [name] am serving as a Neurosurgeon for Dr. Antoine Primas. :    CC: Neck pain and back pain follow-up  [id number]  Tina [name] is a 73 y.o. female coming in with complaint of back pain. Last seen on 01/27/2019 for OMT. Patient states she hit her toes on furniture and would like to take a look at them. Some pain with walking. Injury happened Monday. States she wrapped her toes. Walking without shoes is uncomfortable. Toes are bruised.        Past Medical History:  Diagnosis Date  . High triglycerides   . Hx of varicella    Past Surgical History:  Procedure Laterality Date  . CHOLECYSTECTOMY     Social History   Socioeconomic History  . Marital status: Divorced    Spouse name: Not on file  . Number of children: Not on file  . Years of education: Not on file  . Highest education level: Not on file  Occupational History  . Occupation: Professor    Employer: [employer]  Social Needs  . Financial resource strain: Not on file  . Food insecurity    Worry: Not on file    Inability: Not on file  . Transportation needs    Medical: Not on file    Non-medical: Not on file  Tobacco Use  . Smoking status: Former Smoker    Packs/day: 0.70    Years: 4.00    Pack years: 2.80  . Smokeless tobacco: Never Used  . Tobacco comment: in her 40's   Substance and Sexual Activity  . Alcohol use: Yes    Alcohol/week: 1.0 standard drinks    Types: 1 Standard drinks or equivalent per week    Comment: ocassionally  . Drug use: No  . Sexual activity: Not on file  Lifestyle  . Physical activity    Days per week: Not on file    Minutes per session: Not on file  . Stress: Not on file  Relationships  . Social Musician on phone: Not on file    Gets together: Not on file    Attends religious service: Not on file    Active member of club or organization:  Not on file    Attends meetings of clubs or organizations: Not on file    Relationship status: Not on file  Other Topics Concern  . 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    Mother  Moved down to friends home        Divorced one chid and grandchild   Neg ets firearms.    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    Exercises regularly      Neg tad    No Known Allergies Family History  Problem Relation Age of Onset  . Hypertension Mother   . Hyperlipidemia Mother   . Stroke Mother   . Diabetes Father   . Miscarriages / [place] Brother       Current Outpatient Medications (Respiratory):  .  cetirizine (ZYRTEC) 10 MG tablet, Take 10 mg by mouth daily. Marland Kitchen  loratadine (CLARITIN) 10 MG tablet, Take 10 mg by mouth daily.  Current Outpatient Medications (Other):  .  cholecalciferol (VITAMIN D) 1000 units tablet, Take 1,000 Units by mouth daily. .  famotidine (PEPCID) 20 MG tablet, Take 20 mg by mouth 2 (two) times daily. .  MULTIPLE VITAMIN PO, Take by mouth.   .  Omega-3 Fatty Acids (OMEGA 3 PO), Take by mouth.   Marland Kitchen  tiZANidine (ZANAFLEX) 4 MG tablet, TAKE 1 TABLET (4 MG TOTAL) BY MOUTH AT BEDTIME. Marland Kitchen  TURMERIC PO, Take by mouth.    Past medical history, social, surgical and family history all reviewed in electronic medical record.  No pertanent information unless stated regarding to the chief complaint.   Review of Systems:  No headache, visual changes, nausea, vomiting, diarrhea, constipation, dizziness, abdominal pain, skin rash, fevers, chills, night sweats, weight loss, swollen lymph nodes, body aches, joint swelling, muscle aches, chest pain, shortness of breath, mood changes.   Objective  There were no vitals taken for this visit.    General: No apparent distress alert and oriented x3 mood and affect normal, dressed appropriately.  HEENT: Pupils equal,  extraocular movements intact  Respiratory: Patient's speak in full sentences and does not appear short of breath  Cardiovascular: No lower extremity edema, non tender, no erythema  Skin: Warm dry intact with no signs of infection or rash on extremities or on axial skeleton.  Abdomen: Soft nontender  Neuro: Cranial nerves II through XII are intact, neurovascularly intact in all extremities with 2+ DTRs and 2+ pulses.  Lymph: No lymphadenopathy of posterior or anterior cervical chain or axillae bilaterally.  Gait normal with good balance and coordination.  MSK:  Non tender with full range of motion and good stability and symmetric strength and tone of shoulders, elbows, wrist, hip, knee and ankles bilaterally.    Left foot exam shows the patient does have some bruising noted on the dorsal aspect of the foot.  Patient does have some very mild malalignment of the DIP of the fourth toe.  Tender to palpation in the area.  No pain over the dorsal aspect of the foot though.  Neck exam shows the patient does have some ptosis, some parascapular tightness noted.  Patient does not though have any true scapular dyskinesis.  Osteopathic findings  C4 flexed rotated and side bent left C6 flexed rotated and side bent left T3 extended rotated and side bent right inhaled third rib T7 extended rotated and side bent left     Impression and Recommendations:     This case required medical decision making of moderate complexity. The above documentation has been reviewed and is accurate and complete Lyndal Pulley, DO       Note: This dictation was prepared with Dragon dictation along with smaller phrase technology. Any transcriptional errors that result from this process are unintentional.

## 2019-03-20 ENCOUNTER — Other Ambulatory Visit: Payer: Self-pay | Admitting: Family Medicine

## 2019-04-01 ENCOUNTER — Other Ambulatory Visit: Payer: Self-pay | Admitting: Family Medicine

## 2019-04-07 ENCOUNTER — Ambulatory Visit: Payer: Medicare Other | Admitting: Family Medicine

## 2019-04-14 ENCOUNTER — Encounter: Payer: Self-pay | Admitting: Family Medicine

## 2019-04-14 ENCOUNTER — Other Ambulatory Visit: Payer: Self-pay

## 2019-04-14 ENCOUNTER — Ambulatory Visit (INDEPENDENT_AMBULATORY_CARE_PROVIDER_SITE_OTHER): Payer: Medicare Other | Admitting: Family Medicine

## 2019-04-14 VITALS — BP 122/84 | HR 72 | Ht 64.0 in | Wt 127.0 lb

## 2019-04-14 DIAGNOSIS — M9988 Other biomechanical lesions of rib cage: Secondary | ICD-10-CM

## 2019-04-14 DIAGNOSIS — M9982 Other biomechanical lesions of thoracic region: Secondary | ICD-10-CM | POA: Diagnosis not present

## 2019-04-14 DIAGNOSIS — M999 Biomechanical lesion, unspecified: Secondary | ICD-10-CM

## 2019-04-14 DIAGNOSIS — M9981 Other biomechanical lesions of cervical region: Secondary | ICD-10-CM

## 2019-04-14 DIAGNOSIS — M542 Cervicalgia: Secondary | ICD-10-CM

## 2019-04-14 NOTE — Assessment & Plan Note (Signed)
Decision today to treat with OMT was based on Physical Exam  After verbal consent patient was treated with HVLA, ME, FPR techniques in cervical, thoracic, rib, areas  Patient tolerated the procedure well with improvement in symptoms  Patient given exercises, stretches and lifestyle modifications  See medications in patient instructions if given  Patient will follow up in 6 weeks

## 2019-04-14 NOTE — Patient Instructions (Addendum)
Happy Holidays [place] See me again in 6 weeks

## 2019-04-14 NOTE — Progress Notes (Signed)
[name] [address] [place], [place] [address] Phone: [phone] Subjective:   Tina [name], am serving as a scribe for Dr. Antoine Primas. This visit occurred during the SARS-CoV-2 public health emergency.  Safety protocols were in place, including screening questions prior to the visit, additional usage of staff PPE, and extensive cleaning of exam room while observing appropriate contact time as indicated for disinfecting solutions.     CC: Neck and back pain follow-up  [id number]  Tina [name] is a 73 y.o. female coming in with complaint of neck and back pain. Last seen on 03/05/2019. Patient is having some stiffness.  No radiation of pain, numbness or tingling going down the arm.  Patient feels more stiffness.  Has been doing home exercises and doing some yoga but he feels like he needs to be a little bit more aggressive.      Past Medical History:  Diagnosis Date  . High triglycerides   . Hx of varicella    Past Surgical History:  Procedure Laterality Date  . CHOLECYSTECTOMY     Social History   Socioeconomic History  . Marital status: Divorced    Spouse name: Not on file  . Number of children: Not on file  . Years of education: Not on file  . Highest education level: Not on file  Occupational History  . Occupation: Professor    Employer: [employer]  Tobacco Use  . Smoking status: Former Smoker    Packs/day: 0.70    Years: 4.00    Pack years: 2.80  . Smokeless tobacco: Never Used  . Tobacco comment: in her 70's   Substance and Sexual Activity  . Alcohol use: Yes    Alcohol/week: 1.0 standard drinks    Types: 1 Standard drinks or equivalent per week    Comment: ocassionally  . Drug use: No  . Sexual activity: Not on file  Other Topics Concern  . 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    Mother  Moved down to friends home        Divorced one chid and grandchild   Neg ets firearms.    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    Exercises regularly      Neg tad    Social Determinants of Health   Financial Resource Strain:   . Difficulty of Paying Living Expenses: Not on file  Food Insecurity:   . Worried About Programme researcher, broadcasting/film/video in the Last Year: Not on file  . Ran Out of Food in the Last Year: Not on file  Transportation Needs:   . Lack of Transportation (Medical): Not on file  . Lack of Transportation (Non-Medical): Not on file  Physical Activity:   . Days of Exercise per Week: Not on file  . Minutes of Exercise per Session: Not on file  Stress:   . Feeling of Stress : Not on file  Social Connections:   . Frequency of Communication with Friends and Family: Not on file  . Frequency of Social Gatherings with Friends and Family: Not on file  . Attends Religious Services: Not on file  . Active Member of Clubs or Organizations: Not on file  . Attends Banker Meetings: Not on file  .  Marital Status: Not on file   No Known Allergies Family History  Problem Relation Age of Onset  . Hypertension Mother   . Hyperlipidemia Mother   . Stroke Mother   . Diabetes Father   . Miscarriages / [place] Brother       Current Outpatient Medications (Respiratory):  .  cetirizine (ZYRTEC) 10 MG tablet, Take 10 mg by mouth daily. Marland Kitchen  loratadine (CLARITIN) 10 MG tablet, Take 10 mg by mouth daily.    Current Outpatient Medications (Other):  .  cholecalciferol (VITAMIN D) 1000 units tablet, Take 1,000 Units by mouth daily. .  famotidine (PEPCID) 20 MG tablet, Take 20 mg by mouth 2 (two) times daily. .  MULTIPLE VITAMIN PO, Take by mouth.   .  Omega-3 Fatty Acids (OMEGA 3 PO), Take by mouth.   Marland Kitchen  tiZANidine (ZANAFLEX) 4 MG tablet, TAKE 1 TABLET (4 MG TOTAL) BY MOUTH AT BEDTIME. Marland Kitchen  TURMERIC PO, Take by mouth. .  Vitamin D, Ergocalciferol, (DRISDOL) 1.25 MG (50000 UT) CAPS  capsule, Take 1 capsule (50,000 Units total) by mouth every 7 (seven) days.    Past medical history, social, surgical and family history all reviewed in electronic medical record.  No pertanent information unless stated regarding to the chief complaint.   Review of Systems:  No headache, visual changes, nausea, vomiting, diarrhea, constipation, dizziness, abdominal pain, skin rash, fevers, chills, night sweats, weight loss, swollen lymph nodes, body aches, joint swelling, muscle aches, chest pain, shortness of breath, mood changes.   Objective  There were no vitals taken for this visit.    General: No apparent distress alert and oriented x3 mood and affect normal, dressed appropriately.  HEENT: Pupils equal, extraocular movements intact  Respiratory: Patient's speak in full sentences and does not appear short of breath  Cardiovascular: No lower extremity edema, non tender, no erythema  Skin: Warm dry intact with no signs of infection or rash on extremities or on axial skeleton.  Abdomen: Soft nontender  Neuro: Cranial nerves II through XII are intact, neurovascularly intact in all extremities with 2+ DTRs and 2+ pulses.  Lymph: No lymphadenopathy of posterior or anterior cervical chain or axillae bilaterally.  Gait normal with good balance and coordination.  MSK:  Non tender with full range of motion and good stability and symmetric strength and tone of shoulders, elbows, wrist, hip, knee and ankles bilaterally.  Neck: Inspection mild loss of lordosis. No palpable stepoffs. Negative Spurling's maneuver. Mild limitation in sidebending and extension of the neck Grip strength and sensation normal in bilateral hands Strength good C4 to T1 distribution No sensory change to C4 to T1 Negative Hoffman sign bilaterally Reflexes normal Tightness of the trapezius bilaterally right greater than left   Osteopathic findings  C2 flexed rotated and side bent right C4 flexed rotated and side  bent left C6 flexed rotated and side bent left T3 extended rotated and side bent right inhaled third rib T9 extended rotated and side bent left    Impression and Recommendations:     This case required medical decision making of moderate complexity. The above documentation has been reviewed and is accurate and complete Lyndal Pulley, DO       Note: This dictation was prepared with Dragon dictation along with smaller phrase technology. Any transcriptional errors that result from this process are unintentional.

## 2019-04-14 NOTE — Assessment & Plan Note (Signed)
Multifactorial.  Has been doing well though with conservative therapy.  Does respond well to osteopathic manipulation, discussed ergonomics throughout the day that could be more helpful.  Patient will follow-up again in 4 to 8 weeks

## 2019-05-12 ENCOUNTER — Ambulatory Visit: Payer: Medicare PPO | Attending: Internal Medicine

## 2019-05-12 DIAGNOSIS — Z23 Encounter for immunization: Secondary | ICD-10-CM | POA: Insufficient documentation

## 2019-05-12 NOTE — Progress Notes (Signed)
   Covid-19 Vaccination Clinic  Name:  Tina [name]    MRN: [id number] DOB: [birth year]  05/12/2019  [name] was observed post Covid-19 immunization for 15 minutes without incidence. She was provided with Vaccine Information Sheet and instruction to access the V-Safe system.   [name] was instructed to call 911 with any severe reactions post vaccine: Marland Kitchen Difficulty breathing  . Swelling of your face and throat  . A fast heartbeat  . A bad rash all over your body  . Dizziness and weakness    Immunizations Administered    Name Date Dose VIS Date Route   Pfizer COVID-19 Vaccine 05/12/2019  4:40 PM 0.3 mL 04/03/2019 Intramuscular   Manufacturer: [employer], [employer]   Lot: [id number]   NDC: [address]-3

## 2019-05-21 ENCOUNTER — Other Ambulatory Visit: Payer: Self-pay | Admitting: Family Medicine

## 2019-05-28 ENCOUNTER — Ambulatory Visit: Payer: Medicare PPO | Admitting: Family Medicine

## 2019-05-28 ENCOUNTER — Encounter: Payer: Self-pay | Admitting: Family Medicine

## 2019-05-28 ENCOUNTER — Other Ambulatory Visit: Payer: Self-pay

## 2019-05-28 VITALS — BP 100/80 | HR 70 | Ht 64.0 in | Wt 125.0 lb

## 2019-05-28 DIAGNOSIS — M542 Cervicalgia: Secondary | ICD-10-CM

## 2019-05-28 DIAGNOSIS — M999 Biomechanical lesion, unspecified: Secondary | ICD-10-CM

## 2019-05-28 NOTE — Assessment & Plan Note (Signed)
Decision today to treat with OMT was based on Physical Exam chronic problem with exacerbation  After verbal consent patient was treated with HVLA, ME, FPR techniques in cervical, thoracic, rib areas  Patient tolerated the procedure well with improvement in symptoms  Patient given exercises, stretches and lifestyle modifications  See medications in patient instructions if given  Patient will follow up in 4-8 weeks

## 2019-05-28 NOTE — Progress Notes (Signed)
[name] [address] [place] [address] Phone: [phone] Subjective:   Tina [name], am serving as a scribe for Dr. Antoine Primas. This visit occurred during the SARS-CoV-2 public health emergency.  Safety protocols were in place, including screening questions prior to the visit, additional usage of staff PPE, and extensive cleaning of exam room while observing appropriate contact time as indicated for disinfecting solutions.   I'm seeing this patient by the request  of:  Panosh, Tina Mends, MD  CC: Neck pain follow-up  [id number]  Tina [name] is a 74 y.o. female coming in with complaint of back pain. Last seen on 04/14/2019 for OMT. Patient states her neck has been bothering her. Pain started last Sunday. States she was reading all day and believes that is what is causing the pain.  Patient denies any radiation down the arm.  States that it seems to stay more in the shoulder to the neck area.    Past Medical History:  Diagnosis Date  . High triglycerides   . Hx of varicella    Past Surgical History:  Procedure Laterality Date  . CHOLECYSTECTOMY     Social History   Socioeconomic History  . Marital status: Divorced    Spouse name: Not on file  . Number of children: Not on file  . Years of education: Not on file  . Highest education level: Not on file  Occupational History  . Occupation: Professor    Employer: [employer]  Tobacco Use  . Smoking status: Former Smoker    Packs/day: 0.70    Years: 4.00    Pack years: 2.80  . Smokeless tobacco: Never Used  . Tobacco comment: in her 65's   Substance and Sexual Activity  . Alcohol use: Yes    Alcohol/week: 1.0 standard drinks    Types: 1 Standard drinks or equivalent per week    Comment: ocassionally  . Drug use: No  . Sexual activity: Not on file  Other Topics Concern  . 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    Mother  Moved down to friends  home        Divorced one chid and grandchild   Neg ets firearms.    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    Exercises regularly      Neg tad    Social Determinants of Health   Financial Resource Strain:   . Difficulty of Paying Living Expenses: Not on file  Food Insecurity:   . Worried About Programme researcher, broadcasting/film/video in the Last Year: Not on file  . Ran Out of Food in the Last Year: Not on file  Transportation Needs:   . Lack of Transportation (Medical): Not on file  . Lack of Transportation (Non-Medical): Not on file  Physical Activity:   . Days of Exercise per Week: Not on file  . Minutes of Exercise per Session: Not on file  Stress:   . Feeling of Stress : Not on file  Social Connections:   . Frequency of Communication with Friends and Family: Not on file  . Frequency of Social Gatherings with Friends and Family: Not on file  . Attends Religious Services: Not on file  . Active Member of Clubs or Organizations: Not  on file  . Attends Archivist Meetings: Not on file  . Marital Status: Not on file   No Known Allergies Family History  Problem Relation Age of Onset  . Hypertension Mother   . Hyperlipidemia Mother   . Stroke Mother   . Diabetes Father   . Miscarriages / [place] Brother       Current Outpatient Medications (Respiratory):  .  cetirizine (ZYRTEC) 10 MG tablet, Take 10 mg by mouth daily. Marland Kitchen  loratadine (CLARITIN) 10 MG tablet, Take 10 mg by mouth daily.    Current Outpatient Medications (Other):  .  cholecalciferol (VITAMIN D) 1000 units tablet, Take 1,000 Units by mouth daily. .  famotidine (PEPCID) 20 MG tablet, Take 20 mg by mouth 2 (two) times daily. .  MULTIPLE VITAMIN PO, Take by mouth.   .  Omega-3 Fatty Acids (OMEGA 3 PO), Take by mouth.   Marland Kitchen  tiZANidine (ZANAFLEX) 4 MG tablet, TAKE 1 TABLET (4 MG TOTAL) BY MOUTH AT BEDTIME. Marland Kitchen  TURMERIC PO, Take by  mouth. .  Vitamin D, Ergocalciferol, (DRISDOL) 1.25 MG (50000 UNIT) CAPS capsule, TAKE 1 CAPSULE (50,000 UNITS TOTAL) BY MOUTH EVERY 7 (SEVEN) DAYS.   Reviewed prior external information including notes and imaging from  primary care provider As well as notes that were available from care everywhere and other healthcare systems.  Past medical history, social, surgical and family history all reviewed in electronic medical record.  No pertanent information unless stated regarding to the chief complaint.   Review of Systems:  No headache, visual changes, nausea, vomiting, diarrhea, constipation, dizziness, abdominal pain, skin rash, fevers, chills, night sweats, weight loss, swollen lymph nodes, body aches, joint swelling, chest pain, shortness of breath, mood changes. POSITIVE muscle aches  Objective  Blood pressure 100/80, pulse 70, height 5\' 4"  (1.626 m), weight 125 lb (56.7 kg), SpO2 98 %.   General: No apparent distress alert and oriented x3 mood and affect normal, dressed appropriately.  HEENT: Pupils equal, extraocular movements intact  Respiratory: Patient's speak in full sentences and does not appear short of breath  Cardiovascular: No lower extremity edema, non tender, no erythema  Skin: Warm dry intact with no signs of infection or rash on extremities or on axial skeleton.  Abdomen: Soft nontender  Neuro: Cranial nerves II through XII are intact, neurovascularly intact in all extremities with 2+ DTRs and 2+ pulses.  Lymph: No lymphadenopathy of posterior or anterior cervical chain or axillae bilaterally.  Gait normal with good balance and coordination.  MSK:  tender with full range of motion and good stability and symmetric strength and tone of shoulders, elbows, wrist, hip, knee and ankles bilaterally.  Neck exam does have some mild loss of lordosis.  Tender to palpation in the paraspinal musculature left greater than right.  Tightness in the trapezius with 2 distinct trigger  points noted.  Negative Spurling's test.  5 out of 5 strength of the upper extremities.  Mild pain in the parascapular region left greater than right  Osteopathic findings  C2 flexed rotated and side bent right C4 flexed rotated and side bent left C6 flexed rotated and side bent left T3 extended rotated and side bent left inhaled third rib     Impression and Recommendations:     This case required medical decision making of moderate complexity. The above documentation has been reviewed and is accurate and complete Lyndal Pulley, DO       Note: This dictation was prepared with  Dragon dictation along with smaller Company secretary. Any transcriptional errors that result from this process are unintentional.

## 2019-05-28 NOTE — Assessment & Plan Note (Signed)
Neck pain left side, discussed icing regimen and home exercise, patient has done fairly well with conservative therapy.  We discussed that this is a chronic issue with some exacerbation can occur from time to time.  Patient has been to increase activity and tolerated follow-up again 4 to 8 weeks

## 2019-05-28 NOTE — Patient Instructions (Addendum)
Good to see you Heat, theracane, ice If worsening call and we will do trigger points See me again in 4 weeks

## 2019-05-31 ENCOUNTER — Ambulatory Visit: Payer: Medicare PPO | Attending: Internal Medicine

## 2019-05-31 DIAGNOSIS — Z23 Encounter for immunization: Secondary | ICD-10-CM

## 2019-05-31 NOTE — Progress Notes (Signed)
   Covid-19 Vaccination Clinic  Name:  Tina [name]    MRN: [id number] DOB: 1945-11-08  05/31/2019  Tina [name] was observed post Covid-19 immunization for 15 minutes without incidence. She was provided with Vaccine Information Sheet and instruction to access the V-Safe system.   Tina [name] was instructed to call 911 with any severe reactions post vaccine: Marland Kitchen Difficulty breathing  . Swelling of your face and throat  . A fast heartbeat  . A bad rash all over your body  . Dizziness and weakness    Immunizations Administered    Name Date Dose VIS Date Route   Pfizer COVID-19 Vaccine 05/31/2019 10:08 AM 0.3 mL 04/03/2019 Intramuscular   Manufacturer: [employer], [employer]   Lot: [id number]   NDC: [address]-4

## 2019-06-16 ENCOUNTER — Ambulatory Visit: Payer: Medicare Other

## 2019-06-25 ENCOUNTER — Ambulatory Visit: Payer: Medicare PPO | Admitting: Family Medicine

## 2019-06-25 ENCOUNTER — Other Ambulatory Visit: Payer: Self-pay

## 2019-06-25 ENCOUNTER — Encounter: Payer: Self-pay | Admitting: Family Medicine

## 2019-06-25 VITALS — BP 126/80 | HR 71 | Ht 64.0 in | Wt 126.0 lb

## 2019-06-25 DIAGNOSIS — M542 Cervicalgia: Secondary | ICD-10-CM

## 2019-06-25 DIAGNOSIS — M999 Biomechanical lesion, unspecified: Secondary | ICD-10-CM | POA: Diagnosis not present

## 2019-06-25 MED ORDER — MONTELUKAST SODIUM 10 MG PO TABS: 10.0000 mg | ORAL_TABLET | Freq: Every day | ORAL | 1 refills | Status: DC

## 2019-06-25 NOTE — Assessment & Plan Note (Addendum)
Stable overall.  Discussed icing regimen and home exercise, which activities of doing which wants to avoid.  Discussed posture and ergonomics.  Follow-up again in 4 to 8 weeks.  Chronic problem with mild exacerbation.  Social determinants of health includes decreasing exercise outside the house secondary to the coronavirus outbreak

## 2019-06-25 NOTE — Assessment & Plan Note (Signed)
Decision today to treat with OMT was based on Physical Exam  After verbal consent patient was treated with HVLA, ME, FPR techniques in cervical, thoracic, rib areas  Patient tolerated the procedure well with improvement in symptoms  Patient given exercises, stretches and lifestyle modifications  See medications in patient instructions if given  Patient will follow up in 4-8 weeks 

## 2019-06-25 NOTE — Patient Instructions (Addendum)
Good to see you Singulair 10 mg daily See me again in 6 weeks

## 2019-06-25 NOTE — Progress Notes (Signed)
[facility] [address] [place] Phone: [phone] Subjective:   I Tina [name] am serving as a Education administrator for Dr. Hulan Saas.  This visit occurred during the SARS-CoV-2 public health emergency.  Safety protocols were in place, including screening questions prior to the visit, additional usage of staff PPE, and extensive cleaning of exam room while observing appropriate contact time as indicated for disinfecting solutions.   I'm seeing this patient by the request  of:  Panosh, Standley Brooking, MD  CC:   [id number]  Tina [name] is a 74 y.o. female coming in with complaint of back pain. Last seen on 05/28/2019 for OMT. Patient states she is doing well. Patient states she is not doing any worse. Neck is doing better.  Overall she thinks.  Patient Not been as active but wants to be.  Sometimes feels like when she increases activity tightness and discomfort.       Past Medical History:  Diagnosis Date  . High triglycerides   . Hx of varicella    Past Surgical History:  Procedure Laterality Date  . CHOLECYSTECTOMY     Social History   Socioeconomic History  . Marital status: Divorced    Spouse name: Not on file  . Number of children: Not on file  . Years of education: Not on file  . Highest education level: Not on file  Occupational History  . Occupation: Professor    Employer: [employer]  Tobacco Use  . Smoking status: Former Smoker    Packs/day: 0.70    Years: 4.00    Pack years: 2.80  . Smokeless tobacco: Never Used  . Tobacco comment: in her 36's   Substance and Sexual Activity  . Alcohol use: Yes    Alcohol/week: 1.0 standard drinks    Types: 1 Standard drinks or equivalent per week    Comment: ocassionally  . Drug use: No  . Sexual activity: Not on file  Other Topics Concern  . 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    Mother  Moved down to friends home        Divorced one chid and  grandchild   Neg ets firearms.    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  This semester.    Exercises regularly      Neg tad    Social Determinants of Health   Financial Resource Strain:   . Difficulty of Paying Living Expenses: Not on file  Food Insecurity:   . Worried About Charity fundraiser in the Last Year: Not on file  . Ran Out of Food in the Last Year: Not on file  Transportation Needs:   . Lack of Transportation (Medical): Not on file  . Lack of Transportation (Non-Medical): Not on file  Physical Activity:   . Days of Exercise per Week: Not on file  . Minutes of Exercise per Session: Not on file  Stress:   . Feeling of Stress : Not on file  Social Connections:   . Frequency of Communication with Friends and Family: Not on file  . Frequency of Social Gatherings with Friends and Family: Not on file  . Attends Religious Services: Not on file  . Active Member of Clubs or Organizations: Not on file  . Attends  Club or Organization Meetings: Not on file  . Marital Status: Not on file   No Known Allergies Family History  Problem Relation Age of Onset  . Hypertension Mother   . Hyperlipidemia Mother   . Stroke Mother   . Diabetes Father   . Miscarriages / [place] Brother       Current Outpatient Medications (Respiratory):  .  cetirizine (ZYRTEC) 10 MG tablet, Take 10 mg by mouth daily. .  montelukast (SINGULAIR) 10 MG tablet, Take 1 tablet (10 mg total) by mouth at bedtime.    Current Outpatient Medications (Other):  .  cholecalciferol (VITAMIN D) 1000 units tablet, Take 1,000 Units by mouth daily. .  famotidine (PEPCID) 20 MG tablet, Take 20 mg by mouth 2 (two) times daily. .  MULTIPLE VITAMIN PO, Take by mouth.   .  Omega-3 Fatty Acids (OMEGA 3 PO), Take by mouth.   Marland Kitchen  tiZANidine (ZANAFLEX) 4 MG tablet, TAKE 1 TABLET (4 MG TOTAL) BY MOUTH AT BEDTIME. Marland Kitchen  TURMERIC PO, Take by mouth. .  Vitamin  D, Ergocalciferol, (DRISDOL) 1.25 MG (50000 UNIT) CAPS capsule, TAKE 1 CAPSULE (50,000 UNITS TOTAL) BY MOUTH EVERY 7 (SEVEN) DAYS.   Reviewed prior external information including notes and imaging from  primary care provider As well as notes that were available from care everywhere and other healthcare systems.  Past medical history, social, surgical and family history all reviewed in electronic medical record.  No pertanent information unless stated regarding to the chief complaint.   Review of Systems:  No headache, visual changes, nausea, vomiting, diarrhea, constipation, dizziness, abdominal pain, skin rash, fevers, chills, night sweats, weight loss, swollen lymph nodes, body aches, joint swelling, chest pain, shortness of breath, mood changes. POSITIVE muscle aches  Objective  Blood pressure 126/80, pulse 71, height 5\' 4"  (1.626 m), weight 126 lb (57.2 kg), SpO2 98 %.   General: No apparent distress alert and oriented x3 mood and affect normal, dressed appropriately.  HEENT: Pupils equal, extraocular movements intact  Respiratory: Patient's speak in full sentences and does not appear short of breath  Cardiovascular: No lower extremity edema, non tender, no erythema  Skin: Warm dry intact with no signs of infection or rash on extremities or on axial skeleton.  Abdomen: Soft nontender  Neuro: Cranial nerves II through XII are intact, neurovascularly intact in all extremities with 2+ DTRs and 2+ pulses.  Lymph: No lymphadenopathy of posterior or anterior cervical chain or axillae bilaterally.  Gait normal with good balance and coordination.  MSK:  Non tender with full range of motion and good stability and symmetric strength and tone of shoulders, elbows, wrist, hip, knee and ankles bilaterally.  Neck exam still has some loss of lordosis.  Patient does have some tenderness to palpation of the paraspinal musculature on the left greater than right.  Negative Spurling's.  Tightness noted in  the parascapular region.  Osteopathic findings    C6 flexed rotated and side bent left T3 extended rotated and side bent right inhaled third rib T7 extended rotated and side bent left L3 flexed rotated and side bent right Sacrum right on right    Impression and Recommendations:     This case required medical decision making of moderate complexity. The above documentation has been reviewed and is accurate and complete , DO       Note: This dictation was prepared with Dragon dictation along with smaller phrase technology. Any transcriptional errors that result from this process are  unintentional.

## 2019-06-29 ENCOUNTER — Encounter: Payer: Self-pay | Admitting: Family Medicine

## 2019-07-16 DIAGNOSIS — L814 Other melanin hyperpigmentation: Secondary | ICD-10-CM | POA: Diagnosis not present

## 2019-07-16 DIAGNOSIS — L570 Actinic keratosis: Secondary | ICD-10-CM | POA: Diagnosis not present

## 2019-07-16 DIAGNOSIS — D225 Melanocytic nevi of trunk: Secondary | ICD-10-CM | POA: Diagnosis not present

## 2019-07-16 DIAGNOSIS — L578 Other skin changes due to chronic exposure to nonionizing radiation: Secondary | ICD-10-CM | POA: Diagnosis not present

## 2019-07-16 DIAGNOSIS — L821 Other seborrheic keratosis: Secondary | ICD-10-CM | POA: Diagnosis not present

## 2019-07-16 DIAGNOSIS — D2272 Melanocytic nevi of left lower limb, including hip: Secondary | ICD-10-CM | POA: Diagnosis not present

## 2019-07-16 DIAGNOSIS — Z23 Encounter for immunization: Secondary | ICD-10-CM | POA: Diagnosis not present

## 2019-07-16 DIAGNOSIS — Z86018 Personal history of other benign neoplasm: Secondary | ICD-10-CM | POA: Diagnosis not present

## 2019-08-06 ENCOUNTER — Encounter: Payer: Self-pay | Admitting: Family Medicine

## 2019-08-06 ENCOUNTER — Ambulatory Visit: Payer: Medicare PPO | Admitting: Family Medicine

## 2019-08-06 ENCOUNTER — Other Ambulatory Visit: Payer: Self-pay

## 2019-08-06 VITALS — BP 110/78 | HR 73 | Ht 64.0 in | Wt 124.0 lb

## 2019-08-06 DIAGNOSIS — M999 Biomechanical lesion, unspecified: Secondary | ICD-10-CM

## 2019-08-06 DIAGNOSIS — M542 Cervicalgia: Secondary | ICD-10-CM | POA: Diagnosis not present

## 2019-08-06 NOTE — Progress Notes (Signed)
[place] Phone: [phone] Subjective:   [name], am serving as a scribe for Dr. Hulan Saas. This visit occurred during the SARS-CoV-2 public health emergency.  Safety protocols were in place, including screening questions prior to the visit, additional usage of staff PPE, and extensive cleaning of exam room while observing appropriate contact time as indicated for disinfecting solutions.   I'm seeing this patient by the request  of:  Panosh, Standley Brooking, MD  CC: Neck and low back pain  [id number]  Tina [name] is a 74 y.o. female coming in with complaint of back pain. Last seen on 06/25/2019. Patient states that she has not been having any neck issues but is having some pain in SI joints, L>R. Wakes up in middle of night intermittently for past month. Continues to be very active with [employer] and yoga classes.  Patient was doing a lot more yard work recently.  Feels that event potentially cause more discomfort and pain.     Past Medical History:  Diagnosis Date  . High triglycerides   . Hx of varicella    Past Surgical History:  Procedure Laterality Date  . CHOLECYSTECTOMY     Social History   Socioeconomic History  . Marital status: Divorced    Spouse name: Not on file  . Number of children: Not on file  . Years of education: Not on file  . Highest education level: Not on file  Occupational History  . Occupation: Professor    Employer: [employer]  Tobacco Use  . Smoking status: Former Smoker    Packs/day: 0.70    Years: 4.00    Pack years: 2.80  . Smokeless tobacco: Never Used  . Tobacco comment: in her 75's   Substance and Sexual Activity  . Alcohol use: Yes    Alcohol/week: 1.0 standard drinks    Types: 1 Standard drinks or equivalent per week    Comment: ocassionally  . Drug use: No  . Sexual activity: Not on file  Other Topics Concern  . Not on file  Social History  Narrative    Now [employer] no pets    .    Mother  Moved down to friends home        Divorced one chid and grandchild   Neg ets firearms.    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    Retire this summer 17   Educ [place] leave.  This semester.    Exercises regularly      Neg tad    Social Determinants of Health   Financial Resource Strain:   . Difficulty of Paying Living Expenses:   Food Insecurity:   . Worried About Charity fundraiser in the Last Year:   . Arboriculturist in the Last Year:   Transportation Needs:   . Film/video editor (Medical):   Marland Kitchen Lack of Transportation (Non-Medical):   Physical Activity:   . Days of Exercise per Week:   . Minutes of Exercise per Session:   Stress:   . Feeling of Stress :   Social Connections:   . Frequency of Communication with Friends and Family:   . Frequency of Social Gatherings with Friends and Family:   . Attends Religious Services:   . Active Member of Clubs or Organizations:   . Attends Archivist Meetings:   .  Marital Status:    No Known Allergies Family History  Problem Relation Age of Onset  . Hypertension Mother   . Hyperlipidemia Mother   . Stroke Mother   . Diabetes Father   . Miscarriages / [place] Brother       Current Outpatient Medications (Respiratory):  .  cetirizine (ZYRTEC) 10 MG tablet, Take 10 mg by mouth daily. .  montelukast (SINGULAIR) 10 MG tablet, Take 1 tablet (10 mg total) by mouth at bedtime.    Current Outpatient Medications (Other):  .  cholecalciferol (VITAMIN D) 1000 units tablet, Take 1,000 Units by mouth daily. .  famotidine (PEPCID) 20 MG tablet, Take 20 mg by mouth 2 (two) times daily. .  MULTIPLE VITAMIN PO, Take by mouth.   .  Omega-3 Fatty Acids (OMEGA 3 PO), Take by mouth.   Marland Kitchen  tiZANidine (ZANAFLEX) 4 MG tablet, TAKE 1 TABLET (4 MG TOTAL) BY MOUTH AT BEDTIME. Marland Kitchen  TURMERIC PO, Take by mouth. .  Vitamin D, Ergocalciferol, (DRISDOL) 1.25 MG (50000  UNIT) CAPS capsule, TAKE 1 CAPSULE (50,000 UNITS TOTAL) BY MOUTH EVERY 7 (SEVEN) DAYS.   Reviewed prior external information including notes and imaging from  primary care provider As well as notes that were available from care everywhere and other healthcare systems.  Past medical history, social, surgical and family history all reviewed in electronic medical record.  No pertanent information unless stated regarding to the chief complaint.   Review of Systems:  No headache, visual changes, nausea, vomiting, diarrhea, constipation, dizziness, abdominal pain, skin rash, fevers, chills, night sweats, weight loss, swollen lymph nodes, body aches, joint swelling, chest pain, shortness of breath, mood changes. POSITIVE muscle aches  Objective  There were no vitals taken for this visit.   General: No apparent distress alert and oriented x3 mood and affect normal, dressed appropriately.  HEENT: Pupils equal, extraocular movements intact  Respiratory: Patient's speak in full sentences and does not appear short of breath  Cardiovascular: No lower extremity edema, non tender, no erythema  Neuro: Cranial nerves II through XII are intact, neurovascularly intact in all extremities with 2+ DTRs and 2+ pulses.  Gait normal with good balance and coordination.  MSK:  Non tender with full range of motion and good stability and symmetric strength and tone of shoulders, elbows, wrist, hip, knee and ankles bilaterally.  Mild arthritic changes of multiple joints. Neck exam still has some mild loss of lordosis, some decreased range of motion in all planes. Low back exam shows some tightness of the sacroiliac joints left greater than right.  Tender to palpation.  Mild positive Pearlean Brownie on the left side.  Negative straight leg test.  Osteopathic findings C2 flexed rotated and side bent right C4 flexed rotated and side bent left C6 flexed rotated and side bent left T4 extended rotated and side bent right inhaled  rib T9 extended rotated and side bent left L2 flexed rotated and side bent right Left on left    Impression and Recommendations:     This case required medical decision making of moderate complexity. The above documentation has been reviewed and is accurate and complete Wilford Grist       Note: This dictation was prepared with Dragon dictation along with smaller phrase technology. Any transcriptional errors that result from this process are unintentional.

## 2019-08-06 NOTE — Assessment & Plan Note (Signed)
Chronic problem with mild exacerbation.  Has been doing some more yard work recently.  Could be contributing.  Low back and more sacroiliac.  Responds well though to osteopathic manipulation.  Discussed once weekly vitamin D and the chronic medication management.  Zanaflex at night.  Follow-up again in 4 to 8 weeks.

## 2019-08-06 NOTE — Assessment & Plan Note (Signed)

## 2019-08-06 NOTE — Patient Instructions (Signed)
Overall not bad Watch out for the dogs See me again in 5 weeks

## 2019-08-10 ENCOUNTER — Other Ambulatory Visit: Payer: Self-pay | Admitting: Family Medicine

## 2019-09-10 ENCOUNTER — Other Ambulatory Visit: Payer: Self-pay

## 2019-09-10 ENCOUNTER — Encounter: Payer: Self-pay | Admitting: Family Medicine

## 2019-09-10 ENCOUNTER — Ambulatory Visit: Payer: Medicare PPO | Admitting: Family Medicine

## 2019-09-10 VITALS — BP 116/82 | HR 64 | Ht 64.0 in | Wt 123.0 lb

## 2019-09-10 DIAGNOSIS — M999 Biomechanical lesion, unspecified: Secondary | ICD-10-CM | POA: Diagnosis not present

## 2019-09-10 DIAGNOSIS — M542 Cervicalgia: Secondary | ICD-10-CM

## 2019-09-10 NOTE — Progress Notes (Signed)
[name] [address] [place] [address] Phone: [phone] Subjective:   [name], am serving as a scribe for Dr. Antoine Primas. This visit occurred during the SARS-CoV-2 public health emergency.  Safety protocols were in place, including screening questions prior to the visit, additional usage of staff PPE, and extensive cleaning of exam room while observing appropriate contact time as indicated for disinfecting solutions.   I'm seeing this patient by the request  of:  Panosh, [name], MD  CC: Neck and back pain follow-up  [id number]  Tina [name] is a 74 y.o. female coming in with complaint of back pain. Last seen on 08/06/2019 for OMT. Patient states overall doing relatively well.  Some mild discomfort and pain but not severe enough to stop from significant activity.  Denies any numbness or tingling.      Past Medical History:  Diagnosis Date  . High triglycerides   . Hx of varicella    Past Surgical History:  Procedure Laterality Date  . CHOLECYSTECTOMY     Social History   Socioeconomic History  . Marital status: Divorced    Spouse name: Not on file  . Number of children: Not on file  . Years of education: Not on file  . Highest education level: Not on file  Occupational History  . Occupation: Professor    Employer: [employer]  Tobacco Use  . Smoking status: Former Smoker    Packs/day: 0.70    Years: 4.00    Pack years: 2.80  . Smokeless tobacco: Never Used  . Tobacco comment: in her 20's   Substance and Sexual Activity  . Alcohol use: Yes    Alcohol/week: 1.0 standard drinks    Types: 1 Standard drinks or equivalent per week    Comment: ocassionally  . Drug use: No  . Sexual activity: Not on file  Other Topics Concern  . 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    Mother  Moved down to friends home        Divorced one chid and grandchild   Neg ets firearms.    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    Exercises regularly      Neg tad    Social Determinants of Health   Financial Resource Strain:   . Difficulty of Paying Living Expenses:   Food Insecurity:   . Worried About Programme researcher, broadcasting/film/video in the Last Year:   . Barista in the Last Year:   Transportation Needs:   . Freight forwarder (Medical):   Marland Kitchen Lack of Transportation (Non-Medical):   Physical Activity:   . Days of Exercise per Week:   . Minutes of Exercise per Session:   Stress:   . Feeling of Stress :   Social Connections:   . Frequency of Communication with Friends and Family:   . Frequency of Social Gatherings with Friends and Family:   . Attends Religious Services:   . Active Member of Clubs or Organizations:   . Attends Banker Meetings:   Marland Kitchen Marital Status:    No Known Allergies Family History  Problem Relation Age of Onset  . Hypertension Mother   . Hyperlipidemia Mother   . Stroke Mother   .  Diabetes Father   . Miscarriages / [place] Brother       Current Outpatient Medications (Respiratory):  .  cetirizine (ZYRTEC) 10 MG tablet, Take 10 mg by mouth daily. .  montelukast (SINGULAIR) 10 MG tablet, Take 1 tablet (10 mg total) by mouth at bedtime.    Current Outpatient Medications (Other):  .  cholecalciferol (VITAMIN D) 1000 units tablet, Take 1,000 Units by mouth daily. .  famotidine (PEPCID) 20 MG tablet, Take 20 mg by mouth 2 (two) times daily. .  MULTIPLE VITAMIN PO, Take by mouth.   .  Omega-3 Fatty Acids (OMEGA 3 PO), Take by mouth.   Marland Kitchen  tiZANidine (ZANAFLEX) 4 MG tablet, TAKE 1 TABLET (4 MG TOTAL) BY MOUTH AT BEDTIME. Marland Kitchen  TURMERIC PO, Take by mouth. .  Vitamin D, Ergocalciferol, (DRISDOL) 1.25 MG (50000 UNIT) CAPS capsule, TAKE 1 CAPSULE (50,000 UNITS TOTAL) BY MOUTH EVERY 7 (SEVEN) DAYS.   Reviewed prior external information including notes and imaging from    primary care provider As well as notes that were available from care everywhere and other healthcare systems.  Past medical history, social, surgical and family history all reviewed in electronic medical record.  No pertanent information unless stated regarding to the chief complaint.   Review of Systems:  No headache, visual changes, nausea, vomiting, diarrhea, constipation, dizziness, abdominal pain, skin rash, fevers, chills, night sweats, weight loss, swollen lymph nodes, body aches, joint swelling, chest pain, shortness of breath, mood changes. POSITIVE muscle aches  Objective  Blood pressure 116/82, pulse 64, height 5\' 4"  (1.626 m), weight 123 lb (55.8 kg), SpO2 98 %.   General: No apparent distress alert and oriented x3 mood and affect normal, dressed appropriately.  HEENT: Pupils equal, extraocular movements intact  Respiratory: Patient's speak in full sentences and does not appear short of breath  Cardiovascular: No lower extremity edema, non tender, no erythema  Neuro: Cranial nerves II through XII are intact, neurovascularly intact in all extremities with 2+ DTRs and 2+ pulses.  Gait normal with good balance and coordination.  MSK:  Non tender with full range of motion and good stability and symmetric strength and tone of shoulders, elbows, wrist, hip, knee and ankles bilaterally.  Neck exam mild loss of lordosis.  Some mild tightness noted in the paraspinal musculature in the parascapular region.  Patient has negative Spurling's.  Does have some limited sidebending bilaterally right greater than left.  Osteopathic findings  C2 flexed rotated and side bent right C4 flexed rotated and side bent left T4 extended rotated and side bent right inhaled rib T7 extended rotated and side bent left L2 flexed rotated and side bent right Sacrum right on right    Impression and Recommendations:     This case required medical decision making of moderate complexity. The above  documentation has been reviewed and is accurate and complete Lyndal Pulley, DO       Note: This dictation was prepared with Dragon dictation along with smaller phrase technology. Any transcriptional errors that result from this process are unintentional.

## 2019-09-10 NOTE — Assessment & Plan Note (Signed)

## 2019-09-10 NOTE — Patient Instructions (Signed)
See me again in 6-7 weeks ?

## 2019-09-10 NOTE — Assessment & Plan Note (Signed)
Arthritic changes but doing relatively well.  Most of it seems to be more scapular instability still.  Has muscle relaxers for breakthrough.  Discussed icing regimen and home exercises, increase activity slowly.  Follow-up again in 6 to 8 weeks

## 2019-10-16 DIAGNOSIS — Z20822 Contact with and (suspected) exposure to covid-19: Secondary | ICD-10-CM | POA: Diagnosis not present

## 2019-10-19 DIAGNOSIS — Z20822 Contact with and (suspected) exposure to covid-19: Secondary | ICD-10-CM | POA: Diagnosis not present

## 2019-10-21 ENCOUNTER — Encounter: Payer: Self-pay | Admitting: Family Medicine

## 2019-11-02 ENCOUNTER — Other Ambulatory Visit: Payer: Self-pay

## 2019-11-02 ENCOUNTER — Ambulatory Visit: Payer: Medicare PPO | Admitting: Family Medicine

## 2019-11-02 ENCOUNTER — Encounter: Payer: Self-pay | Admitting: Family Medicine

## 2019-11-02 VITALS — BP 110/86 | HR 64 | Ht 64.0 in | Wt 122.0 lb

## 2019-11-02 DIAGNOSIS — M999 Biomechanical lesion, unspecified: Secondary | ICD-10-CM

## 2019-11-02 DIAGNOSIS — M7062 Trochanteric bursitis, left hip: Secondary | ICD-10-CM | POA: Insufficient documentation

## 2019-11-02 NOTE — Progress Notes (Signed)
[name] [address] [place] [address] Phone: [phone] Subjective:   I Tina [name] am serving as a Neurosurgeon for Dr. Antoine Primas.  This visit occurred during the SARS-CoV-2 public health emergency.  Safety protocols were in place, including screening questions prior to the visit, additional usage of staff PPE, and extensive cleaning of exam room while observing appropriate contact time as indicated for disinfecting solutions.   I'm seeing this patient by the request  of:  Panosh, Tina Mends, MD  CC: Neck pain follow-up, left hip pain  [id number]  Tina [name] is a 74 y.o. female coming in with complaint of back, hip and neck pain. OMT 09/10/2019. Patient states she is doing well except for the hip. States she believes she has bursitis in the hip. States she woke up with pain while on vacation.   Medications patient has been prescribed: Vitamin D and Zanaflex.  Do take the ibuprofen as suggested from the MyChart discussion and did have significant improvement in the hip when she was on it but now having pain again.  States it does not wake her up but it does seem to get worse after sitting for long amount of time.       Reviewed prior external information including notes and imaging from previsou exam, outside providers and external EMR if available.   As well as notes that were available from care everywhere and other healthcare systems.  Past medical history, social, surgical and family history all reviewed in electronic medical record.  No pertanent information unless stated regarding to the chief complaint.   Past Medical History:  Diagnosis Date  . High triglycerides   . Hx of varicella     No Known Allergies NKDA   Review of Systems:  No headache, visual changes, nausea, vomiting, diarrhea, constipation, dizziness, abdominal pain, skin rash, fevers, chills, night sweats, weight loss, swollen lymph nodes, body aches, joint  swelling, chest pain, shortness of breath, mood changes. POSITIVE muscle aches  Objective  Blood pressure 110/86, pulse 64, height 5\' 4"  (1.626 m), weight 122 lb (55.3 kg), SpO2 98 %.   General: No apparent distress alert and oriented x3 mood and affect normal, dressed appropriately.  HEENT: Pupils equal, extraocular movements intact  Respiratory: Patient's speak in full sentences and does not appear short of breath  Cardiovascular: No lower extremity edema, non tender, no erythema  Neuro: Cranial nerves II through XII are intact, neurovascularly intact in all extremities with 2+ DTRs and 2+ pulses.  Gait normal with good balance and coordination.  MSK: Arthritic changes of multiple joints Back -low back exam shows the patient is nontender to palpation over the paraspinal musculature lumbar spine left greater than right.  Tenderness over the left greater trochanteric bursa significantly.  After verbal consent patient was prepped with alcohol swab and with a 21-gauge 2 inch needle injected into the left greater trochanteric area with a total of 2 cc of 0.5% Marcaine and 1 cc of Kenalog 40 mg/mL.  No blood loss.  Band-Aid placed.  Postinjection instructions given.  Osteopathic findings  C2 flexed rotated and side bent right C7 flexed rotated and side bent left T3 extended rotated and side bent right inhaled rib T8 extended rotated and side bent left L2 flexed rotated and side bent right Sacrum right on right       Assessment and Plan:   Greater trochanteric bursitis of left hip Injection given today, tolerated procedure well, discussed icing regimen  and home exercise, which activities to do which wants to avoid.  Patient will work on hip abductor strength.  Differential includes a lumbar radiculopathy as well as the piriformis syndrome.  Increase activity slowly.  Follow-up again in 4 to 8 weeks  Nonallopathic lesion of cervical region   Decision today to treat with OMT was based  on Physical Exam  After verbal consent patient was treated with HVLA, ME, FPR techniques in cervical, thoracic, rib, lumbar and sacral areas, all areas are chronic   Patient tolerated the procedure well with improvement in symptoms  Patient given exercises, stretches and lifestyle modifications  See medications in patient instructions if given  Patient will follow up in 4-8 weeks        The above documentation has been reviewed and is accurate and complete [name], DO       Note: This dictation was prepared with Dragon dictation along with smaller phrase technology. Any transcriptional errors that result from this process are unintentional.

## 2019-11-02 NOTE — Patient Instructions (Addendum)
Good to see you Send me a message Monday see how the shot did Piriformis exercises See me again in 5-6 weeks

## 2019-11-02 NOTE — Assessment & Plan Note (Signed)
Injection given today, tolerated procedure well, discussed icing regimen and home exercise, which activities to do which wants to avoid.  Patient will work on hip abductor strength.  Differential includes a lumbar radiculopathy as well as the piriformis syndrome.  Increase activity slowly.  Follow-up again in 4 to 8 weeks

## 2019-11-02 NOTE — Assessment & Plan Note (Signed)

## 2019-11-09 ENCOUNTER — Encounter: Payer: Self-pay | Admitting: Family Medicine

## 2019-11-13 NOTE — Progress Notes (Signed)
Chief Complaint  Patient presents with  . Annual Exam    Doing okay    HPI: Tina [name] 74 y.o. comes in today for Preventive Medicare exam/ wellness visit .Since last visit. Doing pretty well  reactive    Nasal stuffiness  despite's zyrtec and flonase  Ha some throat clearing   Health Maintenance  Topic Date Due  . Hepatitis C Screening  Never done  . OPHTHALMOLOGY EXAM  11/21/2016  . HEMOGLOBIN A1C  05/17/2019  . INFLUENZA VACCINE  11/22/2019  . MAMMOGRAM  11/20/2020  . COLONOSCOPY  09/13/2022  . TETANUS/TDAP  11/22/2026  . DEXA SCAN  Completed  . COVID-19 Vaccine  Completed  . PNA vac Low Risk Adult  Completed   Health Maintenance Review LIFESTYLE:  Exercise:   Walks the dog  And goes [facility] [employer] [facility]  And yoga  Tobacco/ETS:n Alcohol: not really  Reflux  Sugar beverages: no Sleep:  Ave 7 hours  Drug use: no HH: 1 and dog   Omega  3 vits  fam hx MD so taling in case  And vit d    Hearing:  Ok   Vision:  No limitations at present . Last eye check UTD  Safety:  Has smoke detector and wears seat belts. . No excess sun exposure. Sees dentist regularly.  Falls: n  Memory: Felt to be good   No worsening , no concern from her or her family.  Depression: No anhedonia unusual crying or depressive symptoms  Nutrition: Eats well balanced diet; adequate calcium and vitamin D. No swallowing chewing problems.  Injury: no major injuries in the last six months.  Other healthcare providers:  Reviewed today .   Preventive parameters: up-to-date  Reviewed   ADLS:   There are no problems or need for assistance  driving, feeding, obtaining food, dressing, toileting and bathing, managing money using phone. She is independent. Planning trip to [place] trip in the fall  May need  antibiotic in case  Last typhoid in past 5 years and utd   ROS:  GEN/ HEENT: No fever, significant weight changes sweats headaches vision problems hearing changes, CV/ PULM;  No chest pain shortness of breath cough, syncope,edema  change in exercise tolerance. GI /GU: No adominal pain, vomiting, change in bowel habits. No blood in the stool. No significant GU symptoms. SKIN/HEME: ,no acute skin rashes suspicious lesions or bleeding. No lymphadenopathy, nodules, masses.  NEURO/ PSYCH:  No neurologic signs such as weakness numbness. No depression anxiety. IMM/ Allergy: No unusual infections.  Allergy .   REST of 12 system review negative except as per HPI   Past Medical History:  Diagnosis Date  . High triglycerides   . Hx of varicella     Family History  Problem Relation Age of Onset  . Hypertension Mother   . Hyperlipidemia Mother   . Stroke Mother   . Diabetes Father   . Miscarriages / [place] Brother     Social History   Socioeconomic History  . Marital status: Divorced    Spouse name: Not on file  . Number of children: Not on file  . Years of education: Not on file  . Highest education level: Not on file  Occupational History  . Occupation: Professor    Employer: [employer]  Tobacco Use  . Smoking status: Former Smoker    Packs/day: 0.70    Years: 4.00    Pack years: 2.80  . Smokeless tobacco: Never Used  .  Tobacco comment: in her 55's   Vaping Use  . Vaping Use: Never used  Substance and Sexual Activity  . Alcohol use: Yes    Alcohol/week: 1.0 standard drink    Types: 1 Standard drinks or equivalent per week    Comment: ocassionally  . Drug use: No  . Sexual activity: Not on file  Other Topics Concern  . 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    Mother  Moved down to friends home        Divorced one chid and grandchild   Neg ets firearms.    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    Exercises regularly      Neg tad    Social Determinants of Health   Financial Resource Strain: Low Risk   . Difficulty of Paying  Living Expenses: Not very hard  Food Insecurity: No Food Insecurity  . Worried About Programme researcher, broadcasting/film/video in the Last Year: Never true  . Ran Out of Food in the Last Year: Never true  Transportation Needs: No Transportation Needs  . Lack of Transportation (Medical): No  . Lack of Transportation (Non-Medical): No  Physical Activity: Sufficiently Active  . Days of Exercise per Week: 5 days  . Minutes of Exercise per Session: 50 min  Stress: No Stress Concern Present  . Feeling of Stress : Not at all  Social Connections: Moderately Isolated  . Frequency of Communication with Friends and Family: More than three times a week  . Frequency of Social Gatherings with Friends and Family: More than three times a week  . Attends Religious Services: Never  . Active Member of Clubs or Organizations: Yes  . Attends Banker Meetings: More than 4 times per year  . Marital Status: Divorced    Outpatient Encounter Medications as of 11/16/2019  Medication Sig  . cetirizine (ZYRTEC) 10 MG tablet Take 10 mg by mouth daily.  . cholecalciferol (VITAMIN D) 1000 units tablet Take 1,000 Units by mouth daily.  . famotidine (PEPCID) 20 MG tablet Take 20 mg by mouth 2 (two) times daily.  . MULTIPLE VITAMIN PO Take by mouth.    . Omega-3 Fatty Acids (OMEGA 3 PO) Take by mouth.    Marland Kitchen tiZANidine (ZANAFLEX) 4 MG tablet TAKE 1 TABLET (4 MG TOTAL) BY MOUTH AT BEDTIME.  Marland Kitchen TURMERIC PO Take by mouth.  . [DISCONTINUED] montelukast (SINGULAIR) 10 MG tablet Take 1 tablet (10 mg total) by mouth at bedtime.  . [DISCONTINUED] Vitamin D, Ergocalciferol, (DRISDOL) 1.25 MG (50000 UNIT) CAPS capsule TAKE 1 CAPSULE (50,000 UNITS TOTAL) BY MOUTH EVERY 7 (SEVEN) DAYS.   No facility-administered encounter medications on file as of 11/16/2019.    EXAM:  BP (!) 130/80   Pulse 75   Temp 98.2 F (36.8 C) (Oral)   Ht 5' 3.75" (1.619 m)   Wt 120 lb (54.4 kg)   SpO2 99%   BMI 20.76 kg/m   Body mass index is 20.76  kg/m.  Physical Exam: Vital signs reviewed EGB:TDVV is a well-developed well-nourished alert cooperative   who appears stated age in no acute distress.  HEENT: normocephalic atraumatic , Eyes: PERRL EOM's full, conjunctiva clear, Nares: paten,t no deformity discharge or tenderness., Ears: no deformity EAC's clear TMs with normal landmarks.  Mouth:masked NECK: supple without masses, thyromegaly or bruits. CHEST/PULM:  Clear to auscultation and percussion breath sounds equal no wheeze , rales or rhonchi. No chest wall deformities or tenderness. CV: PMI is nondisplaced, S1 S2 no gallops, murmurs, rubs. Peripheral pulses are full without delay.No JVD .  Breast: normal by inspection . No dimpling, discharge, masses, tenderness or discharge . ABDOMEN: Bowel sounds normal nontender  No guard or rebound, no hepato splenomegal no CVA tenderness.   Extremtities:  No clubbing cyanosis or edema, no acute joint swelling or redness no focal atrophy NEURO:  Oriented x3, cranial nerves 3-12 appear to be intact, no obvious focal weakness,gait within normal limits no abnormal reflexes or asymmetrical SKIN: No acute rashes normal turgor, color, no bruising or petechiae. PSYCH: Oriented, good eye contact, no obvious depression anxiety, cognition and judgment appear normal. LN: no cervical axillary inguinal adenopathy No noted deficits in memory, attention, and speech.   Lab Results  Component Value Date   WBC 5.9 05/07/2018   HGB 13.7 05/07/2018   HCT 39.9 05/07/2018   PLT 320.0 05/07/2018   GLUCOSE 98 11/14/2018   CHOL 200 11/14/2018   TRIG 96.0 11/14/2018   HDL 62.10 11/14/2018   LDLDIRECT 137.3 07/02/2012   LDLCALC 119 (H) 11/14/2018   ALT 18 11/14/2018   AST 19 11/14/2018   NA 138 11/14/2018   K 4.6 11/14/2018   CL 104 11/14/2018   CREATININE 0.74 11/14/2018   BUN 21 11/14/2018   CO2 27 11/14/2018   TSH 1.30 11/14/2018   HGBA1C 5.8 11/14/2018   MICROALBUR 0.7 07/12/2015    ASSESSMENT AND  PLAN:  Discussed the following assessment and plan:  Visit for preventive health examination  Medication management - Plan: CBC with Differential/Platelet, Hemoglobin A1c, Hepatic function panel, Lipid panel, TSH, BASIC METABOLIC PANEL WITH GFR, BASIC METABOLIC PANEL WITH GFR, TSH, Lipid panel, Hepatic function panel, Hemoglobin A1c, CBC with Differential/Platelet  Elevated blood sugar - Plan: CBC with Differential/Platelet, Hemoglobin A1c, Hepatic function panel, Lipid panel, TSH, BASIC METABOLIC PANEL WITH GFR, BASIC METABOLIC PANEL WITH GFR, TSH, Lipid panel, Hepatic function panel, Hemoglobin A1c, CBC with Differential/Platelet  Hyperlipidemia, mixed - Plan: CBC with Differential/Platelet, Hemoglobin A1c, Hepatic function panel, Lipid panel, TSH, BASIC METABOLIC PANEL WITH GFR, BASIC METABOLIC PANEL WITH GFR, TSH, Lipid panel, Hepatic function panel, Hemoglobin A1c, CBC with Differential/Platelet Nasal Stuffiness  Reactive .    consdier  Allergic eval and or ent if progressive    She is not diabetic  But has  Family hx of hx of elevation but is not diabetic   Contact [place] pre travel consider rx for azithromycin if needed for travel gi illness  Patient Care Team: Analeigh Aries, [name], MD as PCP - General (Internal Medicine) Campbell Stall, MD as Attending Physician (Dermatology) Elliot Cousin, OD [facility])  Patient Instructions  vasomomtor rhinitis vs allergic cause of stuffiness .  Will notify you  of labs when available.  Continue lifestyle intervention healthy eating and exercise .  Yearly check  Or as needed   Health Maintenance, Female Adopting a healthy lifestyle and getting preventive care are important in promoting health and wellness. Ask your health care provider about:  The right schedule for you to have regular tests and exams.  Things you can do on your own to prevent diseases and keep yourself healthy. What should I know about diet, weight, and exercise? Eat a healthy  diet   Eat a diet that includes plenty of vegetables, fruits, low-fat dairy  products, and lean protein.  Do not eat a lot of foods that are high in solid fats, added sugars, or sodium. Maintain a healthy weight Body mass index (BMI) is used to identify weight problems. It estimates body fat based on height and weight. Your health care provider can help determine your BMI and help you achieve or maintain a healthy weight. Get regular exercise Get regular exercise. This is one of the most important things you can do for your health. Most adults should:  Exercise for at least 150 minutes each week. The exercise should increase your heart rate and make you sweat (moderate-intensity exercise).  Do strengthening exercises at least twice a week. This is in addition to the moderate-intensity exercise.  Spend less time sitting. Even light physical activity can be beneficial. Watch cholesterol and blood lipids Have your blood tested for lipids and cholesterol at 73 years of age, then have this test every 5 years. Have your cholesterol levels checked more often if:  Your lipid or cholesterol levels are high.  You are older than 73 years of age.  You are at high risk for heart disease. What should I know about cancer screening? Depending on your health history and family history, you may need to have cancer screening at various ages. This may include screening for:  Breast cancer.  Cervical cancer.  Colorectal cancer.  Skin cancer.  Lung cancer. What should I know about heart disease, diabetes, and high blood pressure? Blood pressure and heart disease  High blood pressure causes heart disease and increases the risk of stroke. This is more likely to develop in people who have high blood pressure readings, are of African descent, or are overweight.  Have your blood pressure checked: ? Every 3-5 years if you are 3-58 years of age. ? Every year if you are 37 years old or  older. Diabetes Have regular diabetes screenings. This checks your fasting blood sugar level. Have the screening done:  Once every three years after age 32 if you are at a normal weight and have a low risk for diabetes.  More often and at a younger age if you are overweight or have a high risk for diabetes. What should I know about preventing infection? Hepatitis B If you have a higher risk for hepatitis B, you should be screened for this virus. Talk with your health care provider to find out if you are at risk for hepatitis B infection. Hepatitis C Testing is recommended for:  Everyone born from [birth year] through 1964.  Anyone with known risk factors for hepatitis C. Sexually transmitted infections (STIs)  Get screened for STIs, including gonorrhea and chlamydia, if: ? You are sexually active and are younger than 73 years of age. ? You are older than 73 years of age and your health care provider tells you that you are at risk for this type of infection. ? Your sexual activity has changed since you were last screened, and you are at increased risk for chlamydia or gonorrhea. Ask your health care provider if you are at risk.  Ask your health care provider about whether you are at high risk for HIV. Your health care provider may recommend a prescription medicine to help prevent HIV infection. If you choose to take medicine to prevent HIV, you should first get tested for HIV. You should then be tested every 3 months for as long as you are taking the medicine. Pregnancy  If you are about to stop having your period (  premenopausal) and you may become pregnant, seek counseling before you get pregnant.  Take 400 to 800 micrograms (mcg) of folic acid every day if you become pregnant.  Ask for birth control (contraception) if you want to prevent pregnancy. Osteoporosis and menopause Osteoporosis is a disease in which the bones lose minerals and strength with aging. This can result in bone fractures.  If you are 53 years old or older, or if you are at risk for osteoporosis and fractures, ask your health care provider if you should:  Be screened for bone loss.  Take a calcium or vitamin D supplement to lower your risk of fractures.  Be given hormone replacement therapy (HRT) to treat symptoms of menopause. Follow these instructions at home: Lifestyle  Do not use any products that contain nicotine or tobacco, such as cigarettes, e-cigarettes, and chewing tobacco. If you need help quitting, ask your health care provider.  Do not use street drugs.  Do not share needles.  Ask your health care provider for help if you need support or information about quitting drugs. Alcohol use  Do not drink alcohol if: ? Your health care provider tells you not to drink. ? You are pregnant, may be pregnant, or are planning to become pregnant.  If you drink alcohol: ? Limit how much you use to 0-1 drink a day. ? Limit intake if you are breastfeeding.  Be aware of how much alcohol is in your drink. In the [place] one drink equals one 12 oz bottle of beer (355 mL), one 5 oz glass of wine (148 mL), or one 1 oz glass of hard liquor (44 mL). General instructions  Schedule regular health, dental, and eye exams.  Stay current with your vaccines.  Tell your health care provider if: ? You often feel depressed. ? You have ever been abused or do not feel safe at home. Summary  Adopting a healthy lifestyle and getting preventive care are important in promoting health and wellness.  Follow your health care provider's instructions about healthy diet, exercising, and getting tested or screened for diseases.  Follow your health care provider's instructions on monitoring your cholesterol and blood pressure. This information is not intended to replace advice given to you by your health care provider. Make sure you discuss any questions you have with your health care provider. Document Revised: 04/02/2018  Document Reviewed: 04/02/2018 Elsevier Patient Education  2020 [employer]    [place]lands RanchWanda K. Sabrena Gavitt M.D.

## 2019-11-16 ENCOUNTER — Ambulatory Visit (INDEPENDENT_AMBULATORY_CARE_PROVIDER_SITE_OTHER): Payer: Medicare PPO | Admitting: Internal Medicine

## 2019-11-16 ENCOUNTER — Ambulatory Visit (INDEPENDENT_AMBULATORY_CARE_PROVIDER_SITE_OTHER): Payer: Medicare PPO

## 2019-11-16 ENCOUNTER — Other Ambulatory Visit: Payer: Self-pay

## 2019-11-16 ENCOUNTER — Encounter: Payer: Self-pay | Admitting: Internal Medicine

## 2019-11-16 ENCOUNTER — Telehealth: Payer: Self-pay | Admitting: Internal Medicine

## 2019-11-16 VITALS — BP 130/80 | HR 75 | Temp 98.2°F | Ht 63.75 in | Wt 120.0 lb

## 2019-11-16 DIAGNOSIS — Z0000 Encounter for general adult medical examination without abnormal findings: Secondary | ICD-10-CM | POA: Diagnosis not present

## 2019-11-16 DIAGNOSIS — E782 Mixed hyperlipidemia: Secondary | ICD-10-CM

## 2019-11-16 DIAGNOSIS — Z780 Asymptomatic menopausal state: Secondary | ICD-10-CM | POA: Diagnosis not present

## 2019-11-16 DIAGNOSIS — R739 Hyperglycemia, unspecified: Secondary | ICD-10-CM

## 2019-11-16 DIAGNOSIS — Z79899 Other long term (current) drug therapy: Secondary | ICD-10-CM | POA: Diagnosis not present

## 2019-11-16 DIAGNOSIS — Z1159 Encounter for screening for other viral diseases: Secondary | ICD-10-CM | POA: Diagnosis not present

## 2019-11-16 NOTE — Progress Notes (Signed)
Subjective:   Tina [name] is a 74 y.o. female who presents for Medicare Annual (Subsequent) preventive examination.  I connected with Tina [name] today by telephone and verified that I am speaking with the correct person using two identifiers. Location patient: home Location provider: work Persons participating in the virtual visit: patient, provider.   I discussed the limitations, risks, security and privacy concerns of performing an evaluation and management service by telephone and the availability of in person appointments. I also discussed with the patient that there may be a patient responsible charge related to this service. The patient expressed understanding and verbally consented to this telephonic visit.    Interactive audio and video telecommunications were attempted between this provider and patient, however failed, due to patient having technical difficulties OR patient did not have access to video capability.  We continued and completed visit with audio only.     Review of Systems    N/A Cardiac Risk Factors include: advanced age (>57men, >77 women);dyslipidemia     Objective:    Today's Vitals   11/16/19 1140  Weight: 120 lb (54.4 kg)   Body mass index is 20.76 kg/m.  Advanced Directives 11/16/2019 07/26/2016  Does Patient Have a Medical Advance Directive? Yes Yes  Type of Estate agent of [place];Living will -  Does patient want to make changes to medical advance directive? No - Patient declined -  Copy of Healthcare Power of Attorney in Chart? Yes - validated most recent copy scanned in chart (See row information) -    Current Medications (verified) Outpatient Encounter Medications as of 11/16/2019  Medication Sig  . cetirizine (ZYRTEC) 10 MG tablet Take 10 mg by mouth daily.  . cholecalciferol (VITAMIN D) 1000 units tablet Take 1,000 Units by mouth daily.  . famotidine (PEPCID) 20 MG tablet Take 20 mg by mouth 2 (two) times daily.  .  MULTIPLE VITAMIN PO Take by mouth.    . Omega-3 Fatty Acids (OMEGA 3 PO) Take by mouth.    Marland Kitchen tiZANidine (ZANAFLEX) 4 MG tablet TAKE 1 TABLET (4 MG TOTAL) BY MOUTH AT BEDTIME.  Marland Kitchen TURMERIC PO Take by mouth.  . montelukast (SINGULAIR) 10 MG tablet Take 1 tablet (10 mg total) by mouth at bedtime.  . Vitamin D, Ergocalciferol, (DRISDOL) 1.25 MG (50000 UNIT) CAPS capsule TAKE 1 CAPSULE (50,000 UNITS TOTAL) BY MOUTH EVERY 7 (SEVEN) DAYS.   No facility-administered encounter medications on file as of 11/16/2019.    Allergies (verified) Patient has no known allergies.   History: Past Medical History:  Diagnosis Date  . High triglycerides   . Hx of varicella    Past Surgical History:  Procedure Laterality Date  . CHOLECYSTECTOMY     Family History  Problem Relation Age of Onset  . Hypertension Mother   . Hyperlipidemia Mother   . Stroke Mother   . Diabetes Father   . Miscarriages / [place] Brother    Social History   Socioeconomic History  . Marital status: Divorced    Spouse name: Not on file  . Number of children: Not on file  . Years of education: Not on file  . Highest education level: Not on file  Occupational History  . Occupation: Professor    Employer: [employer]  Tobacco Use  . Smoking status: Former Smoker    Packs/day: 0.70    Years: 4.00    Pack years: 2.80  . Smokeless tobacco: Never Used  . Tobacco comment: in her 93's  Vaping Use  . Vaping Use: Never used  Substance and Sexual Activity  . Alcohol use: Yes    Alcohol/week: 1.0 standard drink    Types: 1 Standard drinks or equivalent per week    Comment: ocassionally  . Drug use: No  . Sexual activity: Not on file  Other Topics Concern  . 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    Mother  Moved down to friends home        Divorced one chid and grandchild   Neg ets firearms.    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    Exercises regularly      Neg tad    Social Determinants of Health   Financial Resource Strain: Low Risk   . Difficulty of Paying Living Expenses: Not very hard  Food Insecurity: No Food Insecurity  . Worried About Programme researcher, broadcasting/film/videounning Out of Food in the Last Year: Never true  . Ran Out of Food in the Last Year: Never true  Transportation Needs: No Transportation Needs  . Lack of Transportation (Medical): No  . Lack of Transportation (Non-Medical): No  Physical Activity: Sufficiently Active  . Days of Exercise per Week: 5 days  . Minutes of Exercise per Session: 50 min  Stress: No Stress Concern Present  . Feeling of Stress : Not at all  Social Connections: Moderately Isolated  . Frequency of Communication with Friends and Family: More than three times a week  . Frequency of Social Gatherings with Friends and Family: More than three times a week  . Attends Religious Services: Never  . Active Member of Clubs or Organizations: Yes  . Attends BankerClub or Organization Meetings: More than 4 times per year  . Marital Status: Divorced    Tobacco Counseling Counseling given: Not Answered Comment: in her 20's    Clinical Intake:  Pre-visit preparation completed: Yes  Pain : No/denies pain     Nutritional Status: BMI of 19-24  Normal Nutritional Risks: None Diabetes: No  How often do you need to have someone help you when you read instructions, pamphlets, or other written materials from your doctor or pharmacy?: 1 - Never What is the last grade level you completed in school?: Phd  Diabetic?No  Interpreter Needed?: No  Information entered by :: SCrews,LPN   Activities of Daily Living In your present state of health, do you have any difficulty performing the following activities: 11/16/2019  Hearing? N  Vision? N  Difficulty concentrating or making decisions? N  Walking or climbing stairs? N  Dressing or bathing? N  Doing errands, shopping? N    Preparing Food and eating ? N  Using the Toilet? N  In the past six months, have you accidently leaked urine? N  Do you have problems with loss of bowel control? N  Managing your Medications? N  Managing your Finances? N  Housekeeping or managing your Housekeeping? N  Some recent data might be hidden    Patient Care Team: Panosh, Neta MendsWanda K, MD as PCP - General (Internal Medicine) Campbell StallGruber, Hope, MD as Attending Physician (Dermatology) Elliot CousinByrnes, Charles, OD (Optometry)  Indicate any recent Medical Services you may have received from other than [facility] providers in the past year (date may be approximate).     Assessment:   This  is a routine wellness examination for [place]i.  Hearing/Vision screen  Hearing Screening   125Hz  250Hz  500Hz  1000Hz  2000Hz  3000Hz  4000Hz  6000Hz  8000Hz   Right ear:           Left ear:           Vision Screening Comments: Gets annual eye exams yearly   Dietary issues and exercise activities discussed: Current Exercise Habits: Structured exercise class, Type of exercise: stretching;walking;yoga, Time (Minutes): 50, Frequency (Times/Week): 7, Weekly Exercise (Minutes/Week): 350, Intensity: Moderate, Exercise limited by: None identified  Goals    . patient     Do more cardiovascular work     . Patient Stated     I will continue to go to the gym 3 days per week.      Depression Screen PHQ 2/9 Scores 11/16/2019 11/16/2019 11/14/2018 07/29/2017 07/26/2016 07/23/2016 07/19/2015  PHQ - 2 Score 0 0 0 0 0 0 0  PHQ- 9 Score 0 0 - - - - -    Fall Risk Fall Risk  11/16/2019 11/16/2019 11/14/2018 07/29/2017 07/26/2016  Falls in the past year? 0 0 0 No No  Number falls in past yr: 0 - 0 - -  Injury with Fall? 0 - 0 - -  Risk for fall due to : No Fall Risks - - - -  Follow up Falls evaluation completed;Falls prevention discussed - - - -    Any stairs in or around the home? Yes  If so, are there any without handrails? No  Home free of loose throw rugs in walkways, pet beds,  electrical cords, etc? Yes  Adequate lighting in your home to reduce risk of falls? Yes   ASSISTIVE DEVICES UTILIZED TO PREVENT FALLS:  Life alert? No  Use of a cane, walker or w/c? No  Grab bars in the bathroom? Yes  Shower chair or bench in shower? No  Elevated toilet seat or a handicapped toilet? No     Cognitive Function: MMSE - Mini Mental State Exam 07/26/2016  Not completed: (No Data)     6CIT Screen 11/16/2019  What Year? 0 points  What month? 0 points  What time? 0 points  Count back from 20 0 points  Months in reverse 0 points  Repeat phrase 0 points  Total Score 0    Immunizations Immunization History  Administered Date(s) Administered  . Fluad Quad(high Dose 65+) 01/07/2019  . Hepatitis B 09/09/1995, 10/11/1995, 07/03/1996  . Influenza Split 01/22/2012  . Influenza, High Dose Seasonal PF 03/02/2015, 02/03/2016, 01/14/2017, 02/03/2018  . Influenza-Unspecified 02/03/2014, 01/14/2017  . PFIZER SARS-COV-2 Vaccination 05/12/2019, 05/31/2019  . Pneumococcal Conjugate-13 07/07/2012  . Pneumococcal Polysaccharide-23 03/27/1999, 07/21/2013  . Td 11/21/2016  . Tdap 03/18/2006  . Zoster 05/12/2008  . Zoster Recombinat (Shingrix) 01/14/2017    TDAP status: Up to date Flu Vaccine status: Up to date Pneumococcal vaccine status: Up to date Covid-19 vaccine status: Completed vaccines  Qualifies for Shingles Vaccine? Yes   Zostavax completed Yes   Shingrix Completed?: Yes  Screening Tests Health Maintenance  Topic Date Due  . Hepatitis C Screening  Never done  . OPHTHALMOLOGY EXAM  11/21/2016  . HEMOGLOBIN A1C  05/17/2019  . INFLUENZA VACCINE  11/22/2019  . MAMMOGRAM  11/20/2020  . COLONOSCOPY  09/13/2022  . TETANUS/TDAP  11/22/2026  . DEXA SCAN  Completed  . COVID-19 Vaccine  Completed  . PNA vac Low Risk Adult  Completed    Health Maintenance  Health Maintenance Due  Topic Date  Due  . Hepatitis C Screening  Never done  . OPHTHALMOLOGY EXAM   11/21/2016  . HEMOGLOBIN A1C  05/17/2019    Colorectal cancer screening: Completed 09/10/2012. Repeat every 10 years Mammogram status: Completed 11/21/2018. Repeat every year Bone Density status: Ordered 11/16/2019. Pt provided with contact info and advised to call to schedule appt.  Lung Cancer Screening: (Low Dose CT Chest recommended if Age 60-80 years, 30 pack-year currently smoking OR have quit w/in 15years.) does not qualify.   Lung Cancer Screening Referral: N/A  Additional Screening:  Hepatitis C Screening: does qualify;   Vision Screening: Recommended annual ophthalmology exams for early detection of glaucoma and other disorders of the eye. Is the patient up to date with their annual eye exam?  Yes  Who is the provider or what is the name of the office in which the patient attends annual eye exams? [facility] If pt is not established with a provider, would they like to be referred to a provider to establish care? No .   Dental Screening: Recommended annual dental exams for proper oral hygiene  Community Resource Referral / Chronic Care Management: CRR required this visit?  No   CCM required this visit?  No      Plan:     I have personally reviewed and noted the following in the patient's chart:   . Medical and social history . Use of alcohol, tobacco or illicit drugs  . Current medications and supplements . Functional ability and status . Nutritional status . Physical activity . Advanced directives . List of other physicians . Hospitalizations, surgeries, and ER visits in previous 12 months . Vitals . Screenings to include cognitive, depression, and falls . Referrals and appointments  In addition, I have reviewed and discussed with patient certain preventive protocols, quality metrics, and best practice recommendations. A written personalized care plan for preventive services as well as general preventive health recommendations were provided to patient.      Theodora Blow, LPN   2/70/3500   Nurse Notes: Patient would like to have Hep C added to labs that she had drawn on today.

## 2019-11-16 NOTE — Patient Instructions (Signed)
vasomomtor rhinitis vs allergic cause of stuffiness .  Will notify you  of labs when available.  Continue lifestyle intervention healthy eating and exercise .  Yearly check  Or as needed   Health Maintenance, Female Adopting a healthy lifestyle and getting preventive care are important in promoting health and wellness. Ask your health care provider about:  The right schedule for you to have regular tests and exams.  Things you can do on your own to prevent diseases and keep yourself healthy. What should I know about diet, weight, and exercise? Eat a healthy diet   Eat a diet that includes plenty of vegetables, fruits, low-fat dairy products, and lean protein.  Do not eat a lot of foods that are high in solid fats, added sugars, or sodium. Maintain a healthy weight Body mass index (BMI) is used to identify weight problems. It estimates body fat based on height and weight. Your health care provider can help determine your BMI and help you achieve or maintain a healthy weight. Get regular exercise Get regular exercise. This is one of the most important things you can do for your health. Most adults should:  Exercise for at least 150 minutes each week. The exercise should increase your heart rate and make you sweat (moderate-intensity exercise).  Do strengthening exercises at least twice a week. This is in addition to the moderate-intensity exercise.  Spend less time sitting. Even light physical activity can be beneficial. Watch cholesterol and blood lipids Have your blood tested for lipids and cholesterol at 73 years of age, then have this test every 5 years. Have your cholesterol levels checked more often if:  Your lipid or cholesterol levels are high.  You are older than 73 years of age.  You are at high risk for heart disease. What should I know about cancer screening? Depending on your health history and family history, you may need to have cancer screening at various ages.  This may include screening for:  Breast cancer.  Cervical cancer.  Colorectal cancer.  Skin cancer.  Lung cancer. What should I know about heart disease, diabetes, and high blood pressure? Blood pressure and heart disease  High blood pressure causes heart disease and increases the risk of stroke. This is more likely to develop in people who have high blood pressure readings, are of African descent, or are overweight.  Have your blood pressure checked: ? Every 3-5 years if you are 32-51 years of age. ? Every year if you are 24 years old or older. Diabetes Have regular diabetes screenings. This checks your fasting blood sugar level. Have the screening done:  Once every three years after age 66 if you are at a normal weight and have a low risk for diabetes.  More often and at a younger age if you are overweight or have a high risk for diabetes. What should I know about preventing infection? Hepatitis B If you have a higher risk for hepatitis B, you should be screened for this virus. Talk with your health care provider to find out if you are at risk for hepatitis B infection. Hepatitis C Testing is recommended for:  Everyone born from [birth year] through 1964.  Anyone with known risk factors for hepatitis C. Sexually transmitted infections (STIs)  Get screened for STIs, including gonorrhea and chlamydia, if: ? You are sexually active and are younger than 73 years of age. ? You are older than 73 years of age and your health care provider tells you that you  are at risk for this type of infection. ? Your sexual activity has changed since you were last screened, and you are at increased risk for chlamydia or gonorrhea. Ask your health care provider if you are at risk.  Ask your health care provider about whether you are at high risk for HIV. Your health care provider may recommend a prescription medicine to help prevent HIV infection. If you choose to take medicine to prevent HIV, you  should first get tested for HIV. You should then be tested every 3 months for as long as you are taking the medicine. Pregnancy  If you are about to stop having your period (premenopausal) and you may become pregnant, seek counseling before you get pregnant.  Take 400 to 800 micrograms (mcg) of folic acid every day if you become pregnant.  Ask for birth control (contraception) if you want to prevent pregnancy. Osteoporosis and menopause Osteoporosis is a disease in which the bones lose minerals and strength with aging. This can result in bone fractures. If you are 9 years old or older, or if you are at risk for osteoporosis and fractures, ask your health care provider if you should:  Be screened for bone loss.  Take a calcium or vitamin D supplement to lower your risk of fractures.  Be given hormone replacement therapy (HRT) to treat symptoms of menopause. Follow these instructions at home: Lifestyle  Do not use any products that contain nicotine or tobacco, such as cigarettes, e-cigarettes, and chewing tobacco. If you need help quitting, ask your health care provider.  Do not use street drugs.  Do not share needles.  Ask your health care provider for help if you need support or information about quitting drugs. Alcohol use  Do not drink alcohol if: ? Your health care provider tells you not to drink. ? You are pregnant, may be pregnant, or are planning to become pregnant.  If you drink alcohol: ? Limit how much you use to 0-1 drink a day. ? Limit intake if you are breastfeeding.  Be aware of how much alcohol is in your drink. In the [place] one drink equals one 12 oz bottle of beer (355 mL), one 5 oz glass of wine (148 mL), or one 1 oz glass of hard liquor (44 mL). General instructions  Schedule regular health, dental, and eye exams.  Stay current with your vaccines.  Tell your health care provider if: ? You often feel depressed. ? You have ever been abused or do not feel  safe at home. Summary  Adopting a healthy lifestyle and getting preventive care are important in promoting health and wellness.  Follow your health care provider's instructions about healthy diet, exercising, and getting tested or screened for diseases.  Follow your health care provider's instructions on monitoring your cholesterol and blood pressure. This information is not intended to replace advice given to you by your health care provider. Make sure you discuss any questions you have with your health care provider. Document Revised: 04/02/2018 Document Reviewed: 04/02/2018 Elsevier Patient Education  2020 [employer].

## 2019-11-16 NOTE — Telephone Encounter (Signed)
Ok with me if  Possible

## 2019-11-16 NOTE — Patient Instructions (Signed)
Tina [name] , Thank you for taking time to come for your Medicare Wellness Visit. I appreciate your ongoing commitment to your health goals. Please review the following plan we discussed and let me know if I can assist you in the future.   Screening recommendations/referrals: Colonoscopy: Up to date next due 09/13/2022 Mammogram: Up to date, next due on 11/21/2019 Bone Density: Currently due, ordered placed for this to be performed at [facility] Mammography  Recommended yearly ophthalmology/optometry visit for glaucoma screening and checkup Recommended yearly dental visit for hygiene and checkup  Vaccinations: Influenza vaccine: Up to date, next due 11/2019 Pneumococcal vaccine: completed series Tdap vaccine: Up to date, next due 11/22/2026 Shingles vaccine: completed series    Advanced directives: Copies on file   Conditions/risks identified: None   Next appointment: None    Preventive Care 65 Years and Older, Female Preventive care refers to lifestyle choices and visits with your health care provider that can promote health and wellness. What does preventive care include?  A yearly physical exam. This is also called an annual well check.  Dental exams once or twice a year.  Routine eye exams. Ask your health care provider how often you should have your eyes checked.  Personal lifestyle choices, including:  Daily care of your teeth and gums.  Regular physical activity.  Eating a healthy diet.  Avoiding tobacco and drug use.  Limiting alcohol use.  Practicing safe sex.  Taking low-dose aspirin every day.  Taking vitamin and mineral supplements as recommended by your health care provider. What happens during an annual well check? The services and screenings done by your health care provider during your annual well check will depend on your age, overall health, lifestyle risk factors, and family history of disease. Counseling  Your health care provider may ask you  questions about your:  Alcohol use.  Tobacco use.  Drug use.  Emotional well-being.  Home and relationship well-being.  Sexual activity.  Eating habits.  History of falls.  Memory and ability to understand (cognition).  Work and work Astronomer.  Reproductive health. Screening  You may have the following tests or measurements:  Height, weight, and BMI.  Blood pressure.  Lipid and cholesterol levels. These may be checked every 5 years, or more frequently if you are over 24 years old.  Skin check.  Lung cancer screening. You may have this screening every year starting at age 35 if you have a 30-pack-year history of smoking and currently smoke or have quit within the past 15 years.  Fecal occult blood test (FOBT) of the stool. You may have this test every year starting at age 60.  Flexible sigmoidoscopy or colonoscopy. You may have a sigmoidoscopy every 5 years or a colonoscopy every 10 years starting at age 33.  Hepatitis C blood test.  Hepatitis B blood test.  Sexually transmitted disease (STD) testing.  Diabetes screening. This is done by checking your blood sugar (glucose) after you have not eaten for a while (fasting). You may have this done every 1-3 years.  Bone density scan. This is done to screen for osteoporosis. You may have this done starting at age 15.  Mammogram. This may be done every 1-2 years. Talk to your health care provider about how often you should have regular mammograms. Talk with your health care provider about your test results, treatment options, and if necessary, the need for more tests. Vaccines  Your health care provider may recommend certain vaccines, such as:  Influenza vaccine.  This is recommended every year.  Tetanus, diphtheria, and acellular pertussis (Tdap, Td) vaccine. You may need a Td booster every 10 years.  Zoster vaccine. You may need this after age 65.  Pneumococcal 13-valent conjugate (PCV13) vaccine. One dose is  recommended after age 6.  Pneumococcal polysaccharide (PPSV23) vaccine. One dose is recommended after age 42. Talk to your health care provider about which screenings and vaccines you need and how often you need them. This information is not intended to replace advice given to you by your health care provider. Make sure you discuss any questions you have with your health care provider. Document Released: 05/06/2015 Document Revised: 12/28/2015 Document Reviewed: 02/08/2015 Elsevier Interactive Patient Education  2017 [place] Prevention in the Home Falls can cause injuries. They can happen to people of all ages. There are many things you can do to make your home safe and to help prevent falls. What can I do on the outside of my home?  Regularly fix the edges of walkways and driveways and fix any cracks.  Remove anything that might make you trip as you walk through a door, such as a raised step or threshold.  Trim any bushes or trees on the path to your home.  Use bright outdoor lighting.  Clear any walking paths of anything that might make someone trip, such as rocks or tools.  Regularly check to see if handrails are loose or broken. Make sure that both sides of any steps have handrails.  Any raised decks and porches should have guardrails on the edges.  Have any leaves, snow, or ice cleared regularly.  Use sand or salt on walking paths during winter.  Clean up any spills in your garage right away. This includes oil or grease spills. What can I do in the bathroom?  Use night lights.  Install grab bars by the toilet and in the tub and shower. Do not use towel bars as grab bars.  Use non-skid mats or decals in the tub or shower.  If you need to sit down in the shower, use a plastic, non-slip stool.  Keep the floor dry. Clean up any water that spills on the floor as soon as it happens.  Remove soap buildup in the tub or shower regularly.  Attach bath mats  securely with double-sided non-slip rug tape.  Do not have throw rugs and other things on the floor that can make you trip. What can I do in the bedroom?  Use night lights.  Make sure that you have a light by your bed that is easy to reach.  Do not use any sheets or blankets that are too big for your bed. They should not hang down onto the floor.  Have a firm chair that has side arms. You can use this for support while you get dressed.  Do not have throw rugs and other things on the floor that can make you trip. What can I do in the kitchen?  Clean up any spills right away.  Avoid walking on wet floors.  Keep items that you use a lot in easy-to-reach places.  If you need to reach something above you, use a strong step stool that has a grab bar.  Keep electrical cords out of the way.  Do not use floor polish or wax that makes floors slippery. If you must use wax, use non-skid floor wax.  Do not have throw rugs and other things on the floor that can make  you trip. What can I do with my stairs?  Do not leave any items on the stairs.  Make sure that there are handrails on both sides of the stairs and use them. Fix handrails that are broken or loose. Make sure that handrails are as long as the stairways.  Check any carpeting to make sure that it is firmly attached to the stairs. Fix any carpet that is loose or worn.  Avoid having throw rugs at the top or bottom of the stairs. If you do have throw rugs, attach them to the floor with carpet tape.  Make sure that you have a light switch at the top of the stairs and the bottom of the stairs. If you do not have them, ask someone to add them for you. What else can I do to help prevent falls?  Wear shoes that:  Do not have high heels.  Have rubber bottoms.  Are comfortable and fit you well.  Are closed at the toe. Do not wear sandals.  If you use a stepladder:  Make sure that it is fully opened. Do not climb a closed  stepladder.  Make sure that both sides of the stepladder are locked into place.  Ask someone to hold it for you, if possible.  Clearly mark and make sure that you can see:  Any grab bars or handrails.  First and last steps.  Where the edge of each step is.  Use tools that help you move around (mobility aids) if they are needed. These include:  Canes.  Walkers.  Scooters.  Crutches.  Turn on the lights when you go into a dark area. Replace any light bulbs as soon as they burn out.  Set up your furniture so you have a clear path. Avoid moving your furniture around.  If any of your floors are uneven, fix them.  If there are any pets around you, be aware of where they are.  Review your medicines with your doctor. Some medicines can make you feel dizzy. This can increase your chance of falling. Ask your doctor what other things that you can do to help prevent falls. This information is not intended to replace advice given to you by your health care provider. Make sure you discuss any questions you have with your health care provider. Document Released: 02/03/2009 Document Revised: 09/15/2015 Document Reviewed: 05/14/2014 Elsevier Interactive Patient Education  2017 [employer].

## 2019-11-16 NOTE — Telephone Encounter (Signed)
Patient would like to know if Hep C screening can be added to her labs that was drawn on today. Please advise?

## 2019-11-17 ENCOUNTER — Other Ambulatory Visit: Payer: Self-pay

## 2019-11-17 DIAGNOSIS — Z0000 Encounter for general adult medical examination without abnormal findings: Secondary | ICD-10-CM

## 2019-11-17 NOTE — Telephone Encounter (Signed)
Sent a message to the lab to add on a Hep C and they are faxing paperwork to add this lab on to previous labs done.

## 2019-11-18 LAB — BASIC METABOLIC PANEL WITH GFR
BUN: 19 mg/dL (ref 7–25)
CO2: 27 mmol/L (ref 20–32)
Calcium: 9.5 mg/dL (ref 8.6–10.4)
Chloride: 103 mmol/L (ref 98–110)
Creat: 0.82 mg/dL (ref 0.60–0.93)
GFR, Est African American: 83 mL/min/{1.73_m2} (ref >=60)
GFR, Est Non African American: 71 mL/min/{1.73_m2} (ref >=60)
Glucose, Bld: 98 mg/dL (ref 65–99)
Potassium: 4.6 mmol/L (ref 3.5–5.3)
Sodium: 137 mmol/L (ref 135–146)

## 2019-11-18 LAB — HEMOGLOBIN A1C
Hgb A1c MFr Bld: 5.8 % of total Hgb — ABNORMAL HIGH (ref <5.7)
Mean Plasma Glucose: 120 (calc)
eAG (mmol/L): 6.6 (calc)

## 2019-11-18 LAB — TSH: TSH: 1.53 mIU/L (ref 0.40–4.50)

## 2019-11-18 LAB — TEST AUTHORIZATION

## 2019-11-18 LAB — CBC WITH DIFFERENTIAL/PLATELET
Absolute Monocytes: 594 cells/uL (ref 200–950)
Basophils Absolute: 56 cells/uL (ref 0–200)
Basophils Relative: 0.5 %
Eosinophils Absolute: 101 cells/uL (ref 15–500)
Eosinophils Relative: 0.9 %
HCT: 40.4 % (ref 35.0–45.0)
Hemoglobin: 13.5 g/dL (ref 11.7–15.5)
Lymphs Abs: 1422 cells/uL (ref 850–3900)
MCH: 29.5 pg (ref 27.0–33.0)
MCHC: 33.4 g/dL (ref 32.0–36.0)
MCV: 88.2 fL (ref 80.0–100.0)
MPV: 10.3 fL (ref 7.5–12.5)
Monocytes Relative: 5.3 %
Neutro Abs: 9027 cells/uL — ABNORMAL HIGH (ref 1500–7800)
Neutrophils Relative %: 80.6 %
Platelets: 300 10*3/uL (ref 140–400)
RBC: 4.58 10*6/uL (ref 3.80–5.10)
RDW: 13.2 % (ref 11.0–15.0)
Total Lymphocyte: 12.7 %
WBC: 11.2 10*3/uL — ABNORMAL HIGH (ref 3.8–10.8)

## 2019-11-18 LAB — HEPATIC FUNCTION PANEL
AG Ratio: 1.6 (calc) (ref 1.0–2.5)
ALT: 23 U/L (ref 6–29)
AST: 21 U/L (ref 10–35)
Albumin: 4.2 g/dL (ref 3.6–5.1)
Alkaline phosphatase (APISO): 84 U/L (ref 37–153)
Bilirubin, Direct: 0.1 mg/dL (ref 0.0–0.2)
Globulin: 2.7 g/dL (calc) (ref 1.9–3.7)
Indirect Bilirubin: 0.6 mg/dL (calc) (ref 0.2–1.2)
Total Bilirubin: 0.7 mg/dL (ref 0.2–1.2)
Total Protein: 6.9 g/dL (ref 6.1–8.1)

## 2019-11-18 LAB — LIPID PANEL
Cholesterol: 208 mg/dL — ABNORMAL HIGH (ref <200)
HDL: 68 mg/dL (ref >=50)
LDL Cholesterol (Calc): 126 mg/dL (calc) — ABNORMAL HIGH
Non-HDL Cholesterol (Calc): 140 mg/dL (calc) — ABNORMAL HIGH (ref <130)
Total CHOL/HDL Ratio: 3.1 (calc) (ref <5.0)
Triglycerides: 58 mg/dL (ref <150)

## 2019-11-18 LAB — HEPATITIS C ANTIBODY
Hepatitis C Ab: NONREACTIVE
SIGNAL TO CUT-OFF: 0 (ref <1.00)

## 2019-11-19 NOTE — Progress Notes (Signed)
Normal or in acceptable ranges    not diabetic

## 2019-11-24 DIAGNOSIS — Z1231 Encounter for screening mammogram for malignant neoplasm of breast: Secondary | ICD-10-CM | POA: Diagnosis not present

## 2019-11-24 LAB — HM MAMMOGRAPHY

## 2019-12-10 ENCOUNTER — Ambulatory Visit: Payer: Medicare PPO | Admitting: Family Medicine

## 2019-12-10 ENCOUNTER — Encounter: Payer: Self-pay | Admitting: Family Medicine

## 2019-12-10 ENCOUNTER — Other Ambulatory Visit: Payer: Self-pay

## 2019-12-10 VITALS — BP 110/66 | HR 78 | Ht 63.0 in | Wt 122.0 lb

## 2019-12-10 DIAGNOSIS — M999 Biomechanical lesion, unspecified: Secondary | ICD-10-CM | POA: Diagnosis not present

## 2019-12-10 DIAGNOSIS — M542 Cervicalgia: Secondary | ICD-10-CM | POA: Diagnosis not present

## 2019-12-10 NOTE — Assessment & Plan Note (Signed)
Moderate but seems to be stable.  Does have tightness noted.  No significant discomfort on the left side of the hip anymore.  Discussed with patient about icing regimen and home exercise.  Increase activity slowly.  Follow-up again in 4 to 8 weeks

## 2019-12-10 NOTE — Progress Notes (Signed)
[name] [address] [place] [address] Phone: [phone] Subjective:   [name], am serving as a scribe for Dr. Antoine Primas. This visit occurred during the SARS-CoV-2 public health emergency.  Safety protocols were in place, including screening questions prior to the visit, additional usage of staff PPE, and extensive cleaning of exam room while observing appropriate contact time as indicated for disinfecting solutions.  I'm seeing this patient by the request  of:  Panosh, [name], MD  CC: Back and neck pain follow-up  [id number]  Tina [name] is a 74 y.o. female coming in with complaint of back and neck pain. OMT 11/02/2019. Patient states that she did finally have relief in left piriformis after 4 weeks.   Having neck pain, right side for one week.   Medications patient has been prescribed: Zanaflex  Taking: Intermittently         Reviewed prior external information including notes and imaging from previsou exam, outside providers and external EMR if available.   As well as notes that were available from care everywhere and other healthcare systems.  Past medical history, social, surgical and family history all reviewed in electronic medical record.  No pertanent information unless stated regarding to the chief complaint.   Past Medical History:  Diagnosis Date  . High triglycerides   . Hx of varicella     No Known Allergies   Review of Systems:  No headache, visual changes, nausea, vomiting, diarrhea, constipation, dizziness, abdominal pain, skin rash, fevers, chills, night sweats, weight loss, swollen lymph nodes, body aches, joint swelling, chest pain, shortness of breath, mood changes. POSITIVE muscle aches  Objective  Blood pressure 110/66, pulse 78, height 5\' 3"  (1.6 m), weight 122 lb (55.3 kg), SpO2 98 %.   General: No apparent distress alert and oriented x3 mood and affect normal, dressed appropriately.    HEENT: Pupils equal, extraocular movements intact  Respiratory: Patient's speak in full sentences and does not appear short of breath  Cardiovascular: No lower extremity edema, non tender, no erythema  Neuro: Cranial nerves II through XII are intact, neurovascularly intact in all extremities with 2+ DTRs and 2+ pulses.  Gait normal with good balance and coordination.  MSK:  Non tender with full range of motion and good stability and symmetric strength and tone of shoulders, elbows, wrist, hip, knee and ankles bilaterally.  Back - Normal skin, Spine with normal alignment and no deformity.  No tenderness to vertebral process palpation.  Paraspinous muscles are not tender and without spasm.   Range of motion is full at neck and lumbar sacral regions  Osteopathic findings  C2 flexed rotated and side bent right C6 flexed rotated and side bent left T3 extended rotated and side bent right inhaled rib T8 extended rotated and side bent left L2 flexed rotated and side bent right Sacrum right on right       Assessment and Plan:   Neck pain on left side Moderate but seems to be stable.  Does have tightness noted.  No significant discomfort on the left side of the hip anymore.  Discussed with patient about icing regimen and home exercise.  Increase activity slowly.  Follow-up again in 4 to 8 weeks   Nonallopathic problems  Decision today to treat with OMT was based on Physical Exam  After verbal consent patient was treated with HVLA, ME, FPR techniques in cervical, rib, thoracic, lumbar, and sacral  areas  Patient tolerated the procedure  well with improvement in symptoms  Patient given exercises, stretches and lifestyle modifications  See medications in patient instructions if given  Patient will follow up in 4-8 weeks      The above documentation has been reviewed and is accurate and complete [name], DO       Note: This dictation was prepared with Dragon dictation  along with smaller phrase technology. Any transcriptional errors that result from this process are unintentional.

## 2019-12-10 NOTE — Patient Instructions (Signed)
Continue to enjoy the grandchildren See me again in 6-8 weeks

## 2020-01-14 MED ORDER — AZITHROMYCIN 500 MG PO TABS: 500.0000 mg | ORAL_TABLET | Freq: Every day | ORAL | 0 refills | Status: DC

## 2020-01-14 NOTE — Telephone Encounter (Signed)
I sent in to [facility]  azithromycin best used as needed for traveler diarrhea. Have a great trip and let us know if need other  Help.

## 2020-01-20 NOTE — Progress Notes (Signed)
[name] [address] [place] [address] Phone: [phone] Subjective:   [name], am serving as a scribe for Dr. Antoine Primas. This visit occurred during the SARS-CoV-2 public health emergency.  Safety protocols were in place, including screening questions prior to the visit, additional usage of staff PPE, and extensive cleaning of exam room while observing appropriate contact time as indicated for disinfecting solutions.   I'm seeing this patient by the request  of:  Panosh, [name], MD  CC: Neck pain and back pain follow-up  [id number]  Tina [name] is a 74 y.o. female coming in with complaint of back and neck pain. OMT 12/10/2019. Patient states some mild tightness.  Will be traveling.  Patient describes the pain as a dull, throbbing aching pain.  Nothing severe.  Patient wants to make sure she is feeling good before her trip.  Medications patient has been prescribed: Zanaflex  Taking: Has not taken any since          Reviewed prior external information including notes and imaging from previsou exam, outside providers and external EMR if available.   As well as notes that were available from care everywhere and other healthcare systems.  Past medical history, social, surgical and family history all reviewed in electronic medical record.  No pertanent information unless stated regarding to the chief complaint.   Past Medical History:  Diagnosis Date  . High triglycerides   . Hx of varicella     No Known Allergies   Review of Systems:  No headache, visual changes, nausea, vomiting, diarrhea, constipation, dizziness, abdominal pain, skin rash, fevers, chills, night sweats, weight loss, swollen lymph nodes, body aches, joint swelling, chest pain, shortness of breath, mood changes. POSITIVE muscle aches  Objective  Blood pressure 104/82, pulse 77, height 5\' 3"  (1.6 m), weight 123 lb (55.8 kg), SpO2 97 %.   General: No  apparent distress alert and oriented x3 mood and affect normal, dressed appropriately.  HEENT: Pupils equal, extraocular movements intact  Respiratory: Patient's speak in full sentences and does not appear short of breath  Cardiovascular: No lower extremity edema, non tender, no erythema  Neuro: Cranial nerves II through XII are intact, neurovascularly intact in all extremities with 2+ DTRs and 2+ pulses.  Gait normal with good balance and coordination.  MSK:  Non tender with full range of motion and good stability and symmetric strength and tone of shoulders, elbows, wrist, hip, knee and ankles bilaterally.  Back - Normal skin, Spine with normal alignment and no deformity.  No tenderness to vertebral process palpation.  Paraspinous muscles are not tender and without spasm.   Range of motion is full at neck and lumbar sacral regions  Osteopathic findings   C6 flexed rotated and side bent left T3 extended rotated and side bent left inhaled rib T7 extended rotated and side bent left L2 flexed rotated and side bent right Sacrum right on right       Assessment and Plan:  Neck pain on left side Multifactorial.  Chronic problem is stable.  Discussed icing regimen and home exercise, which activities to doing which wants to avoid.  Patient will be traveling.  We discussed different stretches she can do on her trip follow-up with me again in 4 to 8 weeks With patient traveling we did discuss some Zanaflex.  Nonallopathic problems  Decision today to treat with OMT was based on Physical Exam  After verbal consent patient was treated with  HVLA, ME, FPR techniques in cervical, rib, thoracic, lumbar, and sacral  areas  Patient tolerated the procedure well with improvement in symptoms  Patient given exercises, stretches and lifestyle modifications  See medications in patient instructions if given  Patient will follow up in 4-8 weeks      The above documentation has been reviewed and is  accurate and complete [name], DO       Note: This dictation was prepared with Dragon dictation along with smaller phrase technology. Any transcriptional errors that result from this process are unintentional.

## 2020-01-21 ENCOUNTER — Ambulatory Visit (INDEPENDENT_AMBULATORY_CARE_PROVIDER_SITE_OTHER): Payer: Medicare PPO | Admitting: Family Medicine

## 2020-01-21 ENCOUNTER — Encounter: Payer: Self-pay | Admitting: Family Medicine

## 2020-01-21 ENCOUNTER — Other Ambulatory Visit: Payer: Self-pay

## 2020-01-21 VITALS — BP 104/82 | HR 77 | Ht 63.0 in | Wt 123.0 lb

## 2020-01-21 DIAGNOSIS — M542 Cervicalgia: Secondary | ICD-10-CM | POA: Diagnosis not present

## 2020-01-21 DIAGNOSIS — M999 Biomechanical lesion, unspecified: Secondary | ICD-10-CM

## 2020-01-21 NOTE — Patient Instructions (Signed)
Have a great time Want to see pictures See me in 6 weeks

## 2020-01-21 NOTE — Assessment & Plan Note (Signed)
Multifactorial.  Chronic problem is stable.  Discussed icing regimen and home exercise, which activities to doing which wants to avoid.  Patient will be traveling.  We discussed different stretches she can do on her trip follow-up with me again in 4 to 8 weeks

## 2020-01-29 ENCOUNTER — Ambulatory Visit: Payer: Medicare PPO

## 2020-03-02 NOTE — Progress Notes (Signed)
[name] [address] [place] [address] Phone: [phone] Subjective:   Wynema Birch, am serving as a scribe for Dr. Antoine Primas. This visit occurred during the SARS-CoV-2 public health emergency.  Safety protocols were in place, including screening questions prior to the visit, additional usage of staff PPE, and extensive cleaning of exam room while observing appropriate contact time as indicated for disinfecting solutions.   I'm seeing this patient by the request  of:  Panosh, [name], MD  CC: Neck pain follow-up  [id number]  Isley Weisheit is a 74 y.o. female coming in with complaint of back and neck pain. OMT 01/21/2020. Patient states All things considered she feels pretty good can just tell she is ready for manipulation.  Medications patient has been prescribed: Zanaflex  Taking: Yes         Reviewed prior external information including notes and imaging from previsou exam, outside providers and external EMR if available.   As well as notes that were available from care everywhere and other healthcare systems.  Past medical history, social, surgical and family history all reviewed in electronic medical record.  No pertanent information unless stated regarding to the chief complaint.   Past Medical History:  Diagnosis Date  . High triglycerides   . Hx of varicella       Review of Systems:  No headache, visual changes, nausea, vomiting, diarrhea, constipation, dizziness, abdominal pain, skin rash, fevers, chills, night sweats, weight loss, swollen lymph nodes, body aches, joint swelling, chest pain, shortness of breath, mood changes. POSITIVE muscle aches  Objective  Blood pressure 110/70, pulse 62, height 5\' 3"  (1.6 m), weight 124 lb (56.2 kg), SpO2 99 %.   General: No apparent distress alert and oriented x3 mood and affect normal, dressed appropriately.  HEENT: Pupils equal, extraocular movements intact    Respiratory: Patient's speak in full sentences and does not appear short of breath  Cardiovascular: No lower extremity edema, non tender, no erythema  Neuro: Cranial nerves II through XII are intact, neurovascularly intact in all extremities with 2+ DTRs and 2+ pulses.  Gait normal with good balance and coordination.  MSK:  Non tender with full range of motion and good stability and symmetric strength and tone of shoulders, elbows, wrist, hip, knee and ankles bilaterally.  Neck in the parascapular area still tight left side greater than right.  Patient has negative Spurling's.  5 5 strength of the upper extremities.  No significant scapular dyskinesis noted with a little more pain in the T3-T4 area left side.  Osteopathic findings  C6 flexed rotated and side bent left T3 extended rotated and side bent left inhaled rib L2 flexed rotated and side bent right Sacrum right on right       Assessment and Plan:  Neck pain on left side Chronic, stable.  Doing relatively well.  Discussed icing regimen and home exercises.  Increase activity.  Patient will continue to work on .  Follow-up again in 4 to 8 weeks    Nonallopathic problems  Decision today to treat with OMT was based on Physical Exam  After verbal consent patient was treated with HVLA, ME, FPR techniques in cervical, rib, thoracic, lumbar, and sacral  areas  Patient tolerated the procedure well with improvement in symptoms  Patient given exercises, stretches and lifestyle modifications  See medications in patient instructions if given  Patient will follow up in 4-8 weeks      The above documentation  has been reviewed and is accurate and complete Penina Reisner M Estefanie Cornforth, DO       Note: This dictation was prepared with Dragon dictation along with smaller phrase technology. Any transcriptional errors that result from this process are unintentional.         

## 2020-03-03 ENCOUNTER — Other Ambulatory Visit: Payer: Self-pay

## 2020-03-03 ENCOUNTER — Ambulatory Visit: Payer: Medicare PPO | Admitting: Family Medicine

## 2020-03-03 ENCOUNTER — Encounter: Payer: Self-pay | Admitting: Family Medicine

## 2020-03-03 VITALS — BP 110/70 | HR 62 | Ht 63.0 in | Wt 124.0 lb

## 2020-03-03 DIAGNOSIS — M542 Cervicalgia: Secondary | ICD-10-CM

## 2020-03-03 DIAGNOSIS — M999 Biomechanical lesion, unspecified: Secondary | ICD-10-CM

## 2020-03-03 NOTE — Patient Instructions (Addendum)
Good to see you  Happy [place] day! See me again in 6-8 weeks

## 2020-03-03 NOTE — Assessment & Plan Note (Signed)
Chronic, stable.  Doing relatively well.  Discussed icing regimen and home exercises.  Increase activity.  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  Follow-up again in 4 to 8 weeks

## 2020-03-15 DIAGNOSIS — H40013 Open angle with borderline findings, low risk, bilateral: Secondary | ICD-10-CM | POA: Diagnosis not present

## 2020-04-20 NOTE — Progress Notes (Signed)
[name] [address] [place] [address] Phone: [phone] Subjective:   Tina [name], am serving as a scribe for Dr. Antoine [name]. This visit occurred during the SARS-CoV-2 public health emergency.  Safety protocols were in place, including screening questions prior to the visit, additional usage of staff PPE, and extensive cleaning of exam room while observing appropriate contact time as indicated for disinfecting solutions.   I'm seeing this patient by the request  of:  Panosh, Tina Mends, MD  CC: Neck and back pain follow-up, new onset knee pain [id number]  Tina [name] is a 74 y.o. female coming in with complaint of back and neck pain. OMT 03/03/2020. Patient states that her cervical spine pain continues throughout each day. Did feel ok on her trip to [facility].  Patient states that it was fantastic.  No significant difficulty.  Also has new issue today. Bilateral knee pain over anterior aspect, patellar tendon with prolonged weight bearing. States that she does not typically have pain in knee joint.  Patient states that sometimes there is an audible popping or feels like there is some grinding occurring.  Has not needed any medications.  Wants to know if okay if she continues to walk  Medications patient has been prescribed: None          Reviewed prior external information including notes and imaging from previsou exam, outside providers and external EMR if available.   As well as notes that were available from care everywhere and other healthcare systems.  Past medical history, social, surgical and family history all reviewed in electronic medical record.  No pertanent information unless stated regarding to the chief complaint.   Past Medical History:  Diagnosis Date  . High triglycerides   . Hx of varicella     No Known Allergies   Review of Systems:  No headache, visual changes, nausea, vomiting, diarrhea, constipation,  dizziness, abdominal pain, skin rash, fevers, chills, night sweats, weight loss, swollen lymph nodes, body aches, joint swelling, chest pain, shortness of breath, mood changes. POSITIVE muscle aches  Objective  Blood pressure 110/70, pulse 74, height 5\' 3"  (1.6 m), weight 124 lb (56.2 kg), SpO2 99 %.   General: No apparent distress alert and oriented x3 mood and affect normal, dressed appropriately.  HEENT: Pupils equal, extraocular movements intact  Respiratory: Patient's speak in full sentences and does not appear short of breath  Cardiovascular: No lower extremity edema, non tender, no erythema  Neck exam does have some mild loss of lordosis, some tenderness to palpation on the right greater than left.  Patient does have mild limited sidebending bilaterally.  Very mild crepitus with range of motion.  Knee exam bilaterally shows the patient does have mild lateral tracking of the patella left greater than right.  Mild positive patellar grind on the left.  Very mild amount of effusion noted on the patella tendon.  Nontender though on palpation in the area.  Full strength.  No significant instability with valgus and varus force  Osteopathic findings  C6 flexed rotated and side bent left T3 extended rotated and side bent right inhaled rib T8 extended rotated and side bent left L3 flexed rotated and side bent right Sacrum right on right       Assessment and Plan: Neck pain on left side Chronic problem but overall stable.  Does have some mild tightness noted.  Patient states that she is doing relatively well.  Continuing to remain active.  Macular  degeneration.  Does have the muscle relaxer at bedtime.  Follow-up again in 4 to 8 weeks  Patellofemoral syndrome of both knees Left greater than right.  Discussed posture and ergonomics, work with Event organiser, and home exercises.  Standing x-rays ordered today.  Discussed icing regimen and home exercises.  Follow-up again in 4 to 8 weeks      Nonallopathic problems  Decision today to treat with OMT was based on Physical Exam  After verbal consent patient was treated with HVLA, ME, FPR techniques in cervical, rib, thoracic, lumbar, and sacral  areas  Patient tolerated the procedure well with improvement in symptoms  Patient given exercises, stretches and lifestyle modifications  See medications in patient instructions if given  Patient will follow up in 4-8 weeks      The above documentation has been reviewed and is accurate and complete Tina Saa, DO       Note: This dictation was prepared with Dragon dictation along with smaller phrase technology. Any transcriptional errors that result from this process are unintentional.

## 2020-04-21 ENCOUNTER — Encounter: Payer: Self-pay | Admitting: Family Medicine

## 2020-04-21 ENCOUNTER — Ambulatory Visit (INDEPENDENT_AMBULATORY_CARE_PROVIDER_SITE_OTHER): Payer: Medicare PPO

## 2020-04-21 ENCOUNTER — Ambulatory Visit: Payer: Medicare PPO | Admitting: Family Medicine

## 2020-04-21 ENCOUNTER — Other Ambulatory Visit: Payer: Self-pay

## 2020-04-21 VITALS — BP 110/70 | HR 74 | Ht 63.0 in | Wt 124.0 lb

## 2020-04-21 DIAGNOSIS — M542 Cervicalgia: Secondary | ICD-10-CM

## 2020-04-21 DIAGNOSIS — M25561 Pain in right knee: Secondary | ICD-10-CM | POA: Diagnosis not present

## 2020-04-21 DIAGNOSIS — M222X1 Patellofemoral disorders, right knee: Secondary | ICD-10-CM | POA: Diagnosis not present

## 2020-04-21 DIAGNOSIS — M25562 Pain in left knee: Secondary | ICD-10-CM

## 2020-04-21 DIAGNOSIS — M999 Biomechanical lesion, unspecified: Secondary | ICD-10-CM

## 2020-04-21 DIAGNOSIS — M222X2 Patellofemoral disorders, left knee: Secondary | ICD-10-CM

## 2020-04-21 NOTE — Patient Instructions (Signed)
Xray today Exercises 3x a week See me in 6-7 weeks

## 2020-04-21 NOTE — Assessment & Plan Note (Signed)
Chronic problem but overall stable.  Does have some mild tightness noted.  Patient states that she is doing relatively well.  Continuing to remain active.  Macular degeneration.  Does have the muscle relaxer at bedtime.  Follow-up again in 4 to 8 weeks

## 2020-04-21 NOTE — Assessment & Plan Note (Signed)
Left greater than right.  Discussed posture and ergonomics, work with Event organiser, and home exercises.  Standing x-rays ordered today.  Discussed icing regimen and home exercises.  Follow-up again in 4 to 8 weeks

## 2020-06-01 NOTE — Progress Notes (Unsigned)
[name] [address] [place] [address] Phone: [phone] Subjective:   I Tina [name] am serving as a Neurosurgeon for Dr. Antoine Primas.  This visit occurred during the SARS-CoV-2 public health emergency.  Safety protocols were in place, including screening questions prior to the visit, additional usage of staff PPE, and extensive cleaning of exam room while observing appropriate contact time as indicated for disinfecting solutions.   I'm seeing this patient by the request  of:  Panosh, [name], MD  CC: Patient does have back and neck pain  [id number]   04/21/2020 Left greater than right.  Discussed posture and ergonomics, work with Event organiser, and home exercises.  Standing x-rays ordered today.  Discussed icing regimen and home exercises.  Follow-up again in 4 to 8 weeks  Update 06/02/2020 Tina [name] is a 75 y.o. female coming in with complaint of back and neck pain. OMT 04/21/2020. Also seen for bilateral knee pain. Patient states she is doing well. Feet are bothering her today. Potentially a neuroma that she has discussed before. Wants more education on what she should do. Worse on the right.  Patient has had this previously.  Has had custom orthotics made but is not wearing them at the moment because she is waiting for new shoes.  Patient did wear different shoes          Reviewed prior external information including notes and imaging from previsou exam, outside providers and external EMR if available.   As well as notes that were available from care everywhere and other healthcare systems.  Past medical history, social, surgical and family history all reviewed in electronic medical record.  No pertanent information unless stated regarding to the chief complaint.   Past Medical History:  Diagnosis Date  . High triglycerides   . Hx of varicella     No Known Allergies   Review of Systems:  No headache, visual changes,  nausea, vomiting, diarrhea, constipation, dizziness, abdominal pain, skin rash, fevers, chills, night sweats, weight loss, swollen lymph nodes, body aches, joint swelling, chest pain, shortness of breath, mood changes. POSITIVE muscle aches  Objective  Blood pressure 112/84, pulse 77, height 5\' 3"  (1.6 m), weight 123 lb (55.8 kg), SpO2 100 %.   General: No apparent distress alert and oriented x3 mood and affect normal, dressed appropriately.  HEENT: Pupils equal, extraocular movements intact  Respiratory: Patient's speak in full sentences and does not appear short of breath  Cardiovascular: No lower extremity edema, non tender, no erythema   Gait normal with good balance and coordination.  MSK:  Non tender with full range of motion and good stability and symmetric strength and tone of shoulders, elbows, wrist, hip, knee and ankles bilaterally.  Neck exam does have some mild tightness.  Seems to be more on the paraspinal musculature.  Mild limited range of motion of rotation and sidebending bilaterally.  Negative Spurling's though noted.  Lacks last 5 degrees of extension.  More discomfort in the parascapular region on the left side  Right foot exam does have a positive squeeze test.  Breakdown of the transverse arch noted.  Patient does not have pain over the metatarsals.  Left foot though does have some tenderness over the third and fourth metatarsals proximally.  Mild pain with squeeze test as well.  Breakdown of the transverse arch noted.  Trace amount of swelling noted of the dorsal aspect of the foot.  Osteopathic findings  C6 flexed rotated and  side bent left T6 extended rotated and side bent left inhaled rib L2 flexed rotated and side bent right Sacrum right on right       Assessment and Plan:  Metatarsalgia of both feet Patient actually has signs and symptoms concerning for a right sided neuroma and a left-sided stress fracture.  Patient is in custom orthotics and patient will  bring them in at next visit.  Likely need new shoes.  Started on once weekly vitamin D for the next 4 weeks.  Patient has had stress reactions previously.  Discussed recovery sandals in the house.  Follow-up again in 4 to 6 weeks worsening pain will consider x-rays and ultrasound at follow-up  Neck pain on left side Stable overall.  Discussed icing regimen and home exercises.  Continue to work on posture.  Does have Zanaflex at nighttime if necessary.  Follow-up again in 4 to 6 weeks    Nonallopathic problems  Decision today to treat with OMT was based on Physical Exam  After verbal consent patient was treated with HVLA, ME, FPR techniques in cervical, rib, thoracic, lumbar, and sacral  areas  Patient tolerated the procedure well with improvement in symptoms  Patient given exercises, stretches and lifestyle modifications  See medications in patient instructions if given  Patient will follow up in 4-8 weeks      The above documentation has been reviewed and is accurate and complete [name], DO       Note: This dictation was prepared with Dragon dictation along with smaller phrase technology. Any transcriptional errors that result from this process are unintentional.

## 2020-06-02 ENCOUNTER — Other Ambulatory Visit: Payer: Self-pay

## 2020-06-02 ENCOUNTER — Ambulatory Visit: Payer: Medicare PPO | Admitting: Family Medicine

## 2020-06-02 ENCOUNTER — Encounter: Payer: Self-pay | Admitting: Family Medicine

## 2020-06-02 VITALS — BP 112/84 | HR 77 | Ht 63.0 in | Wt 123.0 lb

## 2020-06-02 DIAGNOSIS — M7742 Metatarsalgia, left foot: Secondary | ICD-10-CM | POA: Diagnosis not present

## 2020-06-02 DIAGNOSIS — M542 Cervicalgia: Secondary | ICD-10-CM | POA: Diagnosis not present

## 2020-06-02 DIAGNOSIS — M999 Biomechanical lesion, unspecified: Secondary | ICD-10-CM | POA: Diagnosis not present

## 2020-06-02 DIAGNOSIS — M7741 Metatarsalgia, right foot: Secondary | ICD-10-CM | POA: Diagnosis not present

## 2020-06-02 MED ORDER — VITAMIN D (ERGOCALCIFEROL) 1.25 MG (50000 UNIT) PO CAPS
50000.0000 [IU] | ORAL_CAPSULE | ORAL | 0 refills | Status: DC
Start: 2020-06-02 — End: 2022-01-03

## 2020-06-02 NOTE — Assessment & Plan Note (Signed)
Stable overall.  Discussed icing regimen and home exercises.  Continue to work on posture.  Does have Zanaflex at nighttime if necessary.  Follow-up again in 4 to 6 weeks

## 2020-06-02 NOTE — Assessment & Plan Note (Signed)
Patient actually has signs and symptoms concerning for a right sided neuroma and a left-sided stress fracture.  Patient is in custom orthotics and patient will bring them in at next visit.  Likely need new shoes.  Started on once weekly vitamin D for the next 4 weeks.  Patient has had stress reactions previously.  Discussed recovery sandals in the house.  Follow-up again in 4 to 6 weeks worsening pain will consider x-rays and ultrasound at follow-up

## 2020-06-02 NOTE — Patient Instructions (Addendum)
Good to see you Transverse arch exercises Once weekly vitamin D for 4 weeks K2 100 mg daily for 1 months Hoka and oofos recover sandals Hopefully new shoes come soon See me again in 4-6 weeks

## 2020-06-16 DIAGNOSIS — H401131 Primary open-angle glaucoma, bilateral, mild stage: Secondary | ICD-10-CM | POA: Diagnosis not present

## 2020-06-16 DIAGNOSIS — H0100A Unspecified blepharitis right eye, upper and lower eyelids: Secondary | ICD-10-CM | POA: Diagnosis not present

## 2020-07-06 NOTE — Progress Notes (Signed)
[name] [address] [place] [address] Phone: [phone] Subjective:   I [name] am serving as a Neurosurgeon for Dr. Antoine Primas.  This visit occurred during the SARS-CoV-2 public health emergency.  Safety protocols were in place, including screening questions prior to the visit, additional usage of staff PPE, and extensive cleaning of exam room while observing appropriate contact time as indicated for disinfecting solutions.   I'm seeing this patient by the request  of:  Panosh, [name], MD  CC: Back and neck pain follow-up  [id number]  Tina [name] is a 75 y.o. female coming in with complaint of back and neck pain. OMT 06/02/2020. Patient states her neck is stiff and tight today. Spend all day yesterday in the ER with her dad.  Patient states that otherwise she has been doing relatively well.  States that it is just more tension and knows that stress is playing a little bit of a role.  Medications patient has been prescribed: Vit D  Taking: Yes         Reviewed prior external information including notes and imaging from previsou exam, outside providers and external EMR if available.   As well as notes that were available from care everywhere and other healthcare systems.  Past medical history, social, surgical and family history all reviewed in electronic medical record.  No pertanent information unless stated regarding to the chief complaint.   Past Medical History:  Diagnosis Date  . High triglycerides   . Hx of varicella     No Known Allergies   Review of Systems:  No headache, visual changes, nausea, vomiting, diarrhea, constipation, dizziness, abdominal pain, skin rash, fevers, chills, night sweats, weight loss, swollen lymph nodes, body aches, joint swelling, chest pain, shortness of breath, mood changes. POSITIVE muscle aches  Objective  Blood pressure (!) 148/80, pulse 65, height 5\' 3"  (1.6 m), weight 125 lb (56.7  kg), SpO2 100 %.   General: No apparent distress alert and oriented x3 mood and affect normal, dressed appropriately.  HEENT: Pupils equal, extraocular movements intact  Respiratory: Patient's speak in full sentences and does not appear short of breath  Cardiovascular: No lower extremity edema, non tender, no erythema  Gait normal with good balance and coordination.  MSK: Mild arthritic changes of multiple joints.  Neck exam does have some mild loss of lordosis.  Tightness noted in the left trapezius today.  Negative Spurling's.  Tightness in the paraspinal musculature and the medial aspect of the scapula.  No midline tenderness.  Osteopathic findings  C2 flexed rotated and side bent right C6 flexed rotated and side bent left T9 extended rotated and side bent left inhaled. L2 flexed rotated and side bent right Sacrum right on right       Assessment and Plan:  Neck pain on left side Neck pain on the left side.  Discussed icing regimen and home exercises.  Patient did have more tightness noted today.  Does not have arthritic changes.  No significant change in management.  Stress is playing a role.  Discussed the Zanaflex.  May need to consider the possibility again of gabapentin.  Follow-up with me again 4 to 6 weeks    Nonallopathic problems  Decision today to treat with OMT was based on Physical Exam  After verbal consent patient was treated with HVLA, ME, FPR techniques in cervical, rib, thoracic, lumbar, and sacral  areas  Patient tolerated the procedure well with improvement in symptoms  Patient given exercises, stretches and lifestyle modifications  See medications in patient instructions if given  Patient will follow up in 4-6 weeks      The above documentation has been reviewed and is accurate and complete [name], DO       Note: This dictation was prepared with Dragon dictation along with smaller phrase technology. Any transcriptional errors that  result from this process are unintentional.

## 2020-07-07 ENCOUNTER — Other Ambulatory Visit: Payer: Self-pay

## 2020-07-07 ENCOUNTER — Encounter: Payer: Self-pay | Admitting: Family Medicine

## 2020-07-07 ENCOUNTER — Ambulatory Visit: Payer: Medicare PPO | Admitting: Family Medicine

## 2020-07-07 VITALS — BP 148/80 | HR 65 | Ht 63.0 in | Wt 125.0 lb

## 2020-07-07 DIAGNOSIS — M999 Biomechanical lesion, unspecified: Secondary | ICD-10-CM

## 2020-07-07 DIAGNOSIS — M542 Cervicalgia: Secondary | ICD-10-CM

## 2020-07-07 DIAGNOSIS — H409 Unspecified glaucoma: Secondary | ICD-10-CM | POA: Insufficient documentation

## 2020-07-07 NOTE — Patient Instructions (Signed)
Good to see you Thinking of you and your dad Try to find time for your self Continue the exercises See me again in 6-8 weeks

## 2020-07-07 NOTE — Assessment & Plan Note (Signed)
Neck pain on the left side.  Discussed icing regimen and home exercises.  Patient did have more tightness noted today.  Does not have arthritic changes.  No significant change in management.  Stress is playing a role.  Discussed the Zanaflex.  May need to consider the possibility again of gabapentin.  Follow-up with me again 4 to 6 weeks

## 2020-07-15 DIAGNOSIS — H401131 Primary open-angle glaucoma, bilateral, mild stage: Secondary | ICD-10-CM | POA: Diagnosis not present

## 2020-08-17 NOTE — Progress Notes (Signed)
[name] [address] [place] [address] Phone: [phone] Subjective:   [name], am serving as a scribe for Dr. Antoine Primas. This visit occurred during the SARS-CoV-2 public health emergency.  Safety protocols were in place, including screening questions prior to the visit, additional usage of staff PPE, and extensive cleaning of exam room while observing appropriate contact time as indicated for disinfecting solutions.   I'm seeing this patient by the request  of:  Panosh, [name], MD  CC:  Back and neck pain follow-up [id number]  Tina [name] is a 75 y.o. female coming in with complaint of back and neck pain. OMT 07/07/2020. Patient states that she had some stress related neck pain that continues to give her some discomfort.  Patient has been with her dad who is having his 99[age] birthday tomorrow.  Has been in the hospital for quite some time though.  Now they are discussing movement from independent living to potential assisted living or skilled nursing facility.  Feels like the normal pain.  Has not been using the Zanaflex very much but wondered it is necessary she does feel it to be helpful.  Medications patient has been prescribed: Vit D, Zanaflex  Taking:         Past medical history, social, surgical and family history all reviewed in electronic medical record.  No pertanent information unless stated regarding to the chief complaint.   Past Medical History:  Diagnosis Date  . High triglycerides   . Hx of varicella     No Known Allergies   Review of Systems:  No headache, visual changes, nausea, vomiting, diarrhea, constipation, dizziness, abdominal pain, skin rash, fevers, chills, night sweats, weight loss, swollen lymph nodes, body aches, joint swelling, chest pain, shortness of breath, mood changes. POSITIVE muscle aches  Objective  Blood pressure 122/82, pulse 62, height 5\' 3"  (1.6 m), weight 123 lb (55.8 kg),  SpO2 99 %.   General: No apparent distress alert and oriented x3 mood and affect normal, dressed appropriately.  HEENT: Pupils equal, extraocular movements intact  Respiratory: Patient's speak in full sentences and does not appear short of breath  Cardiovascular: No lower extremity edema, non tender, no erythema  Gait normal with good balance and coordination.  MSK:  Non tender with full range of motion and good stability and symmetric strength and tone of shoulders, elbows, wrist, hip, knee and ankles bilaterally.  Back -back exam does have some mild loss of lordosis.  Mild tightness noted over the right sacroiliac joint. Neck exam still has mild tenderness to palpation over the left side of the neck today.  Seems to also have tightness noted in the left parascapular region.  Osteopathic findings  C3 flexed rotated and side bent left T9 extended rotated and side bent left with inhaled rib L2 flexed rotated and side bent right Sacrum right on right Pelvic shear noted on the right      Assessment and Plan:  Neck pain on left side Chronic problem with exacerbation.  Discussed icing regimen and home exercises, discussed which activities to do which wants to avoid.  Patient is doing relatively well with the home exercises when possible.  Has had some increasing stress secondary to taking care of her father who is turning 71 tomorrow.  He does take Zanaflex.  Uses this more as needed.  Follow-up with me again in 5 to 6 weeks    Nonallopathic problems  Decision today to treat with  OMT was based on Physical Exam  After verbal consent patient was treated with HVLA, ME, FPR techniques in cervical, rib, thoracic, lumbar, and sacral  areas  Patient tolerated the procedure well with improvement in symptoms  Patient given exercises, stretches and lifestyle modifications  See medications in patient instructions if given  Patient will follow up in 4-8 weeks      The above documentation  has been reviewed and is accurate and complete [name], DO       Note: This dictation was prepared with Dragon dictation along with smaller phrase technology. Any transcriptional errors that result from this process are unintentional.

## 2020-08-18 ENCOUNTER — Encounter: Payer: Self-pay | Admitting: Family Medicine

## 2020-08-18 ENCOUNTER — Ambulatory Visit: Payer: Medicare PPO | Admitting: Family Medicine

## 2020-08-18 ENCOUNTER — Other Ambulatory Visit: Payer: Self-pay

## 2020-08-18 VITALS — BP 122/82 | HR 62 | Ht 63.0 in | Wt 123.0 lb

## 2020-08-18 DIAGNOSIS — M542 Cervicalgia: Secondary | ICD-10-CM

## 2020-08-18 DIAGNOSIS — M9908 Segmental and somatic dysfunction of rib cage: Secondary | ICD-10-CM | POA: Diagnosis not present

## 2020-08-18 DIAGNOSIS — M9903 Segmental and somatic dysfunction of lumbar region: Secondary | ICD-10-CM

## 2020-08-18 DIAGNOSIS — M9901 Segmental and somatic dysfunction of cervical region: Secondary | ICD-10-CM

## 2020-08-18 DIAGNOSIS — M9904 Segmental and somatic dysfunction of sacral region: Secondary | ICD-10-CM | POA: Diagnosis not present

## 2020-08-18 DIAGNOSIS — M9902 Segmental and somatic dysfunction of thoracic region: Secondary | ICD-10-CM | POA: Diagnosis not present

## 2020-08-18 NOTE — Assessment & Plan Note (Signed)
Chronic problem with exacerbation.  Discussed icing regimen and home exercises, discussed which activities to do which wants to avoid.  Patient is doing relatively well with the home exercises when possible.  Has had some increasing stress secondary to taking care of her father who is turning 46 tomorrow.  He does take Zanaflex.  Uses this more as needed.  Follow-up with me again in 5 to 6 weeks

## 2020-08-18 NOTE — Patient Instructions (Signed)
Thanks for book info Happy Birthday to your dad Keep doing exercises  See me in 6 weeks

## 2020-08-31 ENCOUNTER — Ambulatory Visit: Payer: Medicare PPO | Attending: Internal Medicine

## 2020-08-31 ENCOUNTER — Other Ambulatory Visit: Payer: Self-pay

## 2020-08-31 ENCOUNTER — Other Ambulatory Visit (HOSPITAL_BASED_OUTPATIENT_CLINIC_OR_DEPARTMENT_OTHER): Payer: Self-pay

## 2020-08-31 DIAGNOSIS — Z23 Encounter for immunization: Secondary | ICD-10-CM

## 2020-08-31 MED ORDER — COVID-19 MRNA VACC (MODERNA) 100 MCG/0.5ML IM SUSP
INTRAMUSCULAR | 0 refills | Status: DC
Start: 2020-08-31 — End: 2022-01-03
  Filled 2020-08-31: qty 0.25, 1d supply, fill #0

## 2020-08-31 NOTE — Progress Notes (Signed)
   Covid-[facility] Vaccination [facility]  Name:  Tina [name]    MRN: [id number] DOB: 18-Feb-1946  08/31/2020  Tina [name] was observed post Covid-19 immunization for 15 minutes without incident. She was provided with Vaccine Information Sheet and instruction to access the V-Safe system.   Tina [name] was instructed to call 911 with any severe reactions post vaccine: Marland Kitchen Difficulty breathing  . Swelling of face and throat  . A fast heartbeat  . A bad rash all over body  . Dizziness and weakness   Immunizations Administered    Name Date Dose VIS Date Route   Moderna Covid-19 Booster Vaccine 08/31/2020 12:40 PM 0.25 mL 02/10/2020 Intramuscular   Manufacturer: Moderna   Lot: [id number]   NDC: 07680-881-10

## 2020-09-12 DIAGNOSIS — Z20822 Contact with and (suspected) exposure to covid-19: Secondary | ICD-10-CM | POA: Diagnosis not present

## 2020-09-28 NOTE — Progress Notes (Signed)
[name] [address] [place] [address] Phone: [phone] Subjective:   I Tina [name] am serving as a Neurosurgeon for Dr. Antoine [name].  This visit occurred during the SARS-CoV-2 public health emergency.  Safety protocols were in place, including screening questions prior to the visit, additional usage of staff PPE, and extensive cleaning of exam room while observing appropriate contact time as indicated for disinfecting solutions.   I'm seeing this patient by the request  of:  Panosh, Tina Mends, MD  CC: Neck pain, foot pain  [id number]  Tina [name] is a 75 y.o. female coming in with complaint of back, left foot and neck pain. OMT 08/18/2020. Patient states Not bad today with the neck and back. Left foot feels like overuse with intense pain in the metatarsals. Took Ibuprofen 3 times a day. Pain is radiating to the midfoot. States it started when she wore a old pair and shoes and was on her feet a lot. Foot pain has been going on for 2 weeks.  Medications patient has been prescribed: Vit D  Taking:         Reviewed prior external information including notes and imaging from previsou exam, outside providers and external EMR if available.   As well as notes that were available from care everywhere and other healthcare systems.  Past medical history, social, surgical and family history all reviewed in electronic medical record.  No pertanent information unless stated regarding to the chief complaint.   Past Medical History:  Diagnosis Date   High triglycerides    Hx of varicella     No Known Allergies   Review of Systems:  No headache, visual changes, nausea, vomiting, diarrhea, constipation, dizziness, abdominal pain, skin rash, fevers, chills, night sweats, weight loss, swollen lymph nodes, body aches, joint swelling, chest pain, shortness of breath, mood changes. POSITIVE muscle aches  Objective  Blood pressure (!) 150/82, pulse 67,  height 5\' 3"  (1.6 m), weight 122 lb (55.3 kg), SpO2 99 %.   General: No apparent distress alert and oriented x3 mood and affect normal, dressed appropriately.  HEENT: Pupils equal, extraocular movements intact  Respiratory: Patient's speak in full sentences and does not appear short of breath  Cardiovascular: No lower extremity edema, non tender, no erythema  Mild antalgic gait Foot exam shows the patient does have some mild swelling noted over the dorsal aspect of the foot more laterally.  No significant erythema noted.  Tender to palpation with voluntary guarding.  Patient has good range of motion of the ankle.  Neurovascularly intact distally.  Neck exam still has loss of lordosis.  Some mild crepitus.  Still on the right side more than the left today.  Tightness noted in the parascapular region on the right greater than left as well.  Osteopathic findings  C2 flexed rotated and side bent right C6 flexed rotated and side bent left T3 extended rotated and side bent right inhaled rib T9 extended rotated and side bent left L2 flexed rotated and side bent right Sacrum right on right       Assessment and Plan:  Metatarsalgia of both feet Patient has had metatarsalgia previously.  Concerned the patient does have a stress reaction of the midfoot at the moment.  Patient does have some mild swelling and is tender to palpation.  We will get x-rays to further evaluate.  Continue the vitamin D.  Discussed K2.  Discussed a postop boot that I think will be beneficial.  Follow-up with me again in 4 weeks would consider the possibility of ultrasound but secondary to computer upgrade unable to do it today.  Osteopenia History of osteopenia will need to continue to monitor.  Increased risk of stress fracture    Nonallopathic problems  Decision today to treat with OMT was based on Physical Exam  After verbal consent patient was treated with HVLA, ME, FPR techniques in cervical, rib, thoracic,  lumbar, and sacral  areas  Patient tolerated the procedure well with improvement in symptoms  Patient given exercises, stretches and lifestyle modifications  See medications in patient instructions if given  Patient will follow up in 4-8 weeks     The above documentation has been reviewed and is accurate and complete Tina Saa, DO        Note: This dictation was prepared with Dragon dictation along with smaller phrase technology. Any transcriptional errors that result from this process are unintentional.

## 2020-09-29 ENCOUNTER — Encounter: Payer: Self-pay | Admitting: Family Medicine

## 2020-09-29 ENCOUNTER — Ambulatory Visit (INDEPENDENT_AMBULATORY_CARE_PROVIDER_SITE_OTHER): Payer: Medicare PPO

## 2020-09-29 ENCOUNTER — Ambulatory Visit (INDEPENDENT_AMBULATORY_CARE_PROVIDER_SITE_OTHER): Payer: Medicare PPO | Admitting: Family Medicine

## 2020-09-29 ENCOUNTER — Other Ambulatory Visit: Payer: Self-pay

## 2020-09-29 VITALS — BP 150/82 | HR 67 | Ht 63.0 in | Wt 122.0 lb

## 2020-09-29 DIAGNOSIS — M8580 Other specified disorders of bone density and structure, unspecified site: Secondary | ICD-10-CM | POA: Diagnosis not present

## 2020-09-29 DIAGNOSIS — M9903 Segmental and somatic dysfunction of lumbar region: Secondary | ICD-10-CM

## 2020-09-29 DIAGNOSIS — M9904 Segmental and somatic dysfunction of sacral region: Secondary | ICD-10-CM

## 2020-09-29 DIAGNOSIS — M9902 Segmental and somatic dysfunction of thoracic region: Secondary | ICD-10-CM

## 2020-09-29 DIAGNOSIS — M7741 Metatarsalgia, right foot: Secondary | ICD-10-CM | POA: Diagnosis not present

## 2020-09-29 DIAGNOSIS — M9908 Segmental and somatic dysfunction of rib cage: Secondary | ICD-10-CM | POA: Diagnosis not present

## 2020-09-29 DIAGNOSIS — M7742 Metatarsalgia, left foot: Secondary | ICD-10-CM | POA: Diagnosis not present

## 2020-09-29 DIAGNOSIS — M9901 Segmental and somatic dysfunction of cervical region: Secondary | ICD-10-CM

## 2020-09-29 DIAGNOSIS — M79672 Pain in left foot: Secondary | ICD-10-CM

## 2020-09-29 NOTE — Assessment & Plan Note (Signed)
Patient has had metatarsalgia previously.  Concerned the patient does have a stress reaction of the midfoot at the moment.  Patient does have some mild swelling and is tender to palpation.  We will get x-rays to further evaluate.  Continue the vitamin D.  Discussed K2.  Discussed a postop boot that I think will be beneficial.  Follow-up with me again in 4 weeks would consider the possibility of ultrasound but secondary to computer upgrade unable to do it today.

## 2020-09-29 NOTE — Assessment & Plan Note (Signed)
History of osteopenia will need to continue to monitor.  Increased risk of stress fracture

## 2020-09-29 NOTE — Patient Instructions (Addendum)
Good to see you Foot xray today Post op shoe Continue vitamin D and K2 Ice at the end of a long day See me again in 4 weeks

## 2020-10-19 NOTE — Progress Notes (Signed)
[name] [address] [place] [address] Phone: [phone] Subjective:   Tina [name], am serving as a scribe for Dr. Antoine Primas.  I'm seeing this patient by the request  of:  Panosh, [name], MD  CC: Foot pain follow-up, neck pain  [id number]  09/29/2020 Patient has had metatarsalgia previously.  Concerned the patient does have a stress reaction of the midfoot at the moment.  Patient does have some mild swelling and is tender to palpation.  We will get x-rays to further evaluate.  Continue the vitamin D.  Discussed K2.  Discussed a postop boot that I think will be beneficial.  Follow-up with me again in 4 weeks would consider the possibility of ultrasound but secondary to computer upgrade unable to do it today.  Update 10/20/2020 Tina [name] is a 75 y.o. female coming in with complaint of L foot pain. Patient state that her foot is doing well and is wearing the boot. Patient states that  the toe she broke is still not 100% better. Is not sure if that is from the current injury or if it was from when she had broke her toe a year and a half ago, it is like a nerve pain and she notices it a lot when she is in the pool.  Patient states that overall though seems to be getting better.  Patient has only been wearing the postop boot at this time.  Neck pain is still better.  Describes the pain as a dull, throbbing aching pain, nothing that stopping her from activity but still very aware of it.       Past Medical History:  Diagnosis Date   High triglycerides    Hx of varicella    Past Surgical History:  Procedure Laterality Date   CHOLECYSTECTOMY     Social History   Socioeconomic History   Marital status: Divorced    Spouse name: Not on file   Number of children: Not on file   Years of education: Not on file   Highest education level: Not on file  Occupational History   Occupation: Professor    Employer: [employer]  Tobacco  Use   Smoking status: Former    Packs/day: 0.70    Years: 4.00    Pack years: 2.80    Types: Cigarettes   Smokeless tobacco: Never   Tobacco comments:    in her 20's   Vaping Use   Vaping Use: Never used  Substance and Sexual Activity   Alcohol use: Yes    Alcohol/week: 1.0 standard drink    Types: 1 Standard drinks or equivalent per week    Comment: ocassionally   Drug use: No   Sexual activity: Not on file  Other Topics Concern   Not on file  Social History Narrative    Now [employer] [facility] no pets    .    Mother  Moved down to friends home        Divorced one chid and grandchild   Neg ets firearms.    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    Exercises regularly      Neg tad    Social Determinants of Health   Financial Resource Strain: Low Risk    Difficulty of Paying Living Expenses: Not very hard  Food Insecurity: No  Food Insecurity   Worried About Programme researcher, broadcasting/film/video in the Last Year: Never true   Ran Out of Food in the Last Year: Never true  Transportation Needs: No Transportation Needs   Lack of Transportation (Medical): No   Lack of Transportation (Non-Medical): No  Physical Activity: Sufficiently Active   Days of Exercise per Week: 5 days   Minutes of Exercise per Session: 50 min  Stress: No Stress Concern Present   Feeling of Stress : Not at all  Social Connections: Moderately Isolated   Frequency of Communication with Friends and Family: More than three times a week   Frequency of Social Gatherings with Friends and Family: More than three times a week   Attends Religious Services: Never   Database administrator or Organizations: Yes   Attends Engineer, structural: More than 4 times per year   Marital Status: Divorced   No Known Allergies Family History  Problem Relation Age of Onset   Hypertension Mother    Hyperlipidemia Mother    Stroke Mother    Diabetes Father     Miscarriages / [place] Brother       Current Outpatient Medications (Respiratory):    cetirizine (ZYRTEC) 10 MG tablet, Take 10 mg by mouth daily.    Current Outpatient Medications (Other):    cholecalciferol (VITAMIN D) 1000 units tablet, Take 1,000 Units by mouth daily.   COVID-19 mRNA vaccine, Moderna, 100 MCG/0.5ML injection, Inject into the muscle.   famotidine (PEPCID) 20 MG tablet, Take 20 mg by mouth 2 (two) times daily.   MULTIPLE VITAMIN PO, Take by mouth.   Omega-3 Fatty Acids (OMEGA 3 PO), Take by mouth.   tiZANidine (ZANAFLEX) 4 MG tablet, TAKE 1 TABLET (4 MG TOTAL) BY MOUTH AT BEDTIME.   TURMERIC PO, Take by mouth.   Vitamin D, Ergocalciferol, (DRISDOL) 1.25 MG (50000 UNIT) CAPS capsule, Take 1 capsule (50,000 Units total) by mouth every 7 (seven) days. (Patient not taking: Reported on 10/20/2020)   Reviewed prior external information including notes and imaging from  primary care provider As well as notes that were available from care everywhere and other healthcare systems.  Past medical history, social, surgical and family history all reviewed in electronic medical record.  No pertanent information unless stated regarding to the chief complaint.   Review of Systems:  No headache, visual changes, nausea, vomiting, diarrhea, constipation, dizziness, abdominal pain, skin rash, fevers, chills, night sweats, weight loss, swollen lymph nodes, body aches, joint swelling, chest pain, shortness of breath, mood changes. POSITIVE muscle aches  Objective  Blood pressure 118/80, pulse 73, height 5\' 3"  (1.6 m), weight 124 lb (56.2 kg), SpO2 99 %.   General: No apparent distress alert and oriented x3 mood and affect normal, dressed appropriately.  HEENT: Pupils equal, extraocular movements intact  Respiratory: Patient's speak in full sentences and does not appear short of breath  Cardiovascular: No lower extremity edema, non tender, no erythema  Gait mild antalgic walk with  patient being in a postop boot Patient is in tenderness to palpation within the intermetatarsal area L4-5 on the left foot.  No significant pain over the bones himself this time.  No significant swelling noted.  Mild positive squeeze test. Patient does have loss of lordosis of the cervical spine.  No tenderness to the parascapular region left greater than right.  Mild crepitus with sidebending.  Osteopathic findings.   C2 flexed rotated and side bent right C4 flexed rotated and side bent  left C6 flexed rotated and side bent left T3 extended rotated and side bent left inhaled third rib L2 flexed rotated and side bent right Sacrum right on right     Impression and Recommendations:     The above documentation has been reviewed and is accurate and complete [name], DO

## 2020-10-20 ENCOUNTER — Other Ambulatory Visit: Payer: Self-pay

## 2020-10-20 ENCOUNTER — Encounter: Payer: Self-pay | Admitting: Family Medicine

## 2020-10-20 ENCOUNTER — Ambulatory Visit: Payer: Medicare PPO | Admitting: Family Medicine

## 2020-10-20 VITALS — BP 118/80 | HR 73 | Ht 63.0 in | Wt 124.0 lb

## 2020-10-20 DIAGNOSIS — M9901 Segmental and somatic dysfunction of cervical region: Secondary | ICD-10-CM | POA: Diagnosis not present

## 2020-10-20 DIAGNOSIS — M9908 Segmental and somatic dysfunction of rib cage: Secondary | ICD-10-CM

## 2020-10-20 DIAGNOSIS — M7742 Metatarsalgia, left foot: Secondary | ICD-10-CM | POA: Diagnosis not present

## 2020-10-20 DIAGNOSIS — M7741 Metatarsalgia, right foot: Secondary | ICD-10-CM

## 2020-10-20 DIAGNOSIS — M542 Cervicalgia: Secondary | ICD-10-CM | POA: Diagnosis not present

## 2020-10-20 DIAGNOSIS — M9903 Segmental and somatic dysfunction of lumbar region: Secondary | ICD-10-CM

## 2020-10-20 DIAGNOSIS — M9902 Segmental and somatic dysfunction of thoracic region: Secondary | ICD-10-CM | POA: Diagnosis not present

## 2020-10-20 DIAGNOSIS — M999 Biomechanical lesion, unspecified: Secondary | ICD-10-CM | POA: Diagnosis not present

## 2020-10-20 DIAGNOSIS — M9904 Segmental and somatic dysfunction of sacral region: Secondary | ICD-10-CM

## 2020-10-20 NOTE — Patient Instructions (Addendum)
Good to see you  Its looking good Okay to go to a shoe Be careful in yoga but go for it  Elliptical no hands See me again before [place] in three weeks

## 2020-10-21 ENCOUNTER — Encounter: Payer: Self-pay | Admitting: Family Medicine

## 2020-10-21 NOTE — Assessment & Plan Note (Signed)
Questionable concern for stress reaction.  Patient is responding well though.  We discussed the over-the-counter orthotics and the home exercises, which activities to doing which wants to avoid as well as a transitioning into shoes.  Patient will increase activity slowly and follow-up with me again in 6 to 8 weeks.

## 2020-10-21 NOTE — Assessment & Plan Note (Signed)
Chronic left side.  Discussed which activities to do which wants to avoid.  Increase activity slowly.  Discussed posture and ergonomics.  Follow-up with me again in 6 to 8 weeks

## 2020-11-09 NOTE — Progress Notes (Signed)
[name] [address] [place] [address] Phone: [phone] Subjective:    I'm seeing this patient by the request  of:  Panosh, [name], MD  I, Tina [name], LAT, ATC acting as a scribe for Tina Files, DO.  CC: Neck pain follow-up  [id number]  10/20/2020 Questionable concern for stress reaction.  Patient is responding well though.  We discussed the over-the-counter orthotics and the home exercises, which activities to doing which wants to avoid as well as a transitioning into shoes.  Patient will increase activity slowly and follow-up with me again in 6 to 8 weeks.  Update 11/10/2020 Tina [name] is a 75 y.o. female coming in with complaint of back and neck pain. OMT 10/20/2020. Patient states foot is feeling a lot better. Pt reports her neck has been bothering her w/ a shoot pain throughout the superior aspect of head. Pt c/o not sleeping well over the past few nights. Pt notes she has been swimming more recently.          Reviewed prior external information including notes and imaging from previsou exam, outside providers and external EMR if available.   As well as notes that were available from care everywhere and other healthcare systems.  Past medical history, social, surgical and family history all reviewed in electronic medical record.  No pertanent information unless stated regarding to the chief complaint.   Past Medical History:  Diagnosis Date   High triglycerides    Hx of varicella     No Known Allergies   Review of Systems:  No headache, visual changes, nausea, vomiting, diarrhea, constipation, dizziness, abdominal pain, skin rash, fevers, chills, night sweats, weight loss, swollen lymph nodes, body aches, joint swelling, chest pain, shortness of breath, mood changes. POSITIVE muscle aches  Objective  Blood pressure 140/88, pulse 78, height 5\' 3"  (1.6 m), weight 124 lb 12.8 oz (56.6 kg), SpO2 99 %.   General: No  apparent distress alert and oriented x3 mood and affect normal, dressed appropriately.  HEENT: Pupils equal, extraocular movements intact  Respiratory: Patient's speak in full sentences and does not appear short of breath  Cardiovascular: No lower extremity edema, non tender, no erythema  Neck exam does have some mild loss of lordosis.  Patient does have tightness noted in the paraspinal musculature of the left side of the cervical spine.  Negative Spurling's.  Tightness in the left parascapular region in the left trapezius.  Osteopathic findings  C4 flexed rotated and side bent left T3 extended rotated and side bent left inhaled rib L2 flexed rotated and side bent right Sacrum right on right       Assessment and Plan:  Neck pain on left side Chronic, with exacerbation.  X-rays are pending.  Given refill of patient's Zanaflex that I think can be helpful when necessary as well as given a short course of low-dose of prednisone.  Patient has had upper level of elevated eye pressure but not sure glaucoma.  Patient will only take it if necessary.  Patient will be following up with me again in 4 to 5 weeks.   Nonallopathic problems  Decision today to treat with OMT was based on Physical Exam  After verbal consent patient was treated with HVLA, ME, FPR techniques in cervical, rib, thoracic, lumbar, and sacral  areas  Patient tolerated the procedure well with improvement in symptoms  Patient given exercises, stretches and lifestyle modifications  See medications in patient instructions if given  Patient will follow up in 4-8 weeks      The above documentation has been reviewed and is accurate and complete Tina Saa, DO        Note: This dictation was prepared with Dragon dictation along with smaller phrase technology. Any transcriptional errors that result from this process are unintentional.

## 2020-11-10 ENCOUNTER — Encounter: Payer: Self-pay | Admitting: Family Medicine

## 2020-11-10 ENCOUNTER — Ambulatory Visit (INDEPENDENT_AMBULATORY_CARE_PROVIDER_SITE_OTHER): Payer: Medicare PPO

## 2020-11-10 ENCOUNTER — Ambulatory Visit: Payer: Medicare PPO | Admitting: Family Medicine

## 2020-11-10 ENCOUNTER — Other Ambulatory Visit: Payer: Self-pay

## 2020-11-10 VITALS — BP 140/88 | HR 78 | Ht 63.0 in | Wt 124.8 lb

## 2020-11-10 DIAGNOSIS — M9901 Segmental and somatic dysfunction of cervical region: Secondary | ICD-10-CM | POA: Diagnosis not present

## 2020-11-10 DIAGNOSIS — M542 Cervicalgia: Secondary | ICD-10-CM

## 2020-11-10 DIAGNOSIS — M9908 Segmental and somatic dysfunction of rib cage: Secondary | ICD-10-CM | POA: Diagnosis not present

## 2020-11-10 DIAGNOSIS — M9904 Segmental and somatic dysfunction of sacral region: Secondary | ICD-10-CM | POA: Diagnosis not present

## 2020-11-10 DIAGNOSIS — M9902 Segmental and somatic dysfunction of thoracic region: Secondary | ICD-10-CM | POA: Diagnosis not present

## 2020-11-10 DIAGNOSIS — M9903 Segmental and somatic dysfunction of lumbar region: Secondary | ICD-10-CM

## 2020-11-10 MED ORDER — PREDNISONE 20 MG PO TABS: 20.0000 mg | ORAL_TABLET | Freq: Two times a day (BID) | ORAL | 0 refills | Status: DC

## 2020-11-10 MED ORDER — PREDNISONE 20 MG PO TABS: 20.0000 mg | ORAL_TABLET | Freq: Every day | ORAL | 0 refills | Status: DC

## 2020-11-10 MED ORDER — TIZANIDINE HCL 4 MG PO TABS: 4.0000 mg | ORAL_TABLET | Freq: Every day | ORAL | 1 refills | Status: DC

## 2020-11-10 NOTE — Assessment & Plan Note (Signed)
Chronic, with exacerbation.  X-rays are pending.  Given refill of patient's Zanaflex that I think can be helpful when necessary as well as given a short course of low-dose of prednisone.  Patient has had upper level of elevated eye pressure but not sure glaucoma.  Patient will only take it if necessary.  Patient will be following up with me again in 4 to 5 weeks.

## 2020-11-10 NOTE — Patient Instructions (Addendum)
Good to see you  A refill of the muscles relaxer and prednisone has been sent into your pharmacy.  Please get an Xray today before you leave   Have a blast on your trip! Au revoir!  See me again in  4-5 weeks

## 2020-12-06 DIAGNOSIS — Z1231 Encounter for screening mammogram for malignant neoplasm of breast: Secondary | ICD-10-CM | POA: Diagnosis not present

## 2020-12-06 LAB — HM MAMMOGRAPHY

## 2020-12-07 ENCOUNTER — Encounter: Payer: Self-pay | Admitting: Internal Medicine

## 2020-12-07 ENCOUNTER — Ambulatory Visit: Payer: Medicare PPO

## 2020-12-14 ENCOUNTER — Encounter: Payer: Self-pay | Admitting: Family Medicine

## 2020-12-14 ENCOUNTER — Other Ambulatory Visit: Payer: Self-pay

## 2020-12-14 ENCOUNTER — Ambulatory Visit: Payer: Medicare PPO | Admitting: Family Medicine

## 2020-12-14 VITALS — BP 102/80 | HR 63 | Ht 63.0 in | Wt 128.0 lb

## 2020-12-14 DIAGNOSIS — M9902 Segmental and somatic dysfunction of thoracic region: Secondary | ICD-10-CM | POA: Diagnosis not present

## 2020-12-14 DIAGNOSIS — M9908 Segmental and somatic dysfunction of rib cage: Secondary | ICD-10-CM

## 2020-12-14 DIAGNOSIS — M9901 Segmental and somatic dysfunction of cervical region: Secondary | ICD-10-CM

## 2020-12-14 DIAGNOSIS — M542 Cervicalgia: Secondary | ICD-10-CM

## 2020-12-14 NOTE — Assessment & Plan Note (Signed)
Chronic issue.  Seems that patient continues to have some difficulty.  We have discussed the potential for the advanced imaging and potential epidurals.  Patient wants to continue to avoid this now if possible.  Discussed icing regimen and home exercises.  Increase activity slowly.  Follow-up with me again 6 to 8 weeks.

## 2020-12-14 NOTE — Progress Notes (Signed)
[name] [address] [place] [address] Phone: [phone] Subjective:   [name], am serving as a scribe for Dr. Antoine Primas. This visit occurred during the SARS-CoV-2 public health emergency.  Safety protocols were in place, including screening questions prior to the visit, additional usage of staff PPE, and extensive cleaning of exam room while observing appropriate contact time as indicated for disinfecting solutions.   I'm seeing this patient by the request  of:  Panosh, [name], MD  CC: Neck pain follow-up  [id number]  Tina [name] is a 75 y.o. female coming in with complaint of back and neck pain. OMT 11/10/2020. Patient states that her neck is a bit sore as she was at the beach last week. Patient did try prednisone but it didn't help during the trip. Did you mm relaxer while in [place].  Patient states that she has been back and doing more the routine may be slightly better but still not to her baseline.  Medications patient has been prescribed: Prednisone, Zanaflex  Taking: Yes to the Zanaflex          Past Medical History:  Diagnosis Date   High triglycerides    Hx of varicella     No Known Allergies   Review of Systems:  No headache, visual changes, nausea, vomiting, diarrhea, constipation, dizziness, abdominal pain, skin rash, fevers, chills, night sweats, weight loss, swollen lymph nodes, body aches, joint swelling, chest pain, shortness of breath, mood changes. POSITIVE muscle aches  Objective  Blood pressure 102/80, pulse 63, height 5\' 3"  (1.6 m), weight 128 lb (58.1 kg), SpO2 99 %.   General: No apparent distress alert and oriented x3 mood and affect normal, dressed appropriately.  HEENT: Pupils equal, extraocular movements intact  Respiratory: Patient's speak in full sentences and does not appear short of breath  Cardiovascular: No lower extremity edema, non tender, no erythema  Neck exam does have some  loss of lordosis.  Some tenderness to palpation of the paraspinal musculature.  Seems to be more left greater than right.  Not as much severe pain in the thoracolumbar as usual.  He does still have the limitation more in the cervical region.   Osteopathic findings  C2 flexed rotated and side bent right C6 flexed rotated and side bent left T3 extended rotated and side bent right inhaled rib T9 extended rotated and side bent left       Assessment and Plan:  Neck pain on left side Chronic issue.  Seems that patient continues to have some difficulty.  We have discussed the potential for the advanced imaging and potential epidurals.  Patient wants to continue to avoid this now if possible.  Discussed icing regimen and home exercises.  Increase activity slowly.  Follow-up with me again 6 to 8 weeks.   Nonallopathic problems  Decision today to treat with OMT was based on Physical Exam  After verbal consent patient was treated with HVLA, ME, FPR techniques in cervical, rib, thoracic,   areas  Patient tolerated the procedure well with improvement in symptoms  Patient given exercises, stretches and lifestyle modifications  See medications in patient instructions if given  Patient will follow up in 4-8 weeks     The above documentation has been reviewed and is accurate and complete , DO        Note: This dictation was prepared with Dragon dictation along with smaller phrase technology. Any transcriptional errors that result from this  process are unintentional.

## 2020-12-14 NOTE — Patient Instructions (Signed)
Thanks for all the trip ideas Stay active See me in 6 weeks

## 2020-12-20 ENCOUNTER — Other Ambulatory Visit: Payer: Self-pay

## 2020-12-20 ENCOUNTER — Ambulatory Visit (INDEPENDENT_AMBULATORY_CARE_PROVIDER_SITE_OTHER): Payer: Medicare PPO

## 2020-12-20 DIAGNOSIS — Z0000 Encounter for general adult medical examination without abnormal findings: Secondary | ICD-10-CM

## 2020-12-20 NOTE — Patient Instructions (Signed)
Tina [name] , Thank you for taking time to come for your Medicare Wellness Visit. I appreciate your ongoing commitment to your health goals. Please review the following plan we discussed and let me know if I can assist you in the future.   Screening recommendations/referrals: Colonoscopy: 09/12/2012  due 2024 Mammogram: 12/06/2020 Bone Density: Scheduled 12/22/2020 Recommended yearly ophthalmology/optometry visit for glaucoma screening and checkup Recommended yearly dental visit for hygiene and checkup  Vaccinations: Influenza vaccine: due in fall 2022  Pneumococcal vaccine: completed series  Tdap vaccine: 11/21/2020 Shingles vaccine: completed series     Advanced directives: will provide copies   Conditions/risks identified: none   Next appointment: 12/21/2020  [facility] 100[facility]  Dr.Panosh    Preventive Care 74 Years and Older, Female Preventive care refers to lifestyle choices and visits with your health care provider that can promote health and wellness. What does preventive care include? A yearly physical exam. This is also called an annual well check. Dental exams once or twice a year. Routine eye exams. Ask your health care provider how often you should have your eyes checked. Personal lifestyle choices, including: Daily care of your teeth and gums. Regular physical activity. Eating a healthy diet. Avoiding tobacco and drug use. Limiting alcohol use. Practicing safe sex. Taking low-dose aspirin every day. Taking vitamin and mineral supplements as recommended by your health care provider. What happens during an annual well check? The services and screenings done by your health care provider during your annual well check will depend on your age, overall health, lifestyle risk factors, and family history of disease. Counseling  Your health care provider may ask you questions about your: Alcohol use. Tobacco use. Drug use. Emotional well-being. Home and relationship  well-being. Sexual activity. Eating habits. History of falls. Memory and ability to understand (cognition). Work and work Astronomer. Reproductive health. Screening  You may have the following tests or measurements: Height, weight, and BMI. Blood pressure. Lipid and cholesterol levels. These may be checked every 5 years, or more frequently if you are over 48 years old. Skin check. Lung cancer screening. You may have this screening every year starting at age 22 if you have a 30-pack-year history of smoking and currently smoke or have quit within the past 15 years. Fecal occult blood test (FOBT) of the stool. You may have this test every year starting at age 44. Flexible sigmoidoscopy or colonoscopy. You may have a sigmoidoscopy every 5 years or a colonoscopy every 10 years starting at age 22. Hepatitis C blood test. Hepatitis B blood test. Sexually transmitted disease (STD) testing. Diabetes screening. This is done by checking your blood sugar (glucose) after you have not eaten for a while (fasting). You may have this done every 1-3 years. Bone density scan. This is done to screen for osteoporosis. You may have this done starting at age 48. Mammogram. This may be done every 1-2 years. Talk to your health care provider about how often you should have regular mammograms. Talk with your health care provider about your test results, treatment options, and if necessary, the need for more tests. Vaccines  Your health care provider may recommend certain vaccines, such as: Influenza vaccine. This is recommended every year. Tetanus, diphtheria, and acellular pertussis (Tdap, Td) vaccine. You may need a Td booster every 10 years. Zoster vaccine. You may need this after age 47. Pneumococcal 13-valent conjugate (PCV13) vaccine. One dose is recommended after age 33. Pneumococcal polysaccharide (PPSV23) vaccine. One dose is recommended after age 36. Talk  to your health care provider about which  screenings and vaccines you need and how often you need them. This information is not intended to replace advice given to you by your health care provider. Make sure you discuss any questions you have with your health care provider. Document Released: 05/06/2015 Document Revised: 12/28/2015 Document Reviewed: 02/08/2015 Elsevier Interactive Patient Education  2017 [place] Prevention in the Home Falls can cause injuries. They can happen to people of all ages. There are many things you can do to make your home safe and to help prevent falls. What can I do on the outside of my home? Regularly fix the edges of walkways and driveways and fix any cracks. Remove anything that might make you trip as you walk through a door, such as a raised step or threshold. Trim any bushes or trees on the path to your home. Use bright outdoor lighting. Clear any walking paths of anything that might make someone trip, such as rocks or tools. Regularly check to see if handrails are loose or broken. Make sure that both sides of any steps have handrails. Any raised decks and porches should have guardrails on the edges. Have any leaves, snow, or ice cleared regularly. Use sand or salt on walking paths during winter. Clean up any spills in your garage right away. This includes oil or grease spills. What can I do in the bathroom? Use night lights. Install grab bars by the toilet and in the tub and shower. Do not use towel bars as grab bars. Use non-skid mats or decals in the tub or shower. If you need to sit down in the shower, use a plastic, non-slip stool. Keep the floor dry. Clean up any water that spills on the floor as soon as it happens. Remove soap buildup in the tub or shower regularly. Attach bath mats securely with double-sided non-slip rug tape. Do not have throw rugs and other things on the floor that can make you trip. What can I do in the bedroom? Use night lights. Make sure that you have a  light by your bed that is easy to reach. Do not use any sheets or blankets that are too big for your bed. They should not hang down onto the floor. Have a firm chair that has side arms. You can use this for support while you get dressed. Do not have throw rugs and other things on the floor that can make you trip. What can I do in the kitchen? Clean up any spills right away. Avoid walking on wet floors. Keep items that you use a lot in easy-to-reach places. If you need to reach something above you, use a strong step stool that has a grab bar. Keep electrical cords out of the way. Do not use floor polish or wax that makes floors slippery. If you must use wax, use non-skid floor wax. Do not have throw rugs and other things on the floor that can make you trip. What can I do with my stairs? Do not leave any items on the stairs. Make sure that there are handrails on both sides of the stairs and use them. Fix handrails that are broken or loose. Make sure that handrails are as long as the stairways. Check any carpeting to make sure that it is firmly attached to the stairs. Fix any carpet that is loose or worn. Avoid having throw rugs at the top or bottom of the stairs. If you do have throw rugs,  attach them to the floor with carpet tape. Make sure that you have a light switch at the top of the stairs and the bottom of the stairs. If you do not have them, ask someone to add them for you. What else can I do to help prevent falls? Wear shoes that: Do not have high heels. Have rubber bottoms. Are comfortable and fit you well. Are closed at the toe. Do not wear sandals. If you use a stepladder: Make sure that it is fully opened. Do not climb a closed stepladder. Make sure that both sides of the stepladder are locked into place. Ask someone to hold it for you, if possible. Clearly mark and make sure that you can see: Any grab bars or handrails. First and last steps. Where the edge of each step  is. Use tools that help you move around (mobility aids) if they are needed. These include: Canes. Walkers. Scooters. Crutches. Turn on the lights when you go into a dark area. Replace any light bulbs as soon as they burn out. Set up your furniture so you have a clear path. Avoid moving your furniture around. If any of your floors are uneven, fix them. If there are any pets around you, be aware of where they are. Review your medicines with your doctor. Some medicines can make you feel dizzy. This can increase your chance of falling. Ask your doctor what other things that you can do to help prevent falls. This information is not intended to replace advice given to you by your health care provider. Make sure you discuss any questions you have with your health care provider. Document Released: 02/03/2009 Document Revised: 09/15/2015 Document Reviewed: 05/14/2014 Elsevier Interactive Patient Education  2017 [employer].

## 2020-12-20 NOTE — Progress Notes (Signed)
Subjective:   Tina [name] is a 75 y.o. female who presents for Medicare Annual (Subsequent) preventive examination. I connected with Tina [name] today by telephone and verified that I am speaking with the correct person using two identifiers. Location patient: home Location provider: work Persons participating in the virtual visit: patient, provider.   I discussed the limitations, risks, security and privacy concerns of performing an evaluation and management service by telephone and the availability of in person appointments. I also discussed with the patient that there may be a patient responsible charge related to this service. The patient expressed understanding and verbally consented to this telephonic visit.    Interactive audio and video telecommunications were attempted between this provider and patient, however failed, due to patient having technical difficulties OR patient did not have access to video capability.  We continued and completed visit with audio only.    Review of Systems    N/A        Objective:    There were no vitals filed for this visit. There is no height or weight on file to calculate BMI.  Advanced Directives 11/16/2019 07/26/2016  Does Patient Have a Medical Advance Directive? Yes Yes  Type of Estate agent of [place];Living will -  Does patient want to make changes to medical advance directive? No - Patient declined -  Copy of Healthcare Power of Attorney in Chart? Yes - validated most recent copy scanned in chart (See row information) -    Current Medications (verified) Outpatient Encounter Medications as of 12/20/2020  Medication Sig   cetirizine (ZYRTEC) 10 MG tablet Take 10 mg by mouth daily.   cholecalciferol (VITAMIN D) 1000 units tablet Take 1,000 Units by mouth daily.   COVID-19 mRNA vaccine, Moderna, 100 MCG/0.5ML injection Inject into the muscle.   famotidine (PEPCID) 20 MG tablet Take 20 mg by mouth 2 (two) times  daily.   MULTIPLE VITAMIN PO Take by mouth.   Omega-3 Fatty Acids (OMEGA 3 PO) Take by mouth.   tiZANidine (ZANAFLEX) 4 MG tablet Take 1 tablet (4 mg total) by mouth at bedtime.   TURMERIC PO Take by mouth.   Vitamin D, Ergocalciferol, (DRISDOL) 1.25 MG (50000 UNIT) CAPS capsule Take 1 capsule (50,000 Units total) by mouth every 7 (seven) days.   No facility-administered encounter medications on file as of 12/20/2020.    Allergies (verified) Patient has no known allergies.   History: Past Medical History:  Diagnosis Date   High triglycerides    Hx of varicella    Past Surgical History:  Procedure Laterality Date   CHOLECYSTECTOMY     Family History  Problem Relation Age of Onset   Hypertension Mother    Hyperlipidemia Mother    Stroke Mother    Diabetes Father    Miscarriages / [place] Brother    Social History   Socioeconomic History   Marital status: Divorced    Spouse name: Not on file   Number of children: Not on file   Years of education: Not on file   Highest education level: Not on file  Occupational History   Occupation: Professor    Employer: [employer]  Tobacco Use   Smoking status: Former    Packs/day: 0.70    Years: 4.00    Pack years: 2.80    Types: Cigarettes   Smokeless tobacco: Never   Tobacco comments:    in her 20's   Vaping Use   Vaping Use: Never used  Substance  and Sexual Activity   Alcohol use: Yes    Alcohol/week: 1.0 standard drink    Types: 1 Standard drinks or equivalent per week    Comment: ocassionally   Drug use: No   Sexual activity: Not on file  Other Topics Concern   Not on file  Social History Narrative    Now [facility] no pets    .    Mother  Moved down to friends home        Divorced one chid and grandchild   Neg ets firearms.    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    Exercises regularly      Neg tad    Social Determinants of  Health   Financial Resource Strain: Not on file  Food Insecurity: Not on file  Transportation Needs: Not on file  Physical Activity: Not on file  Stress: Not on file  Social Connections: Not on file    Tobacco Counseling Counseling given: Not Answered Tobacco comments: in her 20's    Clinical Intake:                 Diabetic?no         Activities of Daily Living No flowsheet data found.  Patient Care Team: Panosh, Tina Mends, MD as PCP - General (Internal Medicine) Campbell Stall, MD as Attending Physician (Dermatology) Elliot Cousin, OD (Optometry)  Indicate any recent Medical Services you may have received from other than [facility] providers in the past year (date may be approximate).     Assessment:   This is a routine wellness examination for Tina [name].  Hearing/Vision screen No results found.  Dietary issues and exercise activities discussed:     Goals Addressed   None    Depression Screen PHQ 2/9 Scores 11/16/2019 11/16/2019 11/14/2018 07/29/2017 07/26/2016 07/23/2016 07/19/2015  PHQ - 2 Score 0 0 0 0 0 0 0  PHQ- 9 Score 0 0 - - - - -    Fall Risk Fall Risk  11/16/2019 11/16/2019 11/14/2018 07/29/2017 07/26/2016  Falls in the past year? 0 0 0 No No  Number falls in past yr: 0 - 0 - -  Injury with Fall? 0 - 0 - -  Risk for fall due to : No Fall Risks - - - -  Follow up Falls evaluation completed;Falls prevention discussed - - - -    FALL RISK PREVENTION PERTAINING TO THE HOME:  Any stairs in or around the home? Yes  If so, are there any without handrails? No  Home free of loose throw rugs in walkways, pet beds, electrical cords, etc? Yes  Adequate lighting in your home to reduce risk of falls? Yes   ASSISTIVE DEVICES UTILIZED TO PREVENT FALLS:  Life alert? No  Use of a cane, walker or w/c? No  Grab bars in the bathroom? Yes  Shower chair or bench in shower? Yes  Elevated toilet seat or a handicapped toilet? No    Cognitive Function:  Normal cognitive  status assessed by direct observation by this Nurse Health Advisor. No abnormalities found.   MMSE - Mini Mental State Exam 07/26/2016  Not completed: (No Data)     6CIT Screen 11/16/2019  What Year? 0 points  What month? 0 points  What time? 0 points  Count back from 20 0 points  Months in reverse 0  points  Repeat phrase 0 points  Total Score 0    Immunizations Immunization History  Administered Date(s) Administered   Fluad Quad(high Dose 65+) 01/07/2019   Hepatitis B 09/09/1995, 10/11/1995, 07/03/1996   Influenza Split 01/22/2012   Influenza, High Dose Seasonal PF 03/02/2015, 02/03/2016, 01/14/2017, 02/03/2018   Influenza-Unspecified 02/03/2014, 01/14/2017   Moderna SARS-COV2 Booster Vaccination 08/31/2020   PFIZER(Purple Top)SARS-COV-2 Vaccination 05/12/2019, 05/31/2019   Pneumococcal Conjugate-13 07/07/2012   Pneumococcal Polysaccharide-23 03/27/1999, 07/21/2013   Td 11/21/2016   Tdap 03/18/2006   Zoster Recombinat (Shingrix) 01/14/2017   Zoster, Live 05/12/2008    TDAP status: Up to date  Flu Vaccine status: Up to date  Pneumococcal vaccine status: Up to date  Covid-19 vaccine status: Completed vaccines  Qualifies for Shingles Vaccine? Yes   Zostavax completed Yes   Shingrix Completed?: Yes  Screening Tests Health Maintenance  Topic Date Due   OPHTHALMOLOGY EXAM  11/21/2016   Zoster Vaccines- Shingrix (2 of 2) 03/11/2017   HEMOGLOBIN A1C  05/18/2020   COVID-19 Vaccine (4 - Booster) 12/01/2020   INFLUENZA VACCINE  11/21/2020   COLONOSCOPY (Pts [id number] Insurance coverage will need to be confirmed)  09/13/2022   MAMMOGRAM  12/07/2022   TETANUS/TDAP  11/22/2026   DEXA SCAN  Completed   Hepatitis C Screening  Completed   PNA vac Low Risk Adult  Completed   HPV VACCINES  Aged Out    Health Maintenance  Health Maintenance Due  Topic Date Due   OPHTHALMOLOGY EXAM  11/21/2016   Zoster Vaccines- Shingrix (2 of 2) 03/11/2017   HEMOGLOBIN A1C  05/18/2020    COVID-19 Vaccine (4 - Booster) 12/01/2020   INFLUENZA VACCINE  11/21/2020    Colorectal cancer screening: Type of screening: Colonoscopy. Completed 09/12/2012. Repeat every 10 years  Mammogram status: Completed 12/06/2020. Repeat every year  Bone Density status: Completed appointment 12/22/2020. Results reflect: Bone density results: OSTEOPENIA. Repeat every 5 years.  Lung Cancer Screening: (Low Dose CT Chest recommended if Age 27-80 years, 30 pack-year currently smoking OR have quit w/in 15years.) does not qualify.   Lung Cancer Screening Referral: n/a  Additional Screening:  Hepatitis C Screening: does not qualify; Completed 11/16/2019  Vision Screening: Recommended annual ophthalmology exams for early detection of glaucoma and other disorders of the eye. Is the patient up to date with their annual eye exam?  Yes  Who is the provider or what is the name of the office in which the patient attends annual eye exams? Dr.Skelton  If pt is not established with a provider, would they like to be referred to a provider to establish care? No .   Dental Screening: Recommended annual dental exams for proper oral hygiene  Community Resource Referral / Chronic Care Management: CRR required this visit?  No   CCM required this visit?  No      Plan:     I have personally reviewed and noted the following in the patient's chart:   Medical and social history Use of alcohol, tobacco or illicit drugs  Current medications and supplements including opioid prescriptions.  Functional ability and status Nutritional status Physical activity Advanced directives List of other physicians Hospitalizations, surgeries, and ER visits in previous 12 months Vitals Screenings to include cognitive, depression, and falls Referrals and appointments  In addition, I have reviewed and discussed with patient certain preventive protocols, quality metrics, and best practice recommendations. A written  personalized care plan for preventive services as well as general preventive health recommendations were provided to  patient.     March Rummage, LPN   10/22/6376   Nurse Notes: none

## 2020-12-20 NOTE — Progress Notes (Signed)
Chief Complaint  Patient presents with   Annual Exam     HPI: Patient  Tina [name]  75 y.o. comes in today for Preventive Health Care visit  Generally well Had Covid  early august : after [place].  Trip with family member back home  and then positive.  Mild allergy sx.  Like  congested tends to get congested on the right side sinus Flonase helps but has borderline IOP  She states she called the office and was transferred to a nurse assessment who gave general advice. Never got an appointment but did okay Father [id number] had gotten COVID was treated with pack Slo-Bid plus oxygen steroids and has done well.  Avoided hospitalization.  Iopd suspect     sinus issues  responds to  flonase but  concern if effect iop. No loss of taste or smell.  No fever Health Maintenance  Topic Date Due   OPHTHALMOLOGY EXAM  11/21/2016   Zoster Vaccines- Shingrix (2 of 2) 03/11/2017   HEMOGLOBIN A1C  05/18/2020   COVID-19 Vaccine (4 - Booster) 12/01/2020   INFLUENZA VACCINE  11/21/2020   MAMMOGRAM  12/06/2021   COLONOSCOPY (Pts 45-7545yrs Insurance coverage will need to be confirmed)  09/13/2022   TETANUS/TDAP  11/22/2026   DEXA SCAN  Completed   Hepatitis C Screening  Completed   PNA vac Low Risk Adult  Completed   HPV VACCINES  Aged Out   Health Maintenance Review LIFESTYLE:  Exercise:   high  had small stress fx lef foot and back  silver sneakers. Yoga.  Tobacco/ETS: no Alcohol:  rare  Sugar beverages: no Sleep: 7 ish Drug use: no HH of  1 and dog      ROS:  REST of 12 system review negative except as per HPI covering from stress fracture better   Past Medical History:  Diagnosis Date   High triglycerides    Hx of varicella     Past Surgical History:  Procedure Laterality Date   CHOLECYSTECTOMY      Family History  Problem Relation Age of Onset   Hypertension Mother    Hyperlipidemia Mother    Stroke Mother    Diabetes Father    Miscarriages / [place] Brother     Social  History   Socioeconomic History   Marital status: Divorced    Spouse name: Not on file   Number of children: Not on file   Years of education: Not on file   Highest education level: Not on file  Occupational History   Occupation: Professor    Employer: [employer]  Tobacco Use   Smoking status: Former    Packs/day: 0.70    Years: 4.00    Pack years: 2.80    Types: Cigarettes   Smokeless tobacco: Never   Tobacco comments:    in her 20's   Vaping Use   Vaping Use: Never used  Substance and Sexual Activity   Alcohol use: Yes    Alcohol/week: 1.0 standard drink    Types: 1 Standard drinks or equivalent per week    Comment: ocassionally   Drug use: No   Sexual activity: Not on file  Other Topics Concern   Not on file  Social History Narrative    Now h[facility] no pets    .    Mother  Moved down to friends home        Divorced one chid and grandchild   Neg ets firearms.    Dept head  [employer]   Super full time  .    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    Exercises regularly      Neg tad    Social Determinants of Health   Financial Resource Strain: Low Risk    Difficulty of Paying Living Expenses: Not hard at all  Food Insecurity: No Food Insecurity   Worried About Programme researcher, broadcasting/film/video in the Last Year: Never true   Ran Out of Food in the Last Year: Never true  Transportation Needs: No Transportation Needs   Lack of Transportation (Medical): No   Lack of Transportation (Non-Medical): No  Physical Activity: Sufficiently Active   Days of Exercise per Week: 5 days   Minutes of Exercise per Session: 60 min  Stress: No Stress Concern Present   Feeling of Stress : Not at all  Social Connections: Unknown   Frequency of Communication with Friends and Family: Three times a week   Frequency of Social Gatherings with Friends and Family: Three times a week   Attends Religious Services: Not on file   Active Member of Clubs or Organizations:  Yes   Attends Banker Meetings: More than 4 times per year   Marital Status: Divorced    Outpatient Medications Prior to Visit  Medication Sig Dispense Refill   cetirizine (ZYRTEC) 10 MG tablet Take 10 mg by mouth daily.     cholecalciferol (VITAMIN D) 1000 units tablet Take 1,000 Units by mouth daily.     COVID-19 mRNA vaccine, Moderna, 100 MCG/0.5ML injection Inject into the muscle. 0.25 mL 0   famotidine (PEPCID) 20 MG tablet Take 20 mg by mouth 2 (two) times daily.     Latanoprost 0.005 % EMUL Place into both eyes at bedtime.     MULTIPLE VITAMIN PO Take by mouth.     Omega-3 Fatty Acids (OMEGA 3 PO) Take by mouth.     tiZANidine (ZANAFLEX) 4 MG tablet Take 1 tablet (4 mg total) by mouth at bedtime. 90 tablet 1   TURMERIC PO Take by mouth.     Vitamin D, Ergocalciferol, (DRISDOL) 1.25 MG (50000 UNIT) CAPS capsule Take 1 capsule (50,000 Units total) by mouth every 7 (seven) days. 4 capsule 0   No facility-administered medications prior to visit.     EXAM:  BP 140/80 (BP Location: Left Arm, Patient Position: Sitting, Cuff Size: Normal)   Pulse 67   Temp 98.6 F (37 C) (Oral)   Ht 5' 3.5" (1.613 m)   Wt 124 lb 9.6 oz (56.5 kg)   SpO2 98%   BMI 21.73 kg/m   Body mass index is 21.73 kg/m. Wt Readings from Last 3 Encounters:  12/21/20 124 lb 9.6 oz (56.5 kg)  12/14/20 128 lb (58.1 kg)  11/10/20 124 lb 12.8 oz (56.6 kg)    Physical Exam: Vital signs reviewed IOX:BDZH is a well-developed well-nourished alert cooperative    who appearsr stated age in no acute distress.  HEENT: normocephalic atraumatic , Eyes: PERRL EOM's full, conjunctiva clear,  Ears: no deformity EAC's clear TMs with normal landmarks. Mouth: masked  NECK: supple without masses, thyromegaly or bruits. CHEST/PULM:  Clear to auscultation and percussion breath sounds equal no wheeze , rales or rhonchi. No chest wall deformities or tenderness. Breast: normal by inspection . No dimpling, discharge,  masses, tenderness or discharge . CV: PMI is nondisplaced, S1 S2 no gallops, murmurs, rubs. Peripheral pulses are full  without delay.No JVD .  ABDOMEN: Bowel sounds normal nontender  No guard or rebound, no hepato splenomegal no CVA tenderness.   Extremtities:  No clubbing cyanosis or edema, no acute joint swelling or redness no focal atrophy NEURO:  Oriented x3, cranial nerves 3-12 appear to be intact, no obvious focal weakness,gait within normal limits no abnormal reflexes or asymmetrical SKIN: No acute rashes normal turgor, color, no bruising or petechiae. PSYCH: Oriented, good eye contact, no obvious depression anxiety, cognition and judgment appear normal. LN: no cervical axillary inguinal adenopathy  Lab Results  Component Value Date   WBC 11.2 (H) 11/16/2019   HGB 13.5 11/16/2019   HCT 40.4 11/16/2019   PLT 300 11/16/2019   GLUCOSE 98 11/16/2019   CHOL 208 (H) 11/16/2019   TRIG 58 11/16/2019   HDL 68 11/16/2019   LDLDIRECT 137.3 07/02/2012   LDLCALC 126 (H) 11/16/2019   ALT 23 11/16/2019   AST 21 11/16/2019   NA 137 11/16/2019   K 4.6 11/16/2019   CL 103 11/16/2019   CREATININE 0.82 11/16/2019   BUN 19 11/16/2019   CO2 27 11/16/2019   TSH 1.53 11/16/2019   HGBA1C 5.8 (H) 11/16/2019   MICROALBUR 0.7 07/12/2015    BP Readings from Last 3 Encounters:  12/21/20 140/80  12/14/20 102/80  11/10/20 140/88    Previous lab reviewed with patient's slightly elevated lipids but no compelling reason to repeat today. States her blood pressure is normal and in range at home  ASSESSMENT AND PLAN:  Discussed the following assessment and plan:    ICD-10-CM   1. Visit for preventive health examination  Z00.00     2. Personal history of COVID-19  Z86.16     Discussed congestion what is known risk-benefit of Flonase short-term may need to investigate further. If continued feeling of obstruction on the right nose contact us will refer to ENT. Viewed record and I do not see  documentation of her phone call about having COVID and asking for assessment and nurse assessment. Return in about 1 year (around 12/21/2021).  Patient Care Team: Gevork Ayyad, [name], MD as PCP - General (Internal Medicine) Campbell Stall, MD as Attending Physician (Dermatology) Elliot Cousin, OD [facility]) Patient Instructions  Good to see  you today .  Try flonase for 10 days then as needed to see if helps sinuses.  If ongoing nasal obstructive sx after covid resolution  consider seeing ENT . Let us know if need referral.   Check into risk of minimal flonase use for iop.   Continue lifestyle intervention healthy eating and exercise .   Yearly check  Health Maintenance, Female Adopting a healthy lifestyle and getting preventive care are important in promoting health and wellness. Ask your health care provider about: The right schedule for you to have regular tests and exams. Things you can do on your own to prevent diseases and keep yourself healthy. What should I know about diet, weight, and exercise? Eat a healthy diet  Eat a diet that includes plenty of vegetables, fruits, low-fat dairy products, and lean protein. Do not eat a lot of foods that are high in solid fats, added sugars, or sodium. Maintain a healthy weight Body mass index (BMI) is used to identify weight problems. It estimates body fat based on height and weight. Your health care provider can help determine your BMI and help you achieve or maintain a healthy weight. Get regular exercise Get regular exercise. This is one of the most important things  you can do for your health. Most adults should: Exercise for at least 150 minutes each week. The exercise should increase your heart rate and make you sweat (moderate-intensity exercise). Do strengthening exercises at least twice a week. This is in addition to the moderate-intensity exercise. Spend less time sitting. Even light physical activity can be beneficial. Watch  cholesterol and blood lipids Have your blood tested for lipids and cholesterol at 74 years of age, then have this test every 5 years. Have your cholesterol levels checked more often if: Your lipid or cholesterol levels are high. You are older than 74 years of age. You are at high risk for heart disease. What should I know about cancer screening? Depending on your health history and family history, you may need to have cancer screening at various ages. This may include screening for: Breast cancer. Cervical cancer. Colorectal cancer. Skin cancer. Lung cancer. What should I know about heart disease, diabetes, and high blood pressure? Blood pressure and heart disease High blood pressure causes heart disease and increases the risk of stroke. This is more likely to develop in people who have high blood pressure readings, are of African descent, or are overweight. Have your blood pressure checked: Every 3-5 years if you are 25-1 years of age. Every year if you are 27 years old or older. Diabetes Have regular diabetes screenings. This checks your fasting blood sugar level. Have the screening done: Once every three years after age 36 if you are at a normal weight and have a low risk for diabetes. More often and at a younger age if you are overweight or have a high risk for diabetes. What should I know about preventing infection? Hepatitis B If you have a higher risk for hepatitis B, you should be screened for this virus. Talk with your health care provider to find out if you are at risk for hepatitis B infection. Hepatitis C Testing is recommended for: Everyone born from [birth year] through 1964. Anyone with known risk factors for hepatitis C. Sexually transmitted infections (STIs) Get screened for STIs, including gonorrhea and chlamydia, if: You are sexually active and are younger than 74 years of age. You are older than 74 years of age and your health care provider tells you that you are at risk  for this type of infection. Your sexual activity has changed since you were last screened, and you are at increased risk for chlamydia or gonorrhea. Ask your health care provider if you are at risk. Ask your health care provider about whether you are at high risk for HIV. Your health care provider may recommend a prescription medicine to help prevent HIV infection. If you choose to take medicine to prevent HIV, you should first get tested for HIV. You should then be tested every 3 months for as long as you are taking the medicine. Pregnancy If you are about to stop having your period (premenopausal) and you may become pregnant, seek counseling before you get pregnant. Take 400 to 800 micrograms (mcg) of folic acid every day if you become pregnant. Ask for birth control (contraception) if you want to prevent pregnancy. Osteoporosis and menopause Osteoporosis is a disease in which the bones lose minerals and strength with aging. This can result in bone fractures. If you are 15 years old or older, or if you are at risk for osteoporosis and fractures, ask your health care provider if you should: Be screened for bone loss. Take a calcium or vitamin D supplement to lower  your risk of fractures. Be given hormone replacement therapy (HRT) to treat symptoms of menopause. Follow these instructions at home: Lifestyle Do not use any products that contain nicotine or tobacco, such as cigarettes, e-cigarettes, and chewing tobacco. If you need help quitting, ask your health care provider. Do not use street drugs. Do not share needles. Ask your health care provider for help if you need support or information about quitting drugs. Alcohol use Do not drink alcohol if: Your health care provider tells you not to drink. You are pregnant, may be pregnant, or are planning to become pregnant. If you drink alcohol: Limit how much you use to 0-1 drink a day. Limit intake if you are breastfeeding. Be aware of how much  alcohol is in your drink. In the [place] one drink equals one 12 oz bottle of beer (355 mL), one 5 oz glass of wine (148 mL), or one 1 oz glass of hard liquor (44 mL). General instructions Schedule regular health, dental, and eye exams. Stay current with your vaccines. Tell your health care provider if: You often feel depressed. You have ever been abused or do not feel safe at home. Summary Adopting a healthy lifestyle and getting preventive care are important in promoting health and wellness. Follow your health care provider's instructions about healthy diet, exercising, and getting tested or screened for diseases. Follow your health care provider's instructions on monitoring your cholesterol and blood pressure. This information is not intended to replace advice given to you by your health care provider. Make sure you discuss any questions you have with your health care provider. Document Revised: 06/17/2020 Document Reviewed: 04/02/2018 Elsevier Patient Education  2022 [employer].  [place]. Taelynn Mcelhannon M.D.

## 2020-12-21 ENCOUNTER — Ambulatory Visit (INDEPENDENT_AMBULATORY_CARE_PROVIDER_SITE_OTHER): Payer: Medicare PPO | Admitting: Internal Medicine

## 2020-12-21 ENCOUNTER — Encounter: Payer: Self-pay | Admitting: Internal Medicine

## 2020-12-21 VITALS — BP 140/80 | HR 67 | Temp 98.6°F | Ht 63.5 in | Wt 124.6 lb

## 2020-12-21 DIAGNOSIS — Z8616 Personal history of COVID-19: Secondary | ICD-10-CM | POA: Diagnosis not present

## 2020-12-21 DIAGNOSIS — Z0000 Encounter for general adult medical examination without abnormal findings: Secondary | ICD-10-CM | POA: Diagnosis not present

## 2020-12-21 NOTE — Patient Instructions (Signed)
Good to see  you today .  Try flonase for 10 days then as needed to see if helps sinuses.  If ongoing nasal obstructive sx after covid resolution  consider seeing ENT . Let us know if need referral.   Check into risk of minimal flonase use for iop.   Continue lifestyle intervention healthy eating and exercise .   Yearly check  Health Maintenance, Female Adopting a healthy lifestyle and getting preventive care are important in promoting health and wellness. Ask your health care provider about: The right schedule for you to have regular tests and exams. Things you can do on your own to prevent diseases and keep yourself healthy. What should I know about diet, weight, and exercise? Eat a healthy diet  Eat a diet that includes plenty of vegetables, fruits, low-fat dairy products, and lean protein. Do not eat a lot of foods that are high in solid fats, added sugars, or sodium. Maintain a healthy weight Body mass index (BMI) is used to identify weight problems. It estimates body fat based on height and weight. Your health care provider can help determine your BMI and help you achieve or maintain a healthy weight. Get regular exercise Get regular exercise. This is one of the most important things you can do for your health. Most adults should: Exercise for at least 150 minutes each week. The exercise should increase your heart rate and make you sweat (moderate-intensity exercise). Do strengthening exercises at least twice a week. This is in addition to the moderate-intensity exercise. Spend less time sitting. Even light physical activity can be beneficial. Watch cholesterol and blood lipids Have your blood tested for lipids and cholesterol at 74 years of age, then have this test every 5 years. Have your cholesterol levels checked more often if: Your lipid or cholesterol levels are high. You are older than 74 years of age. You are at high risk for heart disease. What should I know about  cancer screening? Depending on your health history and family history, you may need to have cancer screening at various ages. This may include screening for: Breast cancer. Cervical cancer. Colorectal cancer. Skin cancer. Lung cancer. What should I know about heart disease, diabetes, and high blood pressure? Blood pressure and heart disease High blood pressure causes heart disease and increases the risk of stroke. This is more likely to develop in people who have high blood pressure readings, are of African descent, or are overweight. Have your blood pressure checked: Every 3-5 years if you are 45-34 years of age. Every year if you are 35 years old or older. Diabetes Have regular diabetes screenings. This checks your fasting blood sugar level. Have the screening done: Once every three years after age 57 if you are at a normal weight and have a low risk for diabetes. More often and at a younger age if you are overweight or have a high risk for diabetes. What should I know about preventing infection? Hepatitis B If you have a higher risk for hepatitis B, you should be screened for this virus. Talk with your health care provider to find out if you are at risk for hepatitis B infection. Hepatitis C Testing is recommended for: Everyone born from [birth year] through 1964. Anyone with known risk factors for hepatitis C. Sexually transmitted infections (STIs) Get screened for STIs, including gonorrhea and chlamydia, if: You are sexually active and are younger than 74 years of age. You are older than 74 years of age and your health  care provider tells you that you are at risk for this type of infection. Your sexual activity has changed since you were last screened, and you are at increased risk for chlamydia or gonorrhea. Ask your health care provider if you are at risk. Ask your health care provider about whether you are at high risk for HIV. Your health care provider may recommend a prescription  medicine to help prevent HIV infection. If you choose to take medicine to prevent HIV, you should first get tested for HIV. You should then be tested every 3 months for as long as you are taking the medicine. Pregnancy If you are about to stop having your period (premenopausal) and you may become pregnant, seek counseling before you get pregnant. Take 400 to 800 micrograms (mcg) of folic acid every day if you become pregnant. Ask for birth control (contraception) if you want to prevent pregnancy. Osteoporosis and menopause Osteoporosis is a disease in which the bones lose minerals and strength with aging. This can result in bone fractures. If you are 97 years old or older, or if you are at risk for osteoporosis and fractures, ask your health care provider if you should: Be screened for bone loss. Take a calcium or vitamin D supplement to lower your risk of fractures. Be given hormone replacement therapy (HRT) to treat symptoms of menopause. Follow these instructions at home: Lifestyle Do not use any products that contain nicotine or tobacco, such as cigarettes, e-cigarettes, and chewing tobacco. If you need help quitting, ask your health care provider. Do not use street drugs. Do not share needles. Ask your health care provider for help if you need support or information about quitting drugs. Alcohol use Do not drink alcohol if: Your health care provider tells you not to drink. You are pregnant, may be pregnant, or are planning to become pregnant. If you drink alcohol: Limit how much you use to 0-1 drink a day. Limit intake if you are breastfeeding. Be aware of how much alcohol is in your drink. In the [place] one drink equals one 12 oz bottle of beer (355 mL), one 5 oz glass of wine (148 mL), or one 1 oz glass of hard liquor (44 mL). General instructions Schedule regular health, dental, and eye exams. Stay current with your vaccines. Tell your health care provider if: You often feel  depressed. You have ever been abused or do not feel safe at home. Summary Adopting a healthy lifestyle and getting preventive care are important in promoting health and wellness. Follow your health care provider's instructions about healthy diet, exercising, and getting tested or screened for diseases. Follow your health care provider's instructions on monitoring your cholesterol and blood pressure. This information is not intended to replace advice given to you by your health care provider. Make sure you discuss any questions you have with your health care provider. Document Revised: 06/17/2020 Document Reviewed: 04/02/2018 Elsevier Patient Education  2022 [employer].

## 2020-12-22 ENCOUNTER — Ambulatory Visit: Payer: Medicare PPO | Admitting: Family Medicine

## 2020-12-22 DIAGNOSIS — Z780 Asymptomatic menopausal state: Secondary | ICD-10-CM | POA: Diagnosis not present

## 2020-12-22 DIAGNOSIS — M85852 Other specified disorders of bone density and structure, left thigh: Secondary | ICD-10-CM | POA: Diagnosis not present

## 2020-12-22 DIAGNOSIS — M85851 Other specified disorders of bone density and structure, right thigh: Secondary | ICD-10-CM | POA: Diagnosis not present

## 2020-12-22 DIAGNOSIS — M810 Age-related osteoporosis without current pathological fracture: Secondary | ICD-10-CM | POA: Diagnosis not present

## 2020-12-22 LAB — HM DEXA SCAN

## 2020-12-28 ENCOUNTER — Encounter: Payer: Self-pay | Admitting: Internal Medicine

## 2021-01-02 NOTE — Telephone Encounter (Signed)
Apologies for the delay in response. So T-scores are a standard deviation from the average young adult female.  That is what is used as a vetted risk number.  the lowest score site is taken to make a diagnosis by scan.  -2.5 is considered osteoporosis -1 to -2.5 is considered low bone density So you can see there is a matter of degree. Of severity   So the sites of the hip areas are in the osteopenic range but the total spine is in the -2.5 T score osteoporosis range. So the diagnosis is osteoporosis .   I do not have data on 2 points on the curve to be able to tell a rate of decline .  But every one bone density declines with age at some rate.  The last bone density in the system was 2015 with a different machine which is not comparable and the lowest T score was -1.7.  At this level you can consider medication with bone building therapy in addition to weightbearing resistance exercise and optimizing calcium vitamin D in your diet or supplementation. Also family history of hip fracture is a risk .  Bone building therapies are not a guarantee of prevention future fracture but it does decrease risk of spontaneous fracture in the future. These medicines are taken long-term at this time ;some we give for 5 years such as the Fosamax type medicine;Other like Prolia ,which is an injection every 6 months ,is given continuously.  Would repeat the bone density in 2 years.  Can make a virutal visit    to discuss  and if you want to add medication .

## 2021-01-03 NOTE — Telephone Encounter (Signed)
Noted  choice to observe  and follow with lsi

## 2021-01-17 DIAGNOSIS — H401131 Primary open-angle glaucoma, bilateral, mild stage: Secondary | ICD-10-CM | POA: Diagnosis not present

## 2021-01-18 DIAGNOSIS — C44519 Basal cell carcinoma of skin of other part of trunk: Secondary | ICD-10-CM | POA: Diagnosis not present

## 2021-01-18 DIAGNOSIS — L821 Other seborrheic keratosis: Secondary | ICD-10-CM | POA: Diagnosis not present

## 2021-01-18 DIAGNOSIS — L814 Other melanin hyperpigmentation: Secondary | ICD-10-CM | POA: Diagnosis not present

## 2021-01-18 DIAGNOSIS — D485 Neoplasm of uncertain behavior of skin: Secondary | ICD-10-CM | POA: Diagnosis not present

## 2021-01-18 DIAGNOSIS — D2272 Melanocytic nevi of left lower limb, including hip: Secondary | ICD-10-CM | POA: Diagnosis not present

## 2021-01-18 DIAGNOSIS — L578 Other skin changes due to chronic exposure to nonionizing radiation: Secondary | ICD-10-CM | POA: Diagnosis not present

## 2021-01-18 DIAGNOSIS — Z86018 Personal history of other benign neoplasm: Secondary | ICD-10-CM | POA: Diagnosis not present

## 2021-01-18 DIAGNOSIS — D225 Melanocytic nevi of trunk: Secondary | ICD-10-CM | POA: Diagnosis not present

## 2021-01-24 NOTE — Progress Notes (Signed)
Tina [name] [address] [place] [address] Phone: [phone] Subjective:   Tina [name], am serving as a scribe for Tina [name]. This visit occurred during the SARS-CoV-2 public health emergency.  Safety protocols were in place, including screening questions prior to the visit, additional usage of staff PPE, and extensive cleaning of exam room while observing appropriate contact time as indicated for disinfecting solutions.   I'm seeing this patient by the request  of:  Panosh, Tina Mends, Tina [name]  CC: back and neck pain follow up   [id number]  Tina [name] is a 75 y.o. female coming in with complaint of back and neck pain. OMT 12/14/2020. Patient states back and neck pain remain the same. No new complaints. Discuss bone density scan reviewed patient's bone density today.  Patient does have a T score of -2.5.  This is some progression since the 2015.  Medications patient has been prescribed: Zanaflex  Taking:           Past Medical History:  Diagnosis Date   High triglycerides    Hx of varicella     No Known Allergies   Review of Systems:  No  visual changes, nausea, vomiting, diarrhea, constipation, dizziness, abdominal pain, skin rash, fevers, chills, night sweats, weight loss, swollen lymph nodes, body aches, joint swelling, chest pain, shortness of breath, mood changes. POSITIVE muscle aches, mild headache   Objective  Blood pressure 130/82, pulse 71, height 5\' 3"  (1.6 m), weight 124 lb (56.2 kg), SpO2 98 %.   General: No apparent distress alert and oriented x3 mood and affect normal, dressed appropriately.  HEENT: Pupils equal, extraocular movements intact  Respiratory: Patient's speak in full sentences and does not appear short of breath  Cardiovascular: No lower extremity edema, non tender, no erythema  Neck exam does have some loss of lordosis.  Tightness noted on the left side of the neck with sidebending and rotation.   Mild crepitus noted.  Negative Spurling's.  Tightness in the left parascapular region  Osteopathic findings  C7 flexed rotated and side bent left T9 extended rotated and side bent left inhaled rib L1 flexed rotated and side bent right Sacrum right on right       Assessment and Plan:  Osteopenia Patient is at the aspect of more osteoporosis at this time.  We discussed the possibility of true treatment.  Discussed discussing with primary care provider.  Patient would like to avoid this and would like to try the calcium supplementation.  Patient over the course of the last 7 years has had some progression glowingly discussed potentially monitoring.  Patient will continue to do some weightbearing exercises and we encouraged her to consider if no true bisphosphonate for a week last may be consider another bone density in 1 year.  Neck pain on left side Chronic problem with mild exacerbation.  Patient has had some tightness.  Does have the Zanaflex for breakthrough.  No radicular symptoms.  Responded extremely well though to osteopathic manipulation today.  Discussed home exercises and icing regimen.  Follow-up again in 6 weeks   Nonallopathic problems  Decision today to treat with OMT was based on Physical Exam  After verbal consent patient was treated with HVLA, ME, FPR techniques in cervical, rib, thoracic, lumbar, and sacral  areas  Patient tolerated the procedure well with improvement in symptoms  Patient given exercises, stretches and lifestyle modifications  See medications in patient instructions if given  Patient will follow  up in 4-8 weeks      The above documentation has been reviewed and is accurate and complete Tina Saa, DO       Note: This dictation was prepared with Dragon dictation along with smaller phrase technology. Any transcriptional errors that result from this process are unintentional.

## 2021-01-25 ENCOUNTER — Ambulatory Visit: Payer: Medicare PPO | Admitting: Family Medicine

## 2021-01-25 ENCOUNTER — Other Ambulatory Visit: Payer: Self-pay

## 2021-01-25 ENCOUNTER — Encounter: Payer: Self-pay | Admitting: Family Medicine

## 2021-01-25 VITALS — BP 130/82 | HR 71 | Ht 63.0 in | Wt 124.0 lb

## 2021-01-25 DIAGNOSIS — M542 Cervicalgia: Secondary | ICD-10-CM | POA: Diagnosis not present

## 2021-01-25 DIAGNOSIS — M9901 Segmental and somatic dysfunction of cervical region: Secondary | ICD-10-CM | POA: Diagnosis not present

## 2021-01-25 DIAGNOSIS — M9904 Segmental and somatic dysfunction of sacral region: Secondary | ICD-10-CM

## 2021-01-25 DIAGNOSIS — M9902 Segmental and somatic dysfunction of thoracic region: Secondary | ICD-10-CM | POA: Diagnosis not present

## 2021-01-25 DIAGNOSIS — M8580 Other specified disorders of bone density and structure, unspecified site: Secondary | ICD-10-CM | POA: Diagnosis not present

## 2021-01-25 DIAGNOSIS — M9903 Segmental and somatic dysfunction of lumbar region: Secondary | ICD-10-CM | POA: Diagnosis not present

## 2021-01-25 DIAGNOSIS — M9908 Segmental and somatic dysfunction of rib cage: Secondary | ICD-10-CM

## 2021-01-25 NOTE — Assessment & Plan Note (Signed)
Patient is at the aspect of more osteoporosis at this time.  We discussed the possibility of true treatment.  Discussed discussing with primary care provider.  Patient would like to avoid this and would like to try the calcium supplementation.  Patient over the course of the last 7 years has had some progression glowingly discussed potentially monitoring.  Patient will continue to do some weightbearing exercises and we encouraged her to consider if no true bisphosphonate for a week last may be consider another bone density in 1 year.

## 2021-01-25 NOTE — Patient Instructions (Signed)
Great to see you Keep up with the Calcium supplements Another bone density in 1-2 years See you again in 5-6 weeks

## 2021-01-25 NOTE — Assessment & Plan Note (Signed)
Chronic problem with mild exacerbation.  Patient has had some tightness.  Does have the Zanaflex for breakthrough.  No radicular symptoms.  Responded extremely well though to osteopathic manipulation today.  Discussed home exercises and icing regimen.  Follow-up again in 6 weeks

## 2021-02-02 DIAGNOSIS — C4491 Basal cell carcinoma of skin, unspecified: Secondary | ICD-10-CM | POA: Diagnosis not present

## 2021-02-02 DIAGNOSIS — L905 Scar conditions and fibrosis of skin: Secondary | ICD-10-CM | POA: Diagnosis not present

## 2021-02-14 ENCOUNTER — Encounter: Payer: Self-pay | Admitting: Family Medicine

## 2021-03-09 ENCOUNTER — Ambulatory Visit: Payer: Medicare PPO | Admitting: Family Medicine

## 2021-03-20 IMAGING — DX DG KNEE STANDING AP BILAT
1 series · 1 of 1 positions shown · non-contrast
Comparison: None.

CLINICAL DATA: Chronic bilateral knee pain without known injury.

EXAM:
BILATERAL KNEES STANDING - 1 VIEW

[knee ap]
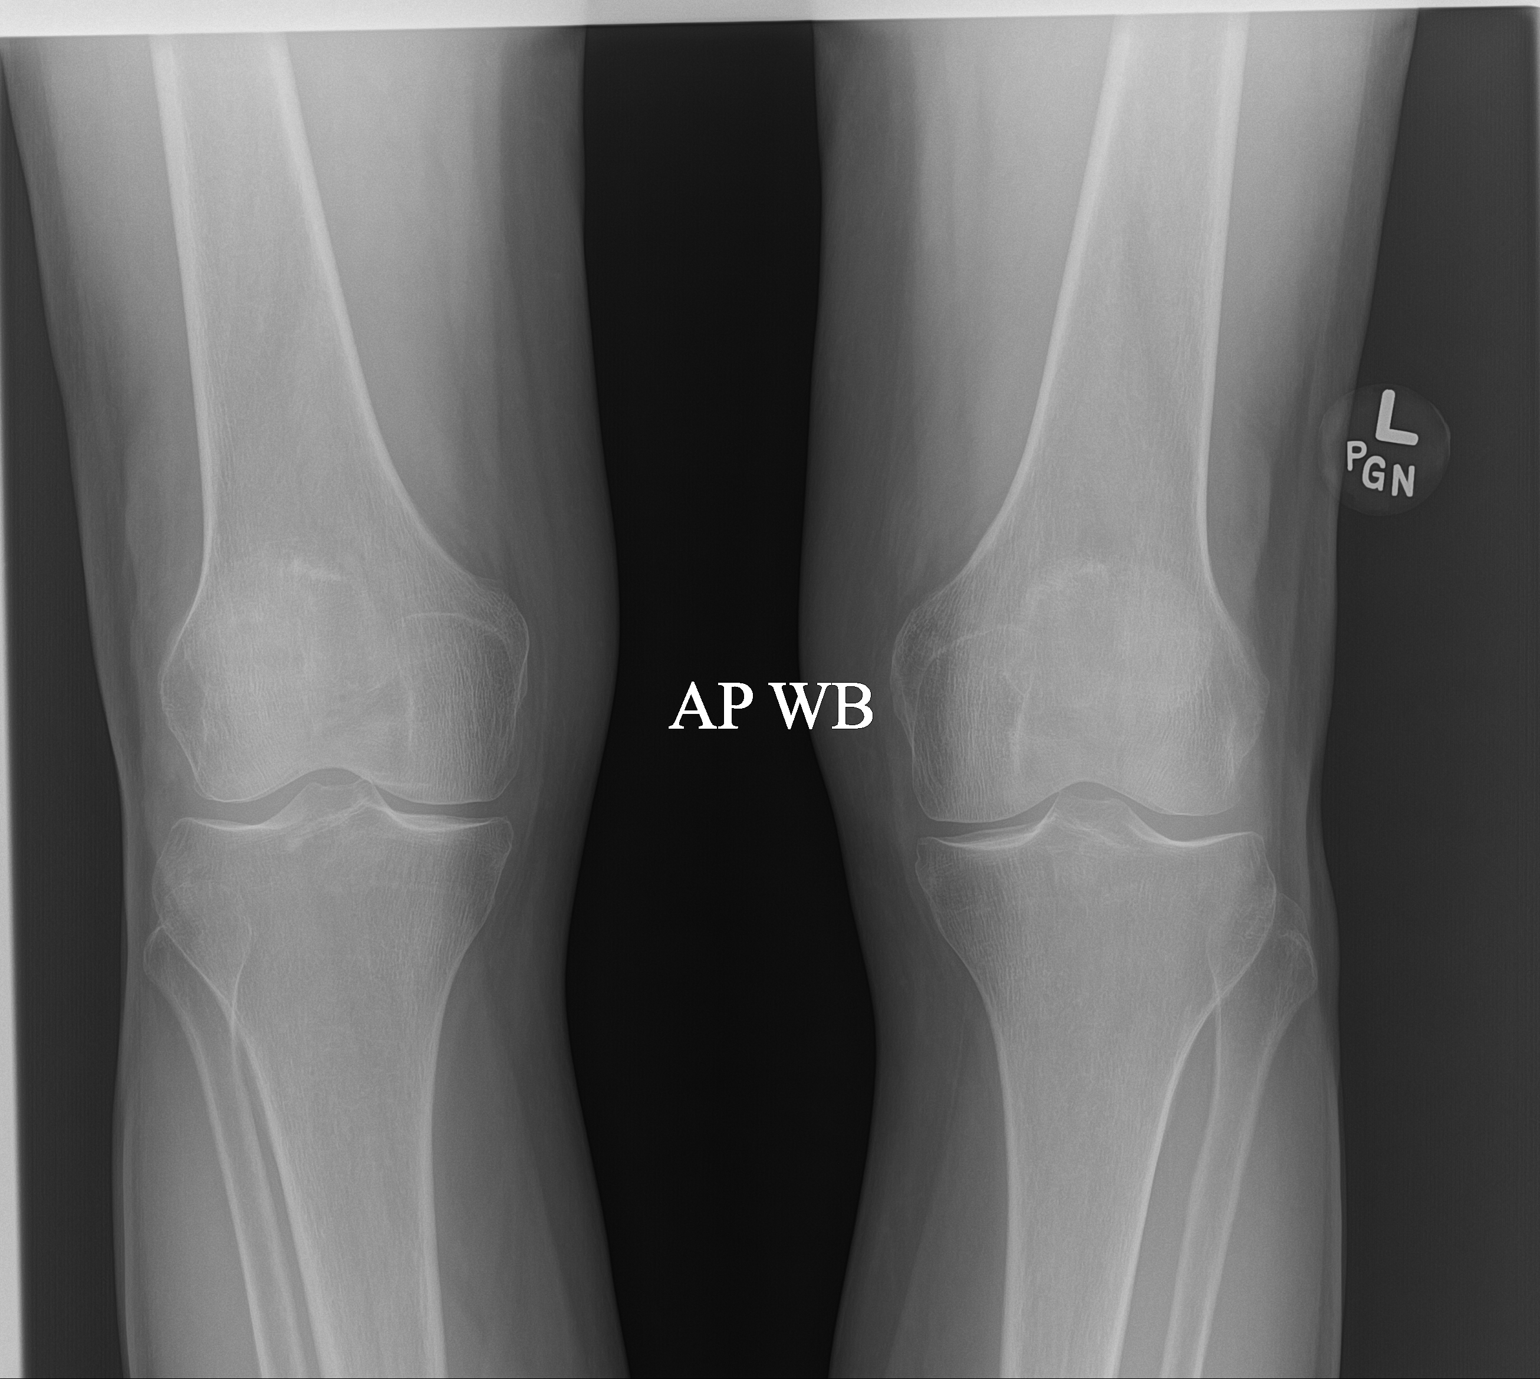

[1 of 1 positions shown; findings below may reference images not displayed]

FINDINGS: No evidence of fracture, dislocation, or joint effusion. No evidence
of arthropathy or other focal bone abnormality. Soft tissues are
unremarkable.
IMPRESSION: Negative.

## 2021-04-06 NOTE — Progress Notes (Signed)
[name] [address] [place] [address] Phone: [phone] Subjective:   Tina [name], am serving as a scribe for Dr. Antoine Primas. This visit occurred during the SARS-CoV-2 public health emergency.  Safety protocols were in place, including screening questions prior to the visit, additional usage of staff PPE, and extensive cleaning of exam room while observing appropriate contact time as indicated for disinfecting solutions.   I'm seeing this patient by the request  of:  Panosh, [name], MD  CC: back and neck pain   [id number]  Tina [name] is a 75 y.o. female coming in with complaint of back and neck pain. OMT 01/25/2021. Patient states neck pain. No new complaints.  Medications patient has been prescribed: Zanaflex  Taking:yes          Reviewed prior external information including notes and imaging from previsou exam, outside providers and external EMR if available.   As well as notes that were available from care everywhere and other healthcare systems.  Past medical history, social, surgical and family history all reviewed in electronic medical record.  No pertanent information unless stated regarding to the chief complaint.   Past Medical History:  Diagnosis Date   High triglycerides    Hx of varicella     No Known Allergies   Review of Systems:  No headache, visual changes, nausea, vomiting, diarrhea, constipation, dizziness, abdominal pain, skin rash, fevers, chills, night sweats, weight loss, swollen lymph nodes, body aches, joint swelling, chest pain, shortness of breath, mood changes. POSITIVE muscle aches  Objective  Blood pressure 112/68, pulse 78, height 5\' 3"  (1.6 m), weight 123 lb (55.8 kg), SpO2 98 %.   General: No apparent distress alert and oriented x3 mood and affect normal, dressed appropriately.  HEENT: Pupils equal, extraocular movements intact  Respiratory: Patient's speak in full sentences and does  not appear short of breath  Cardiovascular: No lower extremity edema, non tender, no erythema  Neck exam does have some mild loss lordosis.  Still tightness noted on the left side of the neck.  Does have some very mild crepitus noted.  Tender to palpation The parascapular region as well left greater than right.  Osteopathic findings  C2 flexed rotated and side bent right C6 flexed rotated and side bent left T3 extended rotated and side bent left inhaled rib L2 flexed rotated and side bent right Sacrum right on right       Assessment and Plan:  Neck pain on left side Chronic, stable.  This does have some mild tightness still noted on the left side of the neck.  Discussed which activities to do which wants to avoid.  Increase activity slowly.  Follow-up again in 6 to 8 weeks    Nonallopathic problems  Decision today to treat with OMT was based on Physical Exam  After verbal consent patient was treated with HVLA, ME, FPR techniques in cervical, rib, thoracic, lumbar, and sacral  areas  Patient tolerated the procedure well with improvement in symptoms  Patient given exercises, stretches and lifestyle modifications  See medications in patient instructions if given  Patient will follow up in 4-8 weeks     The above documentation has been reviewed and is accurate and complete , DO        Note: This dictation was prepared with Dragon dictation along with smaller phrase technology. Any transcriptional errors that result from this process are unintentional.

## 2021-04-07 ENCOUNTER — Ambulatory Visit: Payer: Medicare PPO | Admitting: Family Medicine

## 2021-04-07 ENCOUNTER — Other Ambulatory Visit: Payer: Self-pay

## 2021-04-07 DIAGNOSIS — M542 Cervicalgia: Secondary | ICD-10-CM | POA: Diagnosis not present

## 2021-04-07 NOTE — Patient Instructions (Signed)
Good to see you! Happy Holidays See you again in 6-8 weeks

## 2021-04-07 NOTE — Assessment & Plan Note (Signed)
Chronic, stable.  This does have some mild tightness still noted on the left side of the neck.  Discussed which activities to do which wants to avoid.  Increase activity slowly.  Follow-up again in 6 to 8 weeks

## 2021-04-21 DIAGNOSIS — H401131 Primary open-angle glaucoma, bilateral, mild stage: Secondary | ICD-10-CM | POA: Diagnosis not present

## 2021-05-24 NOTE — Progress Notes (Signed)
°  Tina [name] [place] Phone: [phone] Subjective:    I'm seeing this patient by the request  of:  Panosh, Standley Brooking, MD  CC: Neck pain  [id number]  Tina [name] is a 76 y.o. female coming in with complaint of back and neck pain. OMT 01/25/2021. Patient states that she is ready for OMT neck is tight today patient did bring in her new exercises she got from her yoga class.  Total time reviewing notes, discussing with patient and reviewing patient previous imaging greater than 30 minutes.  Medications patient has been prescribed: None  Taking:   Reviewed prior external information including notes and imaging from previsou exam, outside providers and external EMR if available.   As well as notes that were available from care everywhere and other healthcare systems.  Past medical history, social, surgical and family history all reviewed in electronic medical record.  No pertanent information unless stated regarding to the chief complaint.   Past Medical History:  Diagnosis Date   High triglycerides    Hx of varicella     No Known Allergies   Review of Systems:  No headache, visual changes, nausea, vomiting, diarrhea, constipation, dizziness, abdominal pain, skin rash, fevers, chills, night sweats, weight loss, swollen lymph nodes, body aches, joint swelling, chest pain, shortness of breath, mood changes. POSITIVE muscle aches  Objective  Blood pressure 120/70, pulse 78, height 5\' 3"  (1.6 m), weight 122 lb (55.3 kg), SpO2 98 %.   General: No apparent distress alert and oriented x3 mood and affect normal, dressed appropriately.  HEENT: Pupils equal, extraocular movements intact  Respiratory: Patient's speak in full sentences and does not appear short of breath  Cardiovascular: No lower extremity edema, non tender, no erythema  Neuro: Cranial nerves II through XII are intact, neurovascularly intact in all extremities  with 2+ DTRs and 2+ pulses.  Gait normal with good balance and coordination.  Neck exam does have some loss of lordosis.  Some tenderness to palpation in the paraspinal musculature.  Patient does have with FABER test bilaterally on the left side.  Very mild kyphosis noted.  Osteopathic findings  C6 flexed rotated and side bent left T3 extended rotated and side bent left inhaled rib L4 flexed rotated and side bent right Sacrum right on right       Assessment and Plan:  Neck pain on left side Continue tightness noted.  Patient is going to do some more postural exercises.  Patient does have the muscle relaxer for breakthrough.  Patient is to increase activity slowly.  Discussed icing regimen and home exercises.  Follow-up again in 6 to 8 weeks.   Nonallopathic problems  Decision today to treat with OMT was based on Physical Exam  After verbal consent patient was treated with HVLA, ME, FPR techniques in cervical, rib, thoracic, lumbar, and sacral  areas  Patient tolerated the procedure well with improvement in symptoms  Patient given exercises, stretches and lifestyle modifications  See medications in patient instructions if given  Patient will follow up in 4-8 weeks      The above documentation has been reviewed and is accurate and complete Lyndal Pulley, DO        Note: This dictation was prepared with Dragon dictation along with smaller phrase technology. Any transcriptional errors that result from this process are unintentional.

## 2021-05-25 ENCOUNTER — Ambulatory Visit: Payer: Medicare PPO | Admitting: Family Medicine

## 2021-05-25 ENCOUNTER — Other Ambulatory Visit: Payer: Self-pay

## 2021-05-25 VITALS — BP 120/70 | HR 78 | Ht 63.0 in | Wt 122.0 lb

## 2021-05-25 DIAGNOSIS — M9908 Segmental and somatic dysfunction of rib cage: Secondary | ICD-10-CM

## 2021-05-25 DIAGNOSIS — M9904 Segmental and somatic dysfunction of sacral region: Secondary | ICD-10-CM

## 2021-05-25 DIAGNOSIS — M9903 Segmental and somatic dysfunction of lumbar region: Secondary | ICD-10-CM | POA: Diagnosis not present

## 2021-05-25 DIAGNOSIS — M9902 Segmental and somatic dysfunction of thoracic region: Secondary | ICD-10-CM

## 2021-05-25 DIAGNOSIS — M542 Cervicalgia: Secondary | ICD-10-CM | POA: Diagnosis not present

## 2021-05-25 DIAGNOSIS — M9901 Segmental and somatic dysfunction of cervical region: Secondary | ICD-10-CM | POA: Diagnosis not present

## 2021-05-25 NOTE — Patient Instructions (Addendum)
Good to see you  Thank you for giving me reading Collagen looks good however to high of a dose Follow up in 5-6 weeks

## 2021-05-25 NOTE — Assessment & Plan Note (Signed)
Continue tightness noted.  Patient is going to do some more postural exercises.  Patient does have the muscle relaxer for breakthrough.  Patient is to increase activity slowly.  Discussed icing regimen and home exercises.  Follow-up again in 6 to 8 weeks.

## 2021-06-27 NOTE — Progress Notes (Signed)
?Terrilee Files D.O. ?[facility] ?[address] [place] [address] ?Phone: [phone] ?Subjective:   ?I, Wilford Grist, am serving as a scribe for Dr. Antoine Primas. ? ?This visit occurred during the SARS-CoV-2 public health emergency.  Safety protocols were in place, including screening questions prior to the visit, additional usage of staff PPE, and extensive cleaning of exam room while observing appropriate contact time as indicated for disinfecting solutions.  ? ? ?I'm seeing this patient by the request  of:  Panosh, [name], MD ? ?CC: Back and neck pain follow-up ? ?[id number]  ?Tina [name] is a 76 y.o. female coming in with complaint of back and neck pain. OMT 05/25/2021. Patient states that she has been doing well. When she has pain it is in the L shoulder. Notes tightness in L shoulder with trigger point in L trap.  ? ?Notes pain in L foot since last visit that went away after she wore a boot. [facility]. Pain over metatarsals and base of 5th. Had a tingling feeling between metatarsals during one classes.  ? ?Medications patient has been prescribed: None ? ?Taking: ? ? ?  ? ? ? ? ?Reviewed prior external information including notes and imaging from previsou exam, outside providers and external EMR if available.  ? ?As well as notes that were available from care everywhere and other healthcare systems. ? ?Past medical history, social, surgical and family history all reviewed in electronic medical record.  No pertanent information unless stated regarding to the chief complaint.  ? ?Past Medical History:  ?Diagnosis Date  ? High triglycerides   ? Hx of varicella   ?  ?No Known Allergies ? ? ?Review of Systems: ? No headache, visual changes, nausea, vomiting, diarrhea, constipation, dizziness, abdominal pain, skin rash, fevers, chills, night sweats, weight loss, swollen lymph nodes, body aches, joint swelling, chest pain, shortness of  breath, mood changes. POSITIVE muscle aches ? ?Objective  ?Blood pressure 124/86, pulse (!) 59, height 5\' 3"  (1.6 m), weight 124 lb (56.2 kg), SpO2 99 %. ?  ?General: No apparent distress alert and oriented x3 mood and affect normal, dressed appropriately.  ?HEENT: Pupils equal, extraocular movements intact  ?Respiratory: Patient's speak in full sentences and does not appear short of breath  ?Cardiovascular: No lower extremity edema, non tender, no erythema  ?Neck exam does have some mild loss of lordosis.  Tightness noted in the parascapular region on the left side.  Also has some trigger points noted on the left side of the trapezius, rhomboid and levator scapula.  Patient also has tightness of the left side of the neck noted.  Mild discomfort more on the right sacroiliac joint.  Mild tightness compared to the contralateral side ? ?Osteopathic findings ? ?C5 flexed rotated and side bent left ?T3 extended rotated and side bent left inhaled rib ?T7 extended rotated and side bent left ?L2 flexed rotated and side bent right ?Sacrum right on right ? ? ? ? ?  ?Assessment and Plan: ? ?Neck pain on left side ?Continue chronic problem the patient wants to continue with conservative therapy.  Does have the Zanaflex for the exacerbations.  Discussed icing regimen and home exercises, discussed which activities to do which ones to avoid.  We discussed monitoring of lifting mechanics.  Follow-up again in 6 to 8 weeks ?  ? ?Nonallopathic problems ? ?Decision today to treat with OMT was based on  Physical Exam ? ?After verbal consent patient was treated with HVLA, ME, FPR techniques in cervical, rib, thoracic, lumbar, and sacral  areas ? ?Patient tolerated the procedure well with improvement in symptoms ? ?Patient given exercises, stretches and lifestyle modifications ? ?See medications in patient instructions if given ? ?Patient will follow up in 4-8 weeks ? ?  ? ?The above documentation has been reviewed and is accurate and  complete Lyndal Pulley, DO ? ? ? ?  ? ? Note: This dictation was prepared with Dragon dictation along with smaller phrase technology. Any transcriptional errors that result from this process are unintentional.    ?  ?  ? ?

## 2021-06-29 ENCOUNTER — Other Ambulatory Visit: Payer: Self-pay

## 2021-06-29 ENCOUNTER — Ambulatory Visit: Payer: Medicare PPO | Admitting: Family Medicine

## 2021-06-29 VITALS — BP 124/86 | HR 59 | Ht 63.0 in | Wt 124.0 lb

## 2021-06-29 DIAGNOSIS — M9902 Segmental and somatic dysfunction of thoracic region: Secondary | ICD-10-CM | POA: Diagnosis not present

## 2021-06-29 DIAGNOSIS — M9904 Segmental and somatic dysfunction of sacral region: Secondary | ICD-10-CM

## 2021-06-29 DIAGNOSIS — M542 Cervicalgia: Secondary | ICD-10-CM | POA: Diagnosis not present

## 2021-06-29 DIAGNOSIS — M9908 Segmental and somatic dysfunction of rib cage: Secondary | ICD-10-CM | POA: Diagnosis not present

## 2021-06-29 DIAGNOSIS — M9901 Segmental and somatic dysfunction of cervical region: Secondary | ICD-10-CM

## 2021-06-29 DIAGNOSIS — M9903 Segmental and somatic dysfunction of lumbar region: Secondary | ICD-10-CM | POA: Diagnosis not present

## 2021-06-29 NOTE — Assessment & Plan Note (Signed)
Continue chronic problem the patient wants to continue with conservative therapy.  Does have the Zanaflex for the exacerbations.  Discussed icing regimen and home exercises, discussed which activities to do which ones to avoid.  We discussed monitoring of lifting mechanics.  Follow-up again in 6 to 8 weeks ?

## 2021-06-29 NOTE — Patient Instructions (Signed)
Always good to see you ?Can consider trigger point ?Monitor self in mirror when lifting ?See me in 6 weeks ?

## 2021-08-08 NOTE — Progress Notes (Signed)
?Charlann Boxer D.O. ?[facility] ?[place] ?Phone: [phone] ?Subjective:   ?I, Jacqualin Combes, am serving as a scribe for Dr. Hulan Saas. ? ?This visit occurred during the SARS-CoV-2 public health emergency.  Safety protocols were in place, including screening questions prior to the visit, additional usage of staff PPE, and extensive cleaning of exam room while observing appropriate contact time as indicated for disinfecting solutions.  ?I'm seeing this patient by the request  of:  Panosh, Standley Brooking, MD ? ?CC: Neck and back pain follow-up ? ?[id number]  ?Tina [name] is a 76 y.o. female coming in with complaint of back and neck pain. OMT 06/29/2021. Patient states that she has been doing well since last visit.  ? ?Medications patient has been prescribed: None ? ?Taking: ? ? ?  ? ? ? ? ?Reviewed prior external information including notes and imaging from previsou exam, outside providers and external EMR if available.  ? ?As well as notes that were available from care everywhere and other healthcare systems. ? ?Past medical history, social, surgical and family history all reviewed in electronic medical record.  No pertanent information unless stated regarding to the chief complaint.  ? ?Past Medical History:  ?Diagnosis Date  ? High triglycerides   ? Hx of varicella   ?  ?No Known Allergies ? ? ?Review of Systems: ? No headache, visual changes, nausea, vomiting, diarrhea, constipation, dizziness, abdominal pain, skin rash, fevers, chills, night sweats, weight loss, swollen lymph nodes, body aches, joint swelling, chest pain, shortness of breath, mood changes. POSITIVE muscle aches ? ?Objective  ?Blood pressure 112/72, pulse 70, height 5\' 3"  (1.6 m), weight 124 lb (56.2 kg), SpO2 96 %. ?  ?General: No apparent distress alert and oriented x3 mood and affect normal, dressed appropriately.  ?HEENT: Pupils equal, extraocular movements intact  ?Respiratory: Patient's speak in full  sentences and does not appear short of breath  ?Cardiovascular: No lower extremity edema, non tender, no erythema  ?Continued mild tightness noted of the neck.  Seems to still be on the left side.  Limited sidebending on the left greater than right.  Patient does have tenderness to palpation.  Negative Spurling's.  Tightness noted in the left side of the parascapular region. ? ?Osteopathic findings ? ?C2 flexed rotated and side bent right ?C7 flexed rotated and side bent left ?T3 extended rotated and side bent left inhaled rib ?T6 extended rotated and side bent left ?L14flexed rotated and side bent right ?Sacrum right on right ? ? ? ? ?  ?Assessment and Plan: ? ?Neck pain on left side ?Chronic but stable overall.  No significant change in management at this time.  Discussed home exercises and icing regimen.  Discussed which activities to do which ones to avoid.  Increase activity slowly.  Follow-up again in 6 to 8 weeks.  ? ?Nonallopathic problems ? ?Decision today to treat with OMT was based on Physical Exam ? ?After verbal consent patient was treated with HVLA, ME, FPR techniques in cervical, rib, thoracic, lumbar, and sacral  areas ? ?Patient tolerated the procedure well with improvement in symptoms ? ?Patient given exercises, stretches and lifestyle modifications ? ?See medications in patient instructions if given ? ?Patient will follow up in 4-8 weeks ? ?  ? ? ?The above documentation has been reviewed and is accurate and complete Lyndal Pulley, DO ? ? ? ?  ? ? Note: This dictation was prepared with Dragon dictation along with smaller phrase  technology. Any transcriptional errors that result from this process are unintentional.    ?  ?  ? ?

## 2021-08-10 ENCOUNTER — Ambulatory Visit: Payer: Medicare PPO | Admitting: Family Medicine

## 2021-08-10 ENCOUNTER — Encounter: Payer: Self-pay | Admitting: Family Medicine

## 2021-08-10 VITALS — BP 112/72 | HR 70 | Ht 63.0 in | Wt 124.0 lb

## 2021-08-10 DIAGNOSIS — M9902 Segmental and somatic dysfunction of thoracic region: Secondary | ICD-10-CM

## 2021-08-10 DIAGNOSIS — M9903 Segmental and somatic dysfunction of lumbar region: Secondary | ICD-10-CM

## 2021-08-10 DIAGNOSIS — M9904 Segmental and somatic dysfunction of sacral region: Secondary | ICD-10-CM | POA: Diagnosis not present

## 2021-08-10 DIAGNOSIS — M9901 Segmental and somatic dysfunction of cervical region: Secondary | ICD-10-CM | POA: Diagnosis not present

## 2021-08-10 DIAGNOSIS — M9908 Segmental and somatic dysfunction of rib cage: Secondary | ICD-10-CM | POA: Diagnosis not present

## 2021-08-10 DIAGNOSIS — M542 Cervicalgia: Secondary | ICD-10-CM

## 2021-08-10 NOTE — Assessment & Plan Note (Signed)
Chronic but stable overall.  No significant change in management at this time.  Discussed home exercises and icing regimen.  Discussed which activities to do which ones to avoid.  Increase activity slowly.  Follow-up again in 6 to 8 weeks. ?

## 2021-08-10 NOTE — Patient Instructions (Signed)
Good to see you! ?Have a great birthday party ?Stay active ?See you again in 6-8 weeks ?

## 2021-09-11 ENCOUNTER — Encounter: Payer: Self-pay | Admitting: Internal Medicine

## 2021-09-11 ENCOUNTER — Ambulatory Visit: Payer: Medicare PPO | Admitting: Internal Medicine

## 2021-09-11 VITALS — BP 116/80 | HR 70 | Temp 99.0°F | Ht 63.0 in | Wt 121.2 lb

## 2021-09-11 DIAGNOSIS — Z789 Other specified health status: Secondary | ICD-10-CM | POA: Diagnosis not present

## 2021-09-11 DIAGNOSIS — R197 Diarrhea, unspecified: Secondary | ICD-10-CM

## 2021-09-11 MED ORDER — AZITHROMYCIN 500 MG PO TABS: 500.0000 mg | ORAL_TABLET | Freq: Every day | ORAL | 0 refills | Status: AC

## 2021-09-11 NOTE — Progress Notes (Unsigned)
Chief Complaint  Patient presents with   Diarrhea    HPI: Tina [name] 76 y.o. come in for SDA work in returned from a trip to [place] may have met some friends 3 days in [place] then [place] no one else got sick.  In the middle of the trip she had some watery loose stools liquidy fairly frequent.  She returned home May 14 she has been back 8 days and still has some continued difficulty.  No fever vomiting blood in the stool seems to be more frequent after she eats or drinks something.  Has taken some Imodium with temporary relief but then returns.  Thought it was getting better over the last day or so but then returned.  No nocturnal symptoms currently Concern is that she needs to travel in a few weeks to [place] with her grandchild. Remote history of Giardia when she lived in [place].  Nothing recently. Helpful temporarily when takes Imodium but when wears off may have 3-4 stools. Has some cramping right before diarrhea but no ongoing abdominal pain or cyst other systemic symptoms. ROS: See pertinent positives and negatives per HPI.  Past Medical History:  Diagnosis Date   High triglycerides    Hx of varicella     Family History  Problem Relation Age of Onset   Hypertension Mother    Hyperlipidemia Mother    Stroke Mother    Diabetes Father    Miscarriages / [place] Brother     Social History   Socioeconomic History   Marital status: Divorced    Spouse name: Not on file   Number of children: Not on file   Years of education: Not on file   Highest education level: Doctorate  Occupational History   Occupation: Professor    Employer: [employer]  Tobacco Use   Smoking status: Former    Packs/day: 0.70    Years: 4.00    Pack years: 2.80    Types: Cigarettes   Smokeless tobacco: Never   Tobacco comments:    in her 20's   Vaping Use   Vaping Use: Never used  Substance and Sexual Activity   Alcohol use: Yes    Alcohol/week: 1.0 standard drink    Types: 1  Standard drinks or equivalent per week    Comment: ocassionally   Drug use: No   Sexual activity: Not on file  Other Topics Concern   Not on file  Social History Narrative    Now [facility] no pets    .    Mother  Moved down to friends home        Divorced one chid and grandchild   Neg ets firearms.    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    Retire this summer 16   Educ [place] leave.  This semester.    Exercises regularly      Neg tad    Social Determinants of Health   Financial Resource Strain: Low Risk    Difficulty of Paying Living Expenses: Not hard at all  Food Insecurity: No Food Insecurity   Worried About Charity fundraiser in the Last Year: Never true   Ran Out of Food in the Last Year: Never true  Transportation Needs: No Transportation Needs   Lack of Transportation (Medical): No   Lack of Transportation (Non-Medical): No  Physical Activity: Sufficiently Active   Days of Exercise per Week: 7 days   Minutes  of Exercise per Session: 60 min  Stress: No Stress Concern Present   Feeling of Stress : Not at all  Social Connections: Unknown   Frequency of Communication with Friends and Family: More than three times a week   Frequency of Social Gatherings with Friends and Family: More than three times a week   Attends Religious Services: Patient refused   Active Member of Clubs or Organizations: Yes   Attends Club or Organization Meetings: Patient refused   Marital Status: Divorced    Outpatient Medications Prior to Visit  Medication Sig Dispense Refill   cetirizine (ZYRTEC) 10 MG tablet Take 10 mg by mouth daily.     cholecalciferol (VITAMIN D) 1000 units tablet Take 1,000 Units by mouth daily.     COVID-19 mRNA vaccine, Moderna, 100 MCG/0.5ML injection Inject into the muscle. 0.25 mL 0   famotidine (PEPCID) 20 MG tablet Take 20 mg by mouth 2 (two) times daily.     Latanoprost 0.005 % EMUL Place into both eyes at bedtime.     MULTIPLE VITAMIN PO Take  by mouth.     Omega-3 Fatty Acids (OMEGA 3 PO) Take by mouth.     tiZANidine (ZANAFLEX) 4 MG tablet Take 1 tablet (4 mg total) by mouth at bedtime. 90 tablet 1   TURMERIC PO Take by mouth.     Vitamin D, Ergocalciferol, (DRISDOL) 1.25 MG (50000 UNIT) CAPS capsule Take 1 capsule (50,000 Units total) by mouth every 7 (seven) days. 4 capsule 0   No facility-administered medications prior to visit.     EXAM:  BP 116/80 (BP Location: Left Arm, Patient Position: Sitting, Cuff Size: Normal)   Pulse 70   Temp 99 F (37.2 C) (Oral)   Ht 5' 3" (1.6 m)   Wt 121 lb 3.2 oz (55 kg)   SpO2 99%   BMI 21.47 kg/m   Body mass index is 21.47 kg/m.  GENERAL: vitals reviewed and listed above, alert, oriented, appears well hydrated and in no acute distress HEENT: atraumatic, conjunctiva  clear, no obvious abnormalities on inspection of external nose and ears  NECK: no obvious masses on inspection palpation  LUNGS: clear to auscultation bilaterally, no wheezes, rales or rhonchi, good air movement CV: HRRR, no clubbing cyanosis or  peripheral edema nl cap refill  Abdomen soft without again a megaly guarding or rebound MS: moves all extremities without noticeable focal  abnormality PSYCH: pleasant and cooperative, no obvious depression or anxiety  BP Readings from Last 3 Encounters:  09/11/21 116/80  08/10/21 112/72  06/29/21 124/86    ASSESSMENT AND PLAN:  Discussed the following assessment and plan:  Diarrhea of presumed infectious origin - persisteng after travel  ? post infectious ibs? r/o giardia  can consider  empriric rx  in interim - Plan: Giardia/cryptosporidium (EIA)  History of foreign travel Diarrhea for 2 weeks associated with travel but not high risk area.  Consideration for traveler's diarrhea but atypical to last this long could be postinfectious irritable bowel, still consider Giardia type infection.  Options discussed watchful waiting but plans on traveling in the near future  which makes that problematic.  -Patient advised to return or notify health care team  if  new concerns arise.  Patient Instructions  This could be post infectious irritable bowel.   Can do empiric azithro  Probiotic ?    Immodium ok in interim .   Stool for giardia  test .    Wanda K. Panosh M.D. 

## 2021-09-11 NOTE — Patient Instructions (Signed)
This could be post infectious irritable bowel.   Can do empiric azithro  Probiotic ?    Immodium ok in interim .   Stool for giardia  test .

## 2021-09-12 ENCOUNTER — Other Ambulatory Visit: Payer: Medicare PPO

## 2021-09-12 DIAGNOSIS — R197 Diarrhea, unspecified: Secondary | ICD-10-CM

## 2021-09-13 ENCOUNTER — Ambulatory Visit: Payer: Medicare PPO | Admitting: Internal Medicine

## 2021-09-14 LAB — GIARDIA AND CRYPTOSPORIDIUM ANTIGEN PANEL
MICRO NUMBER:: 13433379
RESULT:: NOT DETECTED
SPECIMEN QUALITY:: ADEQUATE
Specimen Quality:: ADEQUATE
micro Number:: 13433378

## 2021-09-18 NOTE — Progress Notes (Signed)
Neg cryptosporidia on one sample    giardia  result not back

## 2021-09-19 ENCOUNTER — Ambulatory Visit: Payer: Medicare PPO | Admitting: Family Medicine

## 2021-09-19 ENCOUNTER — Encounter: Payer: Self-pay | Admitting: Family Medicine

## 2021-09-19 ENCOUNTER — Ambulatory Visit (INDEPENDENT_AMBULATORY_CARE_PROVIDER_SITE_OTHER): Payer: Medicare PPO

## 2021-09-19 VITALS — BP 114/78 | HR 64 | Ht 63.0 in | Wt 124.0 lb

## 2021-09-19 DIAGNOSIS — M9908 Segmental and somatic dysfunction of rib cage: Secondary | ICD-10-CM

## 2021-09-19 DIAGNOSIS — M9904 Segmental and somatic dysfunction of sacral region: Secondary | ICD-10-CM

## 2021-09-19 DIAGNOSIS — M9903 Segmental and somatic dysfunction of lumbar region: Secondary | ICD-10-CM

## 2021-09-19 DIAGNOSIS — M9902 Segmental and somatic dysfunction of thoracic region: Secondary | ICD-10-CM

## 2021-09-19 DIAGNOSIS — M545 Low back pain, unspecified: Secondary | ICD-10-CM | POA: Diagnosis not present

## 2021-09-19 DIAGNOSIS — M9901 Segmental and somatic dysfunction of cervical region: Secondary | ICD-10-CM | POA: Diagnosis not present

## 2021-09-19 DIAGNOSIS — M7062 Trochanteric bursitis, left hip: Secondary | ICD-10-CM | POA: Diagnosis not present

## 2021-09-19 MED ORDER — METHYLPREDNISOLONE ACETATE 40 MG/ML IJ SUSP
40.0000 mg | Freq: Once | INTRAMUSCULAR | Status: AC
  Administered 2021-09-19: 40 mg via INTRAMUSCULAR

## 2021-09-19 MED ORDER — PREDNISONE 20 MG PO TABS
40.0000 mg | ORAL_TABLET | Freq: Every day | ORAL | 0 refills | Status: DC
Start: 2021-09-19 — End: 2022-01-03

## 2021-09-19 MED ORDER — KETOROLAC TROMETHAMINE 30 MG/ML IJ SOLN
30.0000 mg | Freq: Once | INTRAMUSCULAR | Status: AC
  Administered 2021-09-19: 30 mg via INTRAMUSCULAR

## 2021-09-19 NOTE — Assessment & Plan Note (Signed)
Patient seems to have more of an exacerbation of her low back.  Including more in the trochanteric area but seem to be more over the left sacroiliac joint.  Get lumbar x-rays to further evaluate.  Toradol and Depo-Medrol given today secondary to the severity of the pain and given prednisone.  The patient is having a follow-up in the next week.  If worsening symptoms to seek medical attention immediately.  Going to [place] in 11 days

## 2021-09-19 NOTE — Progress Notes (Signed)
Tina [name] [address] [place] Phone: [phone] Subjective:   IVilma Meckel, am serving as a scribe for Dr. Hulan Saas.  I'm seeing this patient by the request  of:  Panosh, Standley Brooking, MD  CC: Neck and back pain follow-up  [id number]  Luci Rei is a 76 y.o. female coming in with complaint of back and neck pain. OMT 08/10/2021. Patient states Friday injured left side of low back picking up bag of mulch. Sharp pain sometimes radiated down leg. Took ibuprofen and helped a little, but not anymore.  Medications patient has been prescribed: Zanaflex  Taking:         Reviewed prior external information including notes and imaging from previsou exam, outside providers and external EMR if available.   As well as notes that were available from care everywhere and other healthcare systems.  Past medical history, social, surgical and family history all reviewed in electronic medical record.  No pertanent information unless stated regarding to the chief complaint.   Past Medical History:  Diagnosis Date   High triglycerides    Hx of varicella     No Known Allergies   Review of Systems:  No headache, visual changes, nausea, vomiting, diarrhea, constipation, dizziness, abdominal pain, skin rash, fevers, chills, night sweats, weight loss, swollen lymph nodes, body aches, joint swelling, chest pain, shortness of breath, mood changes. POSITIVE muscle aches  Objective  Blood pressure 114/78, pulse 64, height 5\' 3"  (1.6 m), weight 124 lb (56.2 kg), SpO2 99 %.   General: No apparent distress alert and oriented x3 mood and affect normal, dressed appropriately.  HEENT: Pupils equal, extraocular movements intact  Respiratory: Patient's speak in full sentences and does not appear short of breath  Cardiovascular: No lower extremity edema, non tender, no erythema  Back exam shows the patient does have severe tenderness noted in the  paraspinal musculature seems to be more left greater than right.  Negative straight leg test but patient is uncomfortable with any type of movement or twisting noted.  Pain seems to be over the sacroiliac joint  Osteopathic findings  C2 flexed rotated and side bent right C6 flexed rotated and side bent left T3 extended rotated and side bent right inhaled rib T8 extended rotated and side bent left L2 flexed rotated and side bent right Sacrum left on left       Assessment and Plan:  Greater trochanteric bursitis of left hip Patient seems to have more of an exacerbation of her low back.  Including more in the trochanteric area but seem to be more over the left sacroiliac joint.  Get lumbar x-rays to further evaluate.  Toradol and Depo-Medrol given today secondary to the severity of the pain and given prednisone.  The patient is having a follow-up in the next week.  If worsening symptoms to seek medical attention immediately.  Going to [place] in 11 days    Nonallopathic problems  Decision today to treat with OMT was based on Physical Exam  After verbal consent patient was treated with HVLA, ME, FPR techniques in cervical, rib, thoracic, lumbar, and sacral  areas  Patient tolerated the procedure well with improvement in symptoms  Patient given exercises, stretches and lifestyle modifications  See medications in patient instructions if given  Patient will follow up in 4-8 weeks     The above documentation has been reviewed and is accurate and complete Lyndal Pulley, DO  Note: This dictation was prepared with Dragon dictation along with smaller phrase technology. Any transcriptional errors that result from this process are unintentional.

## 2021-09-19 NOTE — Patient Instructions (Addendum)
Injection today Prescription filled Xray today  Zanaflex nightly for next 3 nights Keep next appointment

## 2021-09-26 NOTE — Progress Notes (Signed)
Tina [name] [address] [place] [address] Phone: [phone] Subjective:   [name], am serving as a scribe for Dr. Antoine Primas.  I'm seeing this patient by the request  of:  Panosh, Tina Mends, MD  CC: Neck and hip pain follow-up  [id number]  Tina [name] is a 76 y.o. female coming in with complaint of back and neck pain. OMT 09/19/2021. Also f/u for L GT pain. Patient states doing better. No new complaints.  Continues to have some discomfort overall.  Patient does state that she is somewhere between 70 and 90% better with the hip.  Mild increase in tightness of the neck but has not been doing the exercises as much.  Will be traveling.  Felt that the prednisone did help the pain but unfortunately did keep her up at night and did not like the side effects.  Medications patient has been prescribed: Prednisone  Taking:         Reviewed prior external information including notes and imaging from previsou exam, outside providers and external EMR if available.   As well as notes that were available from care everywhere and other healthcare systems.  Past medical history, social, surgical and family history all reviewed in electronic medical record.  No pertanent information unless stated regarding to the chief complaint.   Past Medical History:  Diagnosis Date   High triglycerides    Hx of varicella     No Known Allergies   Review of Systems:  No headache, visual changes, nausea, vomiting, diarrhea, constipation, dizziness, abdominal pain, skin rash, fevers, chills, night sweats, weight loss, swollen lymph nodes, body aches, joint swelling, chest pain, shortness of breath, mood changes. POSITIVE muscle aches  Objective  Blood pressure 112/80, pulse 69, height 5\' 3"  (1.6 m), weight 122 lb (55.3 kg), SpO2 99 %.   General: No apparent distress alert and oriented x3 mood and affect normal, dressed appropriately.  HEENT: Pupils equal,  extraocular movements intact  Respiratory: Patient's speak in full sentences and does not appear short of breath  Cardiovascular: No lower extremity edema, non tender, no erythema  Low back exam does have some loss of lordosis.  Some tenderness to palpation in the paraspinal musculature.  Patient does have mild pain over the greater trochanteric.  With a  Osteopathic findings  C2 flexed rotated and side bent left C6 flexed rotated and side bent left T3 extended rotated and side bent right inhaled rib T7 extended rotated and side bent left L2 flexed rotated and side bent right Sacrum right on right       Assessment and Plan:  Greater trochanteric bursitis of left hip Patient is making some improvement.  Discussed again about the possibility of injections if necessary.  Patient wants to continue to monitor at this point.  Has had it 2 times in 2 years.  We will continue to monitor.  Follow-up with me again in 6 to 8 weeks.  Has Zanaflex for breakthrough.  Neck pain on left side Chronic problem with mild worsening symptoms.  Likely has been compensating a little bit.  Responded well to osteopathic manipulation.  Patient declined prednisone for her trip.  Follow-up with me again in 4 to 6 weeks   Nonallopathic problems  Decision today to treat with OMT was based on Physical Exam  After verbal consent patient was treated with HVLA, ME, FPR techniques in cervical, rib, thoracic, lumbar, and sacral  areas  Patient tolerated the procedure  well with improvement in symptoms  Patient given exercises, stretches and lifestyle modifications  See medications in patient instructions if given  Patient will follow up in 4-8 weeks      The above documentation has been reviewed and is accurate and complete [name], DO        Note: This dictation was prepared with Dragon dictation along with smaller phrase technology. Any transcriptional errors that result from this process are  unintentional.

## 2021-09-28 ENCOUNTER — Ambulatory Visit: Payer: Medicare PPO | Admitting: Family Medicine

## 2021-09-28 VITALS — BP 112/80 | HR 69 | Ht 63.0 in | Wt 122.0 lb

## 2021-09-28 DIAGNOSIS — M9901 Segmental and somatic dysfunction of cervical region: Secondary | ICD-10-CM | POA: Diagnosis not present

## 2021-09-28 DIAGNOSIS — M7062 Trochanteric bursitis, left hip: Secondary | ICD-10-CM

## 2021-09-28 DIAGNOSIS — M9904 Segmental and somatic dysfunction of sacral region: Secondary | ICD-10-CM

## 2021-09-28 DIAGNOSIS — M9903 Segmental and somatic dysfunction of lumbar region: Secondary | ICD-10-CM | POA: Diagnosis not present

## 2021-09-28 DIAGNOSIS — M9902 Segmental and somatic dysfunction of thoracic region: Secondary | ICD-10-CM | POA: Diagnosis not present

## 2021-09-28 DIAGNOSIS — M542 Cervicalgia: Secondary | ICD-10-CM | POA: Diagnosis not present

## 2021-09-28 DIAGNOSIS — M9908 Segmental and somatic dysfunction of rib cage: Secondary | ICD-10-CM

## 2021-09-28 NOTE — Assessment & Plan Note (Signed)
Chronic problem with mild worsening symptoms.  Likely has been compensating a little bit.  Responded well to osteopathic manipulation.  Patient declined prednisone for her trip.  Follow-up with me again in 4 to 6 weeks

## 2021-09-28 NOTE — Assessment & Plan Note (Signed)
Patient is making some improvement.  Discussed again about the possibility of injections if necessary.  Patient wants to continue to monitor at this point.  Has had it 2 times in 2 years.  We will continue to monitor.  Follow-up with me again in 6 to 8 weeks.  Has Zanaflex for breakthrough.

## 2021-09-28 NOTE — Patient Instructions (Signed)
Good to see you! Have a wonderful travel experience See you again in 4-6 weeks

## 2021-10-11 DIAGNOSIS — H401131 Primary open-angle glaucoma, bilateral, mild stage: Secondary | ICD-10-CM | POA: Diagnosis not present

## 2021-10-27 NOTE — Progress Notes (Unsigned)
Tina [name] [address] [place] [address] Phone: [phone] Subjective:   Tina [name], am serving as a scribe for Dr. Antoine Primas.   I'm seeing this patient by the request  of:  Panosh, [name], MD  CC: Back and neck pain follow-up  [id number]  Tina [name] is a 76 y.o. female coming in with complaint of back and neck pain. OMT on 09/28/2021. Patient states that she is better. Hip has improved but still has pain in L SI. Patient has pain with lateral movements during dry land. The pain does not stop her but she does not want to aggravate this area.   Medications patient has been prescribed: None   Taking:         Reviewed prior external information including notes and imaging from previsou exam, outside providers and external EMR if available.   As well as notes that were available from care everywhere and other healthcare systems.  Past medical history, social, surgical and family history all reviewed in electronic medical record.  No pertanent information unless stated regarding to the chief complaint.   Past Medical History:  Diagnosis Date   High triglycerides    Hx of varicella     No Known Allergies   Review of Systems:  No headache, visual changes, nausea, vomiting, diarrhea, constipation, dizziness, abdominal pain, skin rash, fevers, chills, night sweats, weight loss, swollen lymph nodes, body aches, joint swelling, chest pain, shortness of breath, mood changes. POSITIVE muscle aches  Objective  Blood pressure 130/84, pulse 68, height 5\' 3"  (1.6 m), weight 124 lb (56.2 kg), SpO2 99 %.   General: No apparent distress alert and oriented x3 mood and affect normal, dressed appropriately.  HEENT: Pupils equal, extraocular movements intact  Respiratory: Patient's speak in full sentences and does not appear short of breath  Cardiovascular: No lower extremity edema, non tender, no erythema  Gait normal gait MSK:  Back  does have some loss of lordosis.  Some tenderness to palpation in the paraspinal musculature minorly.  Some increasing discomfort over the left sacroiliac joint  Osteopathic findings  C6 flexed rotated and side bent left T3 extended rotated and side bent to the left inhaled rib T9 extended rotated and side bent left L2 flexed rotated and side bent right Sacrum left on left       Assessment and Plan:  Left-sided back pain Patient did have more of a left-sided pain noted.  Discussed icing regimen and home exercises, which activities to do and which ones to avoid.  I do believe the patient is making some progress.  Has muscle relaxers for breakthrough.  Will be traveling and I do think patient should do relatively well.  Follow-up again in 6 to 8 weeks    Nonallopathic problems  Decision today to treat with OMT was based on Physical Exam  After verbal consent patient was treated with HVLA, ME, FPR techniques in cervical, rib, thoracic, lumbar, and sacral  areas  Patient tolerated the procedure well with improvement in symptoms  Patient given exercises, stretches and lifestyle modifications  See medications in patient instructions if given  Patient will follow up in 4-8 weeks      The above documentation has been reviewed and is accurate and complete , DO        Note: This dictation was prepared with Dragon dictation along with smaller phrase technology. Any transcriptional errors that result from this process are unintentional.

## 2021-11-02 ENCOUNTER — Encounter: Payer: Self-pay | Admitting: Family Medicine

## 2021-11-02 ENCOUNTER — Ambulatory Visit: Payer: Medicare PPO | Admitting: Family Medicine

## 2021-11-02 VITALS — BP 130/84 | HR 68 | Ht 63.0 in | Wt 124.0 lb

## 2021-11-02 DIAGNOSIS — M9901 Segmental and somatic dysfunction of cervical region: Secondary | ICD-10-CM

## 2021-11-02 DIAGNOSIS — M549 Dorsalgia, unspecified: Secondary | ICD-10-CM | POA: Insufficient documentation

## 2021-11-02 DIAGNOSIS — M9904 Segmental and somatic dysfunction of sacral region: Secondary | ICD-10-CM

## 2021-11-02 DIAGNOSIS — M9902 Segmental and somatic dysfunction of thoracic region: Secondary | ICD-10-CM | POA: Diagnosis not present

## 2021-11-02 DIAGNOSIS — M545 Low back pain, unspecified: Secondary | ICD-10-CM | POA: Diagnosis not present

## 2021-11-02 DIAGNOSIS — M9903 Segmental and somatic dysfunction of lumbar region: Secondary | ICD-10-CM | POA: Diagnosis not present

## 2021-11-02 DIAGNOSIS — M9908 Segmental and somatic dysfunction of rib cage: Secondary | ICD-10-CM | POA: Diagnosis not present

## 2021-11-02 NOTE — Patient Instructions (Signed)
Great to see you as always You shoud do great  Avoid excessive extension still with the back  See me after all the trips in 5-6 weeks

## 2021-11-02 NOTE — Assessment & Plan Note (Signed)
Patient did have more of a left-sided pain noted.  Discussed icing regimen and home exercises, which activities to do and which ones to avoid.  I do believe the patient is making some progress.  Has muscle relaxers for breakthrough.  Will be traveling and I do think patient should do relatively well.  Follow-up again in 6 to 8 weeks

## 2021-12-12 NOTE — Progress Notes (Unsigned)
  [name] [address] [place] [address] Phone: [phone] Subjective:   [name], am serving as a scribe for Dr. Antoine Primas.  I'm seeing this patient by the request  of:  Panosh, [name], MD  CC: Back and neck pain follow-up  [id number]  Kimi Bordeau is a 76 y.o. female coming in with complaint of back and neck pain. OMT 11/02/2021. Patient states same as usual. Here for manipulation. No new issues.   Medications patient has been prescribed: None  Taking:         Reviewed prior external information including notes and imaging from previsou exam, outside providers and external EMR if available.   As well as notes that were available from care everywhere and other healthcare systems.  Past medical history, social, surgical and family history all reviewed in electronic medical record.  No pertanent information unless stated regarding to the chief complaint.   Past Medical History:  Diagnosis Date   High triglycerides    Hx of varicella     No Known Allergies   Review of Systems:  No headache, visual changes, nausea, vomiting, diarrhea, constipation, dizziness, abdominal pain, skin rash, fevers, chills, night sweats, weight loss, swollen lymph nodes, body aches, joint swelling, chest pain, shortness of breath, mood changes. POSITIVE muscle aches  Objective  Blood pressure 128/82, pulse 71, height 5\' 3"  (1.6 m), weight 124 lb (56.2 kg), SpO2 97 %.   General: No apparent distress alert and oriented x3 mood and affect normal, dressed appropriately.  HEENT: Pupils equal, extraocular movements intact  Respiratory: Patient's speak in full sentences and does not appear short of breath  Cardiovascular: No lower extremity edema, non tender, no erythema  Gait MSK:  Back does have some mild loss of lordosis of the lumbar spine.  Mild tightness noted still of the hips bilaterally.  Patient noted does have tightness in the left  side of the neck noted.  Negative Spurling's.  Does have some limited sidebending bilaterally.  Osteopathic findings  C2 flexed rotated and side bent left C6 flexed rotated and side bent left T3 extended rotated and side bent left inhaled rib L2 flexed rotated and side bent right Sacrum right on right    Assessment and Plan:  Left-sided back pain Continues to have some intermittent tightness of the back as well as the hips and the neck.  Still nothing that is stopping her from activity though.  Is been traveling recently and got out of patient's routine.  Hopefully she will be able to start increasing activity as well.  Follow-up with me again in 6 to 8 weeks.    Nonallopathic problems  Decision today to treat with OMT was based on Physical Exam  After verbal consent patient was treated with HVLA, ME, FPR techniques in cervical, rib, thoracic, lumbar, and sacral  areas  Patient tolerated the procedure well with improvement in symptoms  Patient given exercises, stretches and lifestyle modifications  See medications in patient instructions if given  Patient will follow up in 4-8 weeks    The above documentation has been reviewed and is accurate and complete , DO          Note: This dictation was prepared with Dragon dictation along with smaller phrase technology. Any transcriptional errors that result from this process are unintentional.

## 2021-12-14 ENCOUNTER — Ambulatory Visit: Payer: Medicare PPO | Admitting: Family Medicine

## 2021-12-14 VITALS — BP 128/82 | HR 71 | Ht 63.0 in | Wt 124.0 lb

## 2021-12-14 DIAGNOSIS — M9902 Segmental and somatic dysfunction of thoracic region: Secondary | ICD-10-CM | POA: Diagnosis not present

## 2021-12-14 DIAGNOSIS — M9901 Segmental and somatic dysfunction of cervical region: Secondary | ICD-10-CM | POA: Diagnosis not present

## 2021-12-14 DIAGNOSIS — M9904 Segmental and somatic dysfunction of sacral region: Secondary | ICD-10-CM

## 2021-12-14 DIAGNOSIS — M545 Low back pain, unspecified: Secondary | ICD-10-CM | POA: Diagnosis not present

## 2021-12-14 DIAGNOSIS — M9903 Segmental and somatic dysfunction of lumbar region: Secondary | ICD-10-CM | POA: Diagnosis not present

## 2021-12-14 DIAGNOSIS — M9908 Segmental and somatic dysfunction of rib cage: Secondary | ICD-10-CM | POA: Diagnosis not present

## 2021-12-14 NOTE — Patient Instructions (Addendum)
Good to see you! For dad likely need repeat xray in 2 weeks Question bone density, vit d, calcium For you get back to your routine

## 2021-12-14 NOTE — Assessment & Plan Note (Signed)
Continues to have some intermittent tightness of the back as well as the hips and the neck.  Still nothing that is stopping her from activity though.  Is been traveling recently and got out of patient's routine.  Hopefully she will be able to start increasing activity as well.  Follow-up with me again in 6 to 8 weeks.

## 2021-12-21 ENCOUNTER — Ambulatory Visit (INDEPENDENT_AMBULATORY_CARE_PROVIDER_SITE_OTHER): Payer: Medicare PPO

## 2021-12-21 VITALS — Ht 63.5 in | Wt 124.0 lb

## 2021-12-21 DIAGNOSIS — Z0000 Encounter for general adult medical examination without abnormal findings: Secondary | ICD-10-CM

## 2021-12-21 NOTE — Patient Instructions (Addendum)
Tina [name] , Thank you for taking time to come for your Medicare Wellness Visit. I appreciate your ongoing commitment to your health goals. Please review the following plan we discussed and let me know if I can assist you in the future.   Screening recommendations/referrals: Colonoscopy: Done 09/12/12 Repeat 10 yrs Mammogram: Scheduled 12/28/21 Bone Density: Done 12/22/20 Recommended yearly ophthalmology/optometry visit for glaucoma screening and checkup Recommended yearly dental visit for hygiene and checkup  Vaccinations: Influenza vaccine: Up to Date Pneumococcal vaccine: Up to Date Tdap vaccine: Up to Date Shingles vaccine: Done   Covid-19:Done  Advanced directives: Please bring a copy of your health care power of attorney and living will to the office to be added to your chart at your convenience.   Conditions/risks identified: None  Next appointment: Follow up in one year for your annual wellness visit     Preventive Care 65 Years and Older, Female Preventive care refers to lifestyle choices and visits with your health care provider that can promote health and wellness. What does preventive care include? A yearly physical exam. This is also called an annual well check. Dental exams once or twice a year. Routine eye exams. Ask your health care provider how often you should have your eyes checked. Personal lifestyle choices, including: Daily care of your teeth and gums. Regular physical activity. Eating a healthy diet. Avoiding tobacco and drug use. Limiting alcohol use. Practicing safe sex. Taking low-dose aspirin every day. Taking vitamin and mineral supplements as recommended by your health care provider. What happens during an annual well check? The services and screenings done by your health care provider during your annual well check will depend on your age, overall health, lifestyle risk factors, and family history of disease. Counseling  Your health care provider may  ask you questions about your: Alcohol use. Tobacco use. Drug use. Emotional well-being. Home and relationship well-being. Sexual activity. Eating habits. History of falls. Memory and ability to understand (cognition). Work and work Astronomer. Reproductive health. Screening  You may have the following tests or measurements: Height, weight, and BMI. Blood pressure. Lipid and cholesterol levels. These may be checked every 5 years, or more frequently if you are over 38 years old. Skin check. Lung cancer screening. You may have this screening every year starting at age 9 if you have a 30-pack-year history of smoking and currently smoke or have quit within the past 15 years. Fecal occult blood test (FOBT) of the stool. You may have this test every year starting at age 64. Flexible sigmoidoscopy or colonoscopy. You may have a sigmoidoscopy every 5 years or a colonoscopy every 10 years starting at age 48. Hepatitis C blood test. Hepatitis B blood test. Sexually transmitted disease (STD) testing. Diabetes screening. This is done by checking your blood sugar (glucose) after you have not eaten for a while (fasting). You may have this done every 1-3 years. Bone density scan. This is done to screen for osteoporosis. You may have this done starting at age 67. Mammogram. This may be done every 1-2 years. Talk to your health care provider about how often you should have regular mammograms. Talk with your health care provider about your test results, treatment options, and if necessary, the need for more tests. Vaccines  Your health care provider may recommend certain vaccines, such as: Influenza vaccine. This is recommended every year. Tetanus, diphtheria, and acellular pertussis (Tdap, Td) vaccine. You may need a Td booster every 10 years. Zoster vaccine. You may need  this after age 61. Pneumococcal 13-valent conjugate (PCV13) vaccine. One dose is recommended after age 52. Pneumococcal  polysaccharide (PPSV23) vaccine. One dose is recommended after age 30. Talk to your health care provider about which screenings and vaccines you need and how often you need them. This information is not intended to replace advice given to you by your health care provider. Make sure you discuss any questions you have with your health care provider. Document Released: 05/06/2015 Document Revised: 12/28/2015 Document Reviewed: 02/08/2015 Elsevier Interactive Patient Education  2017 [place] Prevention in the Home Falls can cause injuries. They can happen to people of all ages. There are many things you can do to make your home safe and to help prevent falls. What can I do on the outside of my home? Regularly fix the edges of walkways and driveways and fix any cracks. Remove anything that might make you trip as you walk through a door, such as a raised step or threshold. Trim any bushes or trees on the path to your home. Use bright outdoor lighting. Clear any walking paths of anything that might make someone trip, such as rocks or tools. Regularly check to see if handrails are loose or broken. Make sure that both sides of any steps have handrails. Any raised decks and porches should have guardrails on the edges. Have any leaves, snow, or ice cleared regularly. Use sand or salt on walking paths during winter. Clean up any spills in your garage right away. This includes oil or grease spills. What can I do in the bathroom? Use night lights. Install grab bars by the toilet and in the tub and shower. Do not use towel bars as grab bars. Use non-skid mats or decals in the tub or shower. If you need to sit down in the shower, use a plastic, non-slip stool. Keep the floor dry. Clean up any water that spills on the floor as soon as it happens. Remove soap buildup in the tub or shower regularly. Attach bath mats securely with double-sided non-slip rug tape. Do not have throw rugs and other  things on the floor that can make you trip. What can I do in the bedroom? Use night lights. Make sure that you have a light by your bed that is easy to reach. Do not use any sheets or blankets that are too big for your bed. They should not hang down onto the floor. Have a firm chair that has side arms. You can use this for support while you get dressed. Do not have throw rugs and other things on the floor that can make you trip. What can I do in the kitchen? Clean up any spills right away. Avoid walking on wet floors. Keep items that you use a lot in easy-to-reach places. If you need to reach something above you, use a strong step stool that has a grab bar. Keep electrical cords out of the way. Do not use floor polish or wax that makes floors slippery. If you must use wax, use non-skid floor wax. Do not have throw rugs and other things on the floor that can make you trip. What can I do with my stairs? Do not leave any items on the stairs. Make sure that there are handrails on both sides of the stairs and use them. Fix handrails that are broken or loose. Make sure that handrails are as long as the stairways. Check any carpeting to make sure that it is firmly attached to  the stairs. Fix any carpet that is loose or worn. Avoid having throw rugs at the top or bottom of the stairs. If you do have throw rugs, attach them to the floor with carpet tape. Make sure that you have a light switch at the top of the stairs and the bottom of the stairs. If you do not have them, ask someone to add them for you. What else can I do to help prevent falls? Wear shoes that: Do not have high heels. Have rubber bottoms. Are comfortable and fit you well. Are closed at the toe. Do not wear sandals. If you use a stepladder: Make sure that it is fully opened. Do not climb a closed stepladder. Make sure that both sides of the stepladder are locked into place. Ask someone to hold it for you, if possible. Clearly  mark and make sure that you can see: Any grab bars or handrails. First and last steps. Where the edge of each step is. Use tools that help you move around (mobility aids) if they are needed. These include: Canes. Walkers. Scooters. Crutches. Turn on the lights when you go into a dark area. Replace any light bulbs as soon as they burn out. Set up your furniture so you have a clear path. Avoid moving your furniture around. If any of your floors are uneven, fix them. If there are any pets around you, be aware of where they are. Review your medicines with your doctor. Some medicines can make you feel dizzy. This can increase your chance of falling. Ask your doctor what other things that you can do to help prevent falls. This information is not intended to replace advice given to you by your health care provider. Make sure you discuss any questions you have with your health care provider. Document Released: 02/03/2009 Document Revised: 09/15/2015 Document Reviewed: 05/14/2014 Elsevier Interactive Patient Education  2017 [employer].

## 2021-12-21 NOTE — Progress Notes (Signed)
Subjective:   Tina [name] is a 76 y.o. female who presents for Medicare Annual (Subsequent) preventive examination.  Review of Systems    Virtual Visit via Telephone Note  I connected with  Tina [name] on 12/21/21 at  8:15 AM EDT by telephone and verified that I am speaking with the correct person using two identifiers.  Location: Patient: Home Provider: Office Persons participating in the virtual visit: patient/Nurse Health Advisor   I discussed the limitations, risks, security and privacy concerns of performing an evaluation and management service by telephone and the availability of in person appointments. The patient expressed understanding and agreed to proceed.  Interactive audio and video telecommunications were attempted between this nurse and patient, however failed, due to patient having technical difficulties OR patient did not have access to video capability.  We continued and completed visit with audio only.  Some vital signs may be absent or patient reported.   Tillie RungBeverly W Biddie Sebek, LPN  Cardiac Risk Factors include: advanced age (>5955men, 42>65 women)     Objective:    Today's Vitals   12/21/21 0822  Weight: 124 lb (56.2 kg)  Height: 5' 3.5" (1.613 m)   Body mass index is 21.62 kg/m.     12/21/2021    8:31 AM 12/20/2020    3:17 PM 11/16/2019   11:23 AM 07/26/2016    1:52 PM  Advanced Directives  Does Patient Have a Medical Advance Directive? Yes Yes Yes Yes  Type of Estate agentAdvance Directive Healthcare Power of Ring[place]orney;Living will Healthcare Power of [place]orney;Living will Healthcare Power of [place]orney;Living will   Does patient want to make changes to medical advance directive?   No - Patient declined   Copy of Healthcare Power of Attorney in Chart? No - copy requested No - copy requested Yes - validated most recent copy scanned in chart (See row information)     Current Medications (verified) Outpatient Encounter Medications as of 12/21/2021  Medication Sig   cetirizine  (ZYRTEC) 10 MG tablet Take 10 mg by mouth daily.   cholecalciferol (VITAMIN D) 1000 units tablet Take 1,000 Units by mouth daily.   COVID-19 mRNA vaccine, Moderna, 100 MCG/0.5ML injection Inject into the muscle.   famotidine (PEPCID) 20 MG tablet Take 20 mg by mouth 2 (two) times daily.   Latanoprost 0.005 % EMUL Place into both eyes at bedtime.   MULTIPLE VITAMIN PO Take by mouth.   Omega-3 Fatty Acids (OMEGA 3 PO) Take by mouth.   predniSONE (DELTASONE) 20 MG tablet Take 2 tablets (40 mg total) by mouth daily with breakfast.   tiZANidine (ZANAFLEX) 4 MG tablet Take 1 tablet (4 mg total) by mouth at bedtime.   TURMERIC PO Take by mouth.   Vitamin D, Ergocalciferol, (DRISDOL) 1.25 MG (50000 UNIT) CAPS capsule Take 1 capsule (50,000 Units total) by mouth every 7 (seven) days.   No facility-administered encounter medications on file as of 12/21/2021.    Allergies (verified) Patient has no known allergies.   History: Past Medical History:  Diagnosis Date   High triglycerides    Hx of varicella    Past Surgical History:  Procedure Laterality Date   CHOLECYSTECTOMY     Family History  Problem Relation Age of Onset   Hypertension Mother    Hyperlipidemia Mother    Stroke Mother    Diabetes Father    Miscarriages / [place]birth[place] Brother    Social History   Socioeconomic History   Marital status: Divorced    Spouse name: Not  on file   Number of children: Not on file   Years of education: Not on file   Highest education level: Doctorate  Occupational History   Occupation: Professor    Employer: [employer]  Tobacco Use   Smoking status: Former    Packs/day: 0.70    Years: 4.00    Total pack years: 2.80    Types: Cigarettes   Smokeless tobacco: Never   Tobacco comments:    in her 20's   Vaping Use   Vaping Use: Never used  Substance and Sexual Activity   Alcohol use: Yes    Alcohol/week: 1.0 standard drink of alcohol    Types: 1 Standard drinks or equivalent per  week    Comment: ocassionally   Drug use: No   Sexual activity: Not on file  Other Topics Concern   Not on file  Social History Narrative    Now [place] [facility] no pets    .    Mother  Moved down to friends home        Divorced one chid and grandchild   Neg ets firearms.    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    Exercises regularly      Neg tad    Social Determinants of Health   Financial Resource Strain: Low Risk  (12/21/2021)   Overall Financial Resource Strain (CARDIA)    Difficulty of Paying Living Expenses: Not hard at all  Food Insecurity: No Food Insecurity (12/21/2021)   Hunger Vital Sign    Worried About Running Out of Food in the Last Year: Never true    Ran Out of Food in the Last Year: Never true  Transportation Needs: No Transportation Needs (12/21/2021)   PRAPARE - Administrator, Civil Service (Medical): No    Lack of Transportation (Non-Medical): No  Physical Activity: Sufficiently Active (12/21/2021)   Exercise Vital Sign    Days of Exercise per Week: 7 days    Minutes of Exercise per Session: 60 min  Stress: No Stress Concern Present (12/21/2021)   [employer] of Occupational Health - Occupational Stress Questionnaire    Feeling of Stress : Not at all  Social Connections: Moderately Integrated (12/21/2021)   Social Connection and Isolation Panel [NHANES]    Frequency of Communication with Friends and Family: More than three times a week    Frequency of Social Gatherings with Friends and Family: More than three times a week    Attends Religious Services: Never    Database administrator or Organizations: Yes    Attends Engineer, structural: More than 4 times per year    Marital Status: Married    Tobacco Counseling Counseling given: Not Answered Tobacco comments: in her 20's    Clinical Intake:  Pre-visit preparation completed: No  Pain : No/denies  pain     BMI - recorded: 21.97 Diabetes: No  How often do you need to have someone help you when you read instructions, pamphlets, or other written materials from your doctor or pharmacy?: 1 - Never  Diabetic?  No  Interpreter Needed?: No  Information entered by :: Theresa Mulligan LPN   Activities of Daily Living    12/21/2021    8:27 AM  In your present state of health, do you have any difficulty performing the following activities:  Hearing?  0  Vision? 0  Difficulty concentrating or making decisions? 0  Walking or climbing stairs? 0  Dressing or bathing? 0  Doing errands, shopping? 0  Preparing Food and eating ? N  Using the Toilet? N  In the past six months, have you accidently leaked urine? N  Do you have problems with loss of bowel control? N  Managing your Medications? N  Managing your Finances? N  Housekeeping or managing your Housekeeping? N    Patient Care Team: Panosh, [name], MD as PCP - General (Internal Medicine) Campbell Stall, MD as Attending Physician (Dermatology) Elliot Cousin, OD (Optometry)  Indicate any recent Medical Services you may have received from other than [facility] providers in the past year (date may be approximate).     Assessment:   This is a routine wellness examination for Tina [name].  Hearing/Vision screen Hearing Screening - Comments:: Denies hearing difficulties   Vision Screening - Comments:: Wears rx glasses - up to date with routine eye exams with  [facility]  Dietary issues and exercise activities discussed: Current Exercise Habits: Home exercise routine, Type of exercise: walking, Time (Minutes): 60, Frequency (Times/Week): 7, Weekly Exercise (Minutes/Week): 420, Intensity: Moderate, Exercise limited by: None identified   Goals Addressed               This Visit's Progress     No current goals (pt-stated)        Complete my bucket list.       Depression Screen    12/21/2021    8:26 AM 09/11/2021   12:24 PM  12/20/2020    3:18 PM 12/20/2020    3:15 PM 11/16/2019   11:26 AM 11/16/2019    9:26 AM 11/14/2018    9:59 AM  PHQ 2/9 Scores  PHQ - 2 Score 0 0 0 0 0 0 0  PHQ- 9 Score  0   0 0     Fall Risk    12/21/2021    8:29 AM 09/11/2021   12:23 PM 09/10/2021    8:16 AM 12/20/2020    3:18 PM 11/16/2019   11:24 AM  Fall Risk   Falls in the past year? 0 0 0   0 0 0  Number falls in past yr: 0 0  0 0  Injury with Fall? 0 0  0 0  Risk for fall due to : No Fall Risks No Fall Risks  No Fall Risks No Fall Risks  Follow up  Falls evaluation completed  Falls evaluation completed Falls evaluation completed;Falls prevention discussed    FALL RISK PREVENTION PERTAINING TO THE HOME:  Any stairs in or around the home? Yes  If so, are there any without handrails? No  Home free of loose throw rugs in walkways, pet beds, electrical cords, etc? Yes  Adequate lighting in your home to reduce risk of falls? Yes   ASSISTIVE DEVICES UTILIZED TO PREVENT FALLS:  Life alert? No  Use of a cane, walker or w/c? No  Grab bars in the bathroom? Yes  Shower chair or bench in shower? No  Elevated toilet seat or a handicapped toilet? No   TIMED UP AND GO:  Was the test performed? No . Audio Visit   Cognitive Function:        12/21/2021    8:31 AM 11/16/2019   11:28 AM  6CIT Screen  What Year? 0 points 0 points  What month? 0 points 0 points  What time? 0 points 0 points  Count back from 20 0 points 0 points  Months in reverse 0 points 0 points  Repeat phrase 0 points 0 points  Total Score 0 points 0 points    Immunizations Immunization History  Administered Date(s) Administered   Fluad Quad(high Dose 65+) 01/07/2019   Hepatitis B 09/09/1995, 10/11/1995, 07/03/1996   Influenza Split 01/22/2012   Influenza, High Dose Seasonal PF 03/02/2015, 02/03/2016, 01/14/2017, 02/03/2018   Influenza-Unspecified 02/03/2014, 01/14/2017   Moderna SARS-COV2 Booster Vaccination 08/31/2020   PFIZER(Purple Top)SARS-COV-2  Vaccination 05/12/2019, 05/31/2019   Pneumococcal Conjugate-13 07/07/2012   Pneumococcal Polysaccharide-23 03/27/1999, 07/21/2013   Td 11/21/2016   Tdap 03/18/2006   Zoster Recombinat (Shingrix) 01/14/2017   Zoster, Live 05/12/2008    TDAP status: Up to date  Flu Vaccine status: Up to date  Pneumococcal vaccine status: Up to date  Covid-19 vaccine status: Completed vaccines  Qualifies for Shingles Vaccine? Yes   Zostavax completed Yes   Shingrix Completed?: Yes  Screening Tests Health Maintenance  Topic Date Due   INFLUENZA VACCINE  11/21/2021   MAMMOGRAM  12/28/2021 (Originally 12/06/2021)   HEMOGLOBIN A1C  03/14/2022 (Originally 05/18/2020)   OPHTHALMOLOGY EXAM  03/14/2022 (Originally 11/21/2016)   COVID-19 Vaccine (4 - Pfizer risk series) 03/14/2022 (Originally 04/28/2021)   Zoster Vaccines- Shingrix (2 of 2) 03/14/2022 (Originally 03/11/2017)   COLONOSCOPY (Pts 45-40yrs Insurance coverage will need to be confirmed)  09/13/2022   TETANUS/TDAP  11/22/2026   Pneumonia Vaccine 81+ Years old  Completed   DEXA SCAN  Completed   Hepatitis C Screening  Completed   HPV VACCINES  Aged Out    Health Maintenance  Health Maintenance Due  Topic Date Due   INFLUENZA VACCINE  11/21/2021    Colorectal cancer screening: Type of screening: Colonoscopy. Completed 09/12/12. Repeat every 10 years  Mammogram status: Ordered Scheduled for 12/28/21. Pt provided with contact info and advised to call to schedule appt.   Bone Density status: Completed 12/22/20. Results reflect: Bone density results: OSTEOPOROSIS. Repeat every   years.  Lung Cancer Screening: (Low Dose CT Chest recommended if Age 46-80 years, 30 pack-year currently smoking OR have quit w/in 15years.) does not qualify.    Additional Screening:  Hepatitis C Screening: does qualify; Completed 11/16/19  Vision Screening: Recommended annual ophthalmology exams for early detection of glaucoma and other disorders of the eye. Is the  patient up to date with their annual eye exam?  Yes [facility]  If pt is not established with a provider, would they like to be referred to a provider to establish care? No .   Dental Screening: Recommended annual dental exams for proper oral hygiene  Community Resource Referral / Chronic Care Management:  CRR required this visit?  No   CCM required this visit?  No      Plan:     I have personally reviewed and noted the following in the patient's chart:   Medical and social history Use of alcohol, tobacco or illicit drugs  Current medications and supplements including opioid prescriptions. Patient is not currently taking opioid prescriptions. Functional ability and status Nutritional status Physical activity Advanced directives List of other physicians Hospitalizations, surgeries, and ER visits in previous 12 months Vitals Screenings to include cognitive, depression, and falls Referrals and appointments  In addition, I have reviewed and discussed with patient certain preventive protocols, quality metrics, and best practice recommendations. A written personalized care plan for preventive services as well as general preventive health recommendations were provided to  patient.     Tillie Rung, LPN   7/37/1062   Nurse Notes: None

## 2021-12-28 DIAGNOSIS — Z1231 Encounter for screening mammogram for malignant neoplasm of breast: Secondary | ICD-10-CM | POA: Diagnosis not present

## 2021-12-28 LAB — HM MAMMOGRAPHY

## 2022-01-03 ENCOUNTER — Ambulatory Visit (INDEPENDENT_AMBULATORY_CARE_PROVIDER_SITE_OTHER): Payer: Medicare PPO | Admitting: Internal Medicine

## 2022-01-03 ENCOUNTER — Encounter: Payer: Self-pay | Admitting: Internal Medicine

## 2022-01-03 VITALS — BP 136/80 | HR 69 | Temp 98.0°F | Ht 63.25 in | Wt 122.2 lb

## 2022-01-03 DIAGNOSIS — E782 Mixed hyperlipidemia: Secondary | ICD-10-CM

## 2022-01-03 DIAGNOSIS — R7301 Impaired fasting glucose: Secondary | ICD-10-CM

## 2022-01-03 DIAGNOSIS — Z79899 Other long term (current) drug therapy: Secondary | ICD-10-CM

## 2022-01-03 DIAGNOSIS — Z0000 Encounter for general adult medical examination without abnormal findings: Secondary | ICD-10-CM | POA: Diagnosis not present

## 2022-01-03 LAB — BASIC METABOLIC PANEL
BUN: 20 mg/dL (ref 6–23)
CO2: 26 mEq/L (ref 19–32)
Calcium: 9.7 mg/dL (ref 8.4–10.5)
Chloride: 103 mEq/L (ref 96–112)
Creatinine, Ser: 0.85 mg/dL (ref 0.40–1.20)
GFR: 67.21 mL/min (ref >60.00)
Glucose, Bld: 95 mg/dL (ref 70–99)
Potassium: 4.3 mEq/L (ref 3.5–5.1)
Sodium: 138 mEq/L (ref 135–145)

## 2022-01-03 LAB — CBC WITH DIFFERENTIAL/PLATELET
Basophils Absolute: 0 10*3/uL (ref 0.0–0.1)
Basophils Relative: 1 % (ref 0.0–3.0)
Eosinophils Absolute: 0.1 10*3/uL (ref 0.0–0.7)
Eosinophils Relative: 2.7 % (ref 0.0–5.0)
HCT: 39.8 % (ref 36.0–46.0)
Hemoglobin: 13.3 g/dL (ref 12.0–15.0)
Lymphocytes Relative: 31.2 % (ref 12.0–46.0)
Lymphs Abs: 1.5 10*3/uL (ref 0.7–4.0)
MCHC: 33.5 g/dL (ref 30.0–36.0)
MCV: 87.6 fl (ref 78.0–100.0)
Monocytes Absolute: 0.3 10*3/uL (ref 0.1–1.0)
Monocytes Relative: 7.3 % (ref 3.0–12.0)
Neutro Abs: 2.7 10*3/uL (ref 1.4–7.7)
Neutrophils Relative %: 57.8 % (ref 43.0–77.0)
Platelets: 270 10*3/uL (ref 150.0–400.0)
RBC: 4.54 Mil/uL (ref 3.87–5.11)
RDW: 13.9 % (ref 11.5–15.5)
WBC: 4.7 10*3/uL (ref 4.0–10.5)

## 2022-01-03 LAB — LIPID PANEL
Cholesterol: 219 mg/dL — ABNORMAL HIGH (ref 0–200)
HDL: 69.2 mg/dL (ref >39.00)
LDL Cholesterol: 135 mg/dL — ABNORMAL HIGH (ref 0–99)
NonHDL: 149.96
Total CHOL/HDL Ratio: 3
Triglycerides: 74 mg/dL (ref 0.0–149.0)
VLDL: 14.8 mg/dL (ref 0.0–40.0)

## 2022-01-03 LAB — HEMOGLOBIN A1C: Hgb A1c MFr Bld: 6 % (ref 4.6–6.5)

## 2022-01-03 LAB — HEPATIC FUNCTION PANEL
ALT: 17 U/L (ref 0–35)
AST: 19 U/L (ref 0–37)
Albumin: 4.1 g/dL (ref 3.5–5.2)
Alkaline Phosphatase: 59 U/L (ref 39–117)
Bilirubin, Direct: 0.1 mg/dL (ref 0.0–0.3)
Total Bilirubin: 0.7 mg/dL (ref 0.2–1.2)
Total Protein: 7.2 g/dL (ref 6.0–8.3)

## 2022-01-03 NOTE — Progress Notes (Signed)
Blood sugar stable a1c in prediabetc range  stable  Cholesterol  slightly higher   but favorable ratio.    Continueattention to  lifestyle intervention healthy eating and exercise .

## 2022-01-03 NOTE — Progress Notes (Signed)
Chief Complaint  Patient presents with   Annual Exam    HPI: Patient  Tina [name]  76 y.o. comes in today for [place] visit  Doing well     Lipid up in past  working on healthy eating and activity  Had mammogram last week  not sure results   Health Maintenance  Topic Date Due   Diabetic kidney evaluation - Urine ACR  07/11/2016   Diabetic kidney evaluation - GFR measurement  11/15/2020   MAMMOGRAM  12/06/2021   HEMOGLOBIN A1C  03/14/2022 (Originally 05/18/2020)   OPHTHALMOLOGY EXAM  03/14/2022 (Originally 11/21/2016)   COVID-19 Vaccine (4 - Pfizer risk series) 03/14/2022 (Originally 04/28/2021)   Zoster Vaccines- Shingrix (2 of 2) 03/14/2022 (Originally 03/11/2017)   INFLUENZA VACCINE  07/22/2022 (Originally 11/21/2021)   COLONOSCOPY (Pts 45-33yrs Insurance coverage will need to be confirmed)  09/13/2022   TETANUS/TDAP  11/22/2026   Pneumonia Vaccine 38+ Years old  Completed   DEXA SCAN  Completed   Hepatitis C Screening  Completed   HPV VACCINES  Aged Out   Health Maintenance Review LIFESTYLE:  Exercise:  active zumba and silver  sneakers  Tobacco/ETS: no Alcohol:  hardly ever  Sugar beverages: Sleep:about 7 hours  Drug use: no Tumeric  HH of 1 dog  Dexa -2.5 spine   ROS:  REST of 12 system review negative except as per HPI  allergy MS  Fa age 34 fell  and cracked hip and pelvis  doing ok now .   Past Medical History:  Diagnosis Date   High triglycerides    Hx of varicella     Past Surgical History:  Procedure Laterality Date   CHOLECYSTECTOMY      Family History  Problem Relation Age of Onset   Hypertension Mother    Hyperlipidemia Mother    Stroke Mother    Diabetes Father    Miscarriages / [place] Brother     Social History   Socioeconomic History   Marital status: Divorced    Spouse name: Not on file   Number of children: Not on file   Years of education: Not on file   Highest education level: Doctorate  Occupational  History   Occupation: Professor    Employer: [employer]  Tobacco Use   Smoking status: Former    Packs/day: 0.70    Years: 4.00    Total pack years: 2.80    Types: Cigarettes   Smokeless tobacco: Never   Tobacco comments:    in her 20's   Vaping Use   Vaping Use: Never used  Substance and Sexual Activity   Alcohol use: Yes    Alcohol/week: 1.0 standard drink of alcohol    Types: 1 Standard drinks or equivalent per week    Comment: ocassionally   Drug use: No   Sexual activity: Not on file  Other Topics Concern   Not on file  Social History Narrative    Now h[facility] 1 no pets    .    Mother  Moved down to friends home        Divorced one chid and grandchild   Neg ets firearms.    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    Retire this summer 16   Educ [place] leave.  This semester.    Exercises regularly      Neg tad    Social Determinants of Radio broadcast assistant  Strain: Low Risk  (12/21/2021)   Overall Financial Resource Strain (CARDIA)    Difficulty of Paying Living Expenses: Not hard at all  Food Insecurity: No Food Insecurity (12/21/2021)   Hunger Vital Sign    Worried About Running Out of Food in the Last Year: Never true    Ran Out of Food in the Last Year: Never true  Transportation Needs: No Transportation Needs (12/21/2021)   PRAPARE - Administrator, Civil Service (Medical): No    Lack of Transportation (Non-Medical): No  Physical Activity: Sufficiently Active (12/21/2021)   Exercise Vital Sign    Days of Exercise per Week: 7 days    Minutes of Exercise per Session: 60 min  Stress: No Stress Concern Present (12/21/2021)   [employer] of Occupational Health - Occupational Stress Questionnaire    Feeling of Stress : Not at all  Social Connections: Moderately Integrated (12/21/2021)   Social Connection and Isolation Panel [NHANES]    Frequency of Communication with Friends and Family: More than three times a week     Frequency of Social Gatherings with Friends and Family: More than three times a week    Attends Religious Services: Never    Database administrator or Organizations: Yes    Attends Engineer, structural: More than 4 times per year    Marital Status: Married    Outpatient Medications Prior to Visit  Medication Sig Dispense Refill   CALCIUM CITRATE PO Take by mouth.     cetirizine (ZYRTEC) 10 MG tablet Take 10 mg by mouth daily.     cholecalciferol (VITAMIN D) 1000 units tablet Take 1,000 Units by mouth daily.     famotidine (PEPCID) 20 MG tablet Take 20 mg by mouth 2 (two) times daily.     Latanoprost 0.005 % EMUL Place into both eyes at bedtime.     MULTIPLE VITAMIN PO Take by mouth.     Omega-3 Fatty Acids (OMEGA 3 PO) Take by mouth.     tiZANidine (ZANAFLEX) 4 MG tablet Take 1 tablet (4 mg total) by mouth at bedtime. (Patient taking differently: Take 4 mg by mouth. PRN) 90 tablet 1   TURMERIC PO Take by mouth.     COVID-19 mRNA vaccine, Moderna, 100 MCG/0.5ML injection Inject into the muscle. 0.25 mL 0   predniSONE (DELTASONE) 20 MG tablet Take 2 tablets (40 mg total) by mouth daily with breakfast. 10 tablet 0   Vitamin D, Ergocalciferol, (DRISDOL) 1.25 MG (50000 UNIT) CAPS capsule Take 1 capsule (50,000 Units total) by mouth every 7 (seven) days. 4 capsule 0   No facility-administered medications prior to visit.     EXAM:  BP 136/80 (BP Location: Left Arm, Patient Position: Sitting, Cuff Size: Normal)   Pulse 69   Temp 98 F (36.7 C) (Oral)   Ht 5' 3.25" (1.607 m)   Wt 122 lb 3.2 oz (55.4 kg)   SpO2 98%   BMI 21.48 kg/m   Body mass index is 21.48 kg/m. Wt Readings from Last 3 Encounters:  01/03/22 122 lb 3.2 oz (55.4 kg)  12/21/21 124 lb (56.2 kg)  12/14/21 124 lb (56.2 kg)    Physical Exam: Vital signs reviewed XTG:GYIR is a well-developed well-nourished alert cooperative    who appearsr stated age in no acute distress.  HEENT: normocephalic atraumatic ,  Eyes: PERRL EOM's full, conjunctiva clear, Nares: paten,t no deformity discharge or tenderness., Ears: no deformity EAC's clear TMs with normal landmarks. Mouth: clear  OP, no lesions, edema.  Moist mucous membranes. Dentition in adequate repair. NECK: supple without masses, thyromegaly or bruits. CHEST/PULM:  Clear to auscultation and percussion breath sounds equal no wheeze , rales or rhonchi. No chest wall deformities or tenderness. Breast: normal by inspection . No dimpling, discharge, masses, tenderness or discharge . CV: PMI is nondisplaced, S1 S2 no gallops, murmurs, rubs. Peripheral pulses are full without delay.No JVD .  ABDOMEN: Bowel sounds normal nontender  No guard or rebound, no hepato splenomegal no CVA tenderness.  Extremtities:  No clubbing cyanosis or edema, no acute joint swelling or redness no focal atrophy NEURO:  Oriented x3, cranial nerves 3-12 appear to be intact, no obvious focal weakness,gait within normal limits no abnormal reflexes or asymmetrical SKIN: No acute rashes normal turgor, color, no bruising or petechiae. PSYCH: Oriented, good eye contact, no obvious depression anxiety, cognition and judgment appear normal. LN: no cervical axillary inguinal adenopathy  Lab Results  Component Value Date   WBC 11.2 (H) 11/16/2019   HGB 13.5 11/16/2019   HCT 40.4 11/16/2019   PLT 300 11/16/2019   GLUCOSE 98 11/16/2019   CHOL 208 (H) 11/16/2019   TRIG 58 11/16/2019   HDL 68 11/16/2019   LDLDIRECT 137.3 07/02/2012   LDLCALC 126 (H) 11/16/2019   ALT 23 11/16/2019   AST 21 11/16/2019   NA 137 11/16/2019   K 4.6 11/16/2019   CL 103 11/16/2019   CREATININE 0.82 11/16/2019   BUN 19 11/16/2019   CO2 27 11/16/2019   TSH 1.53 11/16/2019   HGBA1C 5.8 (H) 11/16/2019   MICROALBUR 0.7 07/12/2015    BP Readings from Last 3 Encounters:  01/03/22 136/80  12/14/21 128/82  11/02/21 130/84    Lab plan reviewed with patient   ASSESSMENT AND PLAN:  Discussed the following  assessment and plan:    ICD-10-CM   1. Visit for preventive health examination  Z00.00     2. Hyperlipidemia, mixed  E78.2 Basic metabolic panel    CBC with Differential/Platelet    Hemoglobin A1c    Hepatic function panel    Lipid panel    Lipid panel    Hepatic function panel    Hemoglobin A1c    CBC with Differential/Platelet    Basic metabolic panel    3. Impaired fasting blood sugar  R73.01 Basic metabolic panel    CBC with Differential/Platelet    Hemoglobin A1c    Hepatic function panel    Lipid panel    Lipid panel    Hepatic function panel    Hemoglobin A1c    CBC with Differential/Platelet    Basic metabolic panel    4. Medication management  Z79.899 Basic metabolic panel    CBC with Differential/Platelet    Hemoglobin A1c    Hepatic function panel    Lipid panel    Lipid panel    Hepatic function panel    Hemoglobin A1c    CBC with Differential/Platelet    Basic metabolic panel    Update labs  HCM rsv flu vaccine  options this fall  Return in about 1 year (around 01/04/2023) for depending on results.  Patient Care Team: Ascher Schroepfer, [name], MD as PCP - General (Internal Medicine) Campbell Stall, MD as Attending Physician (Dermatology) Elliot Cousin, OD [facility]) Patient Instructions  Good to see you today .   Rsv vaccine  October maybe   Lab today  Continue lifestyle intervention healthy eating and exercise .   [name]. Jefte Carithers  M.D.  

## 2022-01-03 NOTE — Patient Instructions (Signed)
Good to see you today .   Rsv vaccine  October maybe   Lab today  Continue lifestyle intervention healthy eating and exercise .

## 2022-01-18 DIAGNOSIS — L814 Other melanin hyperpigmentation: Secondary | ICD-10-CM | POA: Diagnosis not present

## 2022-01-18 DIAGNOSIS — Z86018 Personal history of other benign neoplasm: Secondary | ICD-10-CM | POA: Diagnosis not present

## 2022-01-18 DIAGNOSIS — Z85828 Personal history of other malignant neoplasm of skin: Secondary | ICD-10-CM | POA: Diagnosis not present

## 2022-01-18 DIAGNOSIS — L821 Other seborrheic keratosis: Secondary | ICD-10-CM | POA: Diagnosis not present

## 2022-01-18 DIAGNOSIS — L578 Other skin changes due to chronic exposure to nonionizing radiation: Secondary | ICD-10-CM | POA: Diagnosis not present

## 2022-01-18 DIAGNOSIS — D229 Melanocytic nevi, unspecified: Secondary | ICD-10-CM | POA: Diagnosis not present

## 2022-01-24 NOTE — Progress Notes (Unsigned)
[place] Phone: [phone] Subjective:   [name], am serving as a scribe for Dr. Hulan Saas.  I'm seeing this patient by the request  of:  Panosh, Standley Brooking, MD  CC: Left hip, back pain  [id number]  Reneisha Stilley is a 76 y.o. female coming in with complaint of back and neck pain. OMT 12/14/2021. Patient states that she has pain intermittently in L glute and deep in hip joint. Pain can be sharp. Kicked up into a headstand and this caused sharp pain.   Medications patient has been prescribed: Zanaflex  Taking: As needed         Reviewed prior external information including notes and imaging from previsou exam, outside providers and external EMR if available.   As well as notes that were available from care everywhere and other healthcare systems.  Past medical history, social, surgical and family history all reviewed in electronic medical record.  No pertanent information unless stated regarding to the chief complaint.   Past Medical History:  Diagnosis Date   High triglycerides    Hx of varicella     No Known Allergies   Review of Systems:  No headache, visual changes, nausea, vomiting, diarrhea, constipation, dizziness, abdominal pain, skin rash, fevers, chills, night sweats, weight loss, swollen lymph nodes, body aches, joint swelling, chest pain, shortness of breath, mood changes. POSITIVE muscle aches  Objective  Pulse 73, height 5\' 3"  (1.6 m), weight 124 lb (56.2 kg), SpO2 98 %.   General: No apparent distress alert and oriented x3 mood and affect normal, dressed appropriately.  HEENT: Pupils equal, extraocular movements intact  Respiratory: Patient's speak in full sentences and does not appear short of breath  Cardiovascular: No lower extremity edema, non tender, no erythema  Gait MSK:  Back low back does have some loss of lordosis.  Some tenderness to palpation in the paraspinal  musculature on the left greater than the right.  Patient does have relatively good range of motion of the hip but does have a mild positive FABER test.  Worsening back pain with extension and flexion.  Osteopathic findings  C2 flexed rotated and side bent right C6 flexed rotated and side bent left T3 extended rotated and side bent right inhaled rib T8 extended rotated and side bent left L3 flexed rotated and side bent left L5 rotated and side bent right Sacrum left on left       Assessment and Plan:  Left-sided back pain Chronic problem with exacerbation.  Continues to have the left-sided back pain.  Does have a spondylolisthesis at L4-L5 that is somewhat concerning.  X-rays of the hip did not show any significant arthritic changes.  Did not respond as well to the greater trochanteric injection previously but can consider potentially repeating it again if necessary.  Discussed which activities to do and which ones to avoid.  If worsening pain can consider MRI of the pelvis but think it is highly unlikely.  Zanaflex for breakthrough pain and follow-up with me 6 to 8 weeks for further evaluation    Nonallopathic problems  Decision today to treat with OMT was based on Physical Exam  After verbal consent patient was treated with HVLA, ME, FPR techniques in cervical, rib, thoracic, lumbar, and sacral  areas  Patient tolerated the procedure well with improvement in symptoms  Patient given exercises, stretches and lifestyle modifications  See medications in patient instructions if given  Patient  will follow up in 4-8 weeks    The above documentation has been reviewed and is accurate and complete Lyndal Pulley, DO          Note: This dictation was prepared with Dragon dictation along with smaller phrase technology. Any transcriptional errors that result from this process are unintentional.

## 2022-01-25 ENCOUNTER — Ambulatory Visit (INDEPENDENT_AMBULATORY_CARE_PROVIDER_SITE_OTHER): Payer: Medicare PPO

## 2022-01-25 ENCOUNTER — Ambulatory Visit: Payer: Medicare PPO | Admitting: Family Medicine

## 2022-01-25 VITALS — HR 73 | Ht 63.0 in | Wt 124.0 lb

## 2022-01-25 DIAGNOSIS — M545 Low back pain, unspecified: Secondary | ICD-10-CM

## 2022-01-25 DIAGNOSIS — M25552 Pain in left hip: Secondary | ICD-10-CM

## 2022-01-25 DIAGNOSIS — M9908 Segmental and somatic dysfunction of rib cage: Secondary | ICD-10-CM | POA: Diagnosis not present

## 2022-01-25 DIAGNOSIS — M9901 Segmental and somatic dysfunction of cervical region: Secondary | ICD-10-CM

## 2022-01-25 DIAGNOSIS — M9903 Segmental and somatic dysfunction of lumbar region: Secondary | ICD-10-CM | POA: Diagnosis not present

## 2022-01-25 DIAGNOSIS — M9902 Segmental and somatic dysfunction of thoracic region: Secondary | ICD-10-CM

## 2022-01-25 DIAGNOSIS — M9904 Segmental and somatic dysfunction of sacral region: Secondary | ICD-10-CM

## 2022-01-25 NOTE — Assessment & Plan Note (Signed)
Chronic problem with exacerbation.  Continues to have the left-sided back pain.  Does have a spondylolisthesis at L4-L5 that is somewhat concerning.  X-rays of the hip did not show any significant arthritic changes.  Did not respond as well to the greater trochanteric injection previously but can consider potentially repeating it again if necessary.  Discussed which activities to do and which ones to avoid.  If worsening pain can consider MRI of the pelvis but think it is highly unlikely.  Zanaflex for breakthrough pain and follow-up with me 6 to 8 weeks for further evaluation

## 2022-01-25 NOTE — Patient Instructions (Signed)
LBP exercises Mirilax 17g daily, Colace 100mg  daily for 10 days See me again in 5 weeks

## 2022-02-14 DIAGNOSIS — H401131 Primary open-angle glaucoma, bilateral, mild stage: Secondary | ICD-10-CM | POA: Diagnosis not present

## 2022-02-27 NOTE — Progress Notes (Unsigned)
[facility] [place] Phone: [phone] Subjective:   Rito Ehrlich, am serving as a scribe for Dr. Hulan Saas.  I'm seeing this patient by the request  of:  Panosh, Standley Brooking, MD  CC: Neck and toe pain  [id number]  Tina [name] is a 76 y.o. female coming in with complaint of back and neck pain. OMT 01/25/2022. Patient states left hip is better but still bothering her. Patient states she has started having left great toe pain that comes and goes. No MOI been bothering her for about a week or two. Patient said it feels sharp in nature like the arthritis she has in her thumb.   Medications patient has been prescribed: None  Taking:         Reviewed prior external information including notes and imaging from previsou exam, outside providers and external EMR if available.   As well as notes that were available from care everywhere and other healthcare systems.  Past medical history, social, surgical and family history all reviewed in electronic medical record.  No pertanent information unless stated regarding to the chief complaint.   Past Medical History:  Diagnosis Date   High triglycerides    Hx of varicella     No Known Allergies   Review of Systems:  No headache, visual changes, nausea, vomiting, diarrhea, constipation, dizziness, abdominal pain, skin rash, fevers, chills, night sweats, weight loss, swollen lymph nodes, body aches, joint swelling, chest pain, shortness of breath, mood changes. POSITIVE muscle aches  Objective  Blood pressure 100/60, pulse 70, height 5\' 3"  (1.6 m), weight 124 lb (56.2 kg), SpO2 94 %.   General: No apparent distress alert and oriented x3 mood and affect normal, dressed appropriately.  HEENT: Pupils equal, extraocular movements intact  Respiratory: Patient's speak in full sentences and does not appear short of breath  Cardiovascular: No lower extremity edema, non tender,  no erythema  Neck exam does have some loss of lordosis.  Some tenderness to palpation.  Mild crepitus.  Limited sidebending. Foot exam shows patient does have a hallux limitus with bunion and bunionette formation on the left foot noted.  Patient does have breakdown of the transverse arch noted.  Osteopathic findings  C2 flexed rotated and side bent right C6 flexed rotated and side bent left T3 extended rotated and side bent left inhaled rib T9 extended rotated and side bent left L2 flexed rotated and side bent right Sacrum right on right       Assessment and Plan:  Neck pain on left side Continued neck pain at this time.  Discussed icing regimen and home exercises, discussed which activities to do which ones to avoid.  Increase activity slowly over the course of the next several weeks.  Discussed icing regimen.  Follow-up again in 6 to 8 weeks.    Nonallopathic problems  Decision today to treat with OMT was based on Physical Exam  After verbal consent patient was treated with HVLA, ME, FPR techniques in cervical, rib, thoracic, lumbar, and sacral  areas  Patient tolerated the procedure well with improvement in symptoms  Patient given exercises, stretches and lifestyle modifications  See medications in patient instructions if given  Patient will follow up in 4-8 weeks    The above documentation has been reviewed and is accurate and complete Lyndal Pulley, DO          Note: This dictation was prepared with Dragon dictation along with  smaller phrase technology. Any transcriptional errors that result from this process are unintentional.

## 2022-03-01 ENCOUNTER — Ambulatory Visit: Payer: Medicare PPO | Admitting: Family Medicine

## 2022-03-01 ENCOUNTER — Encounter: Payer: Self-pay | Admitting: Internal Medicine

## 2022-03-01 VITALS — BP 100/60 | HR 70 | Ht 63.0 in | Wt 124.0 lb

## 2022-03-01 DIAGNOSIS — M9904 Segmental and somatic dysfunction of sacral region: Secondary | ICD-10-CM

## 2022-03-01 DIAGNOSIS — M9903 Segmental and somatic dysfunction of lumbar region: Secondary | ICD-10-CM | POA: Diagnosis not present

## 2022-03-01 DIAGNOSIS — M542 Cervicalgia: Secondary | ICD-10-CM | POA: Diagnosis not present

## 2022-03-01 DIAGNOSIS — M19072 Primary osteoarthritis, left ankle and foot: Secondary | ICD-10-CM

## 2022-03-01 DIAGNOSIS — M9902 Segmental and somatic dysfunction of thoracic region: Secondary | ICD-10-CM | POA: Diagnosis not present

## 2022-03-01 DIAGNOSIS — M9901 Segmental and somatic dysfunction of cervical region: Secondary | ICD-10-CM | POA: Diagnosis not present

## 2022-03-01 DIAGNOSIS — M9908 Segmental and somatic dysfunction of rib cage: Secondary | ICD-10-CM | POA: Diagnosis not present

## 2022-03-01 NOTE — Assessment & Plan Note (Signed)
Continued neck pain at this time.  Discussed icing regimen and home exercises, discussed which activities to do which ones to avoid.  Increase activity slowly over the course of the next several weeks.  Discussed icing regimen.  Follow-up again in 6 to 8 weeks.

## 2022-03-01 NOTE — Progress Notes (Signed)
[facility] 

## 2022-03-01 NOTE — Patient Instructions (Addendum)
Good to see you You have to go shoe shopping Just watch loading the toe too much Avoid excessive extension of the back with trainer Follow up in 6-8 weeks

## 2022-03-01 NOTE — Assessment & Plan Note (Signed)
New problem, good prognosis, discussed icing regimen and home exercises.  Discussed right series.  Discussed avoiding certain activities.  Follow-up 8 weeks

## 2022-04-09 NOTE — Progress Notes (Unsigned)
  [name] [address] [place] [address] Phone: [phone] Subjective:   Tina [name], am serving as a scribe for Dr. Antoine Primas.  I'm seeing this patient by the request  of:  Panosh, Tina Mends, MD  CC: Back and neck pain follow-up  [id number]  Tina [name] is a 76 y.o. female coming in with complaint of back and neck pain. OMT 03/01/2022. Also f/u for 1st MTP jt pain for L foot. Patient states that she is having some nerve pain in L glute and noticed that the base of the 1st toe when she has pain in the glute.   Medications patient has been prescribed: none          Reviewed prior external information including notes and imaging from previsou exam, outside providers and external EMR if available.   As well as notes that were available from care everywhere and other healthcare systems.  Past medical history, social, surgical and family history all reviewed in electronic medical record.  No pertanent information unless stated regarding to the chief complaint.   Past Medical History:  Diagnosis Date   High triglycerides    Hx of varicella     No Known Allergies   Review of Systems:  No headache, visual changes, nausea, vomiting, diarrhea, constipation, dizziness, abdominal pain, skin rash, fevers, chills, night sweats, weight loss, swollen lymph nodes, body aches, joint swelling, chest pain, shortness of breath, mood changes. POSITIVE muscle aches  Objective  Blood pressure 124/82, pulse 76, height 5\' 3"  (1.6 m), weight 127 lb (57.6 kg), SpO2 99 %.   General: No apparent distress alert and oriented x3 mood and affect normal, dressed appropriately.  HEENT: Pupils equal, extraocular movements intact  Respiratory: Patient's speak in full sentences and does not appear short of breath  Left foot exam shows that patient does have some hallux limitus noted of the first toe. Neck exam does have some loss of lordosis.  Tenderness to  palpation in the paraspinal musculature.  Osteopathic findings  C3 flexed rotated and side bent left  C6 flexed rotated and side bent left T3 extended rotated and side bent left inhaled rib T9 extended rotated and side bent left L2 flexed rotated and side bent right Sacrum right on right       Assessment and Plan:  Arthritis of first metatarsophalangeal (MTP) joint of left foot Patient given injection and tolerated the procedure well.  Discussed icing regimen and home exercises, which activities to do and which ones to avoid.  Increase activity slowly.  Follow-up again in 6 to 8 weeks.    Nonallopathic problems  Decision today to treat with OMT was based on Physical Exam  After verbal consent patient was treated with HVLA, ME, FPR techniques in cervical, rib, thoracic, lumbar, and sacral  areas  Patient tolerated the procedure well with improvement in symptoms  Patient given exercises, stretches and lifestyle modifications  See medications in patient instructions if given  Patient will follow up in 4-8 weeks    The above documentation has been reviewed and is accurate and complete , DO          Note: This dictation was prepared with Dragon dictation along with smaller phrase technology. Any transcriptional errors that result from this process are unintentional.

## 2022-04-12 ENCOUNTER — Ambulatory Visit: Payer: Medicare PPO | Admitting: Family Medicine

## 2022-04-12 ENCOUNTER — Encounter: Payer: Self-pay | Admitting: Family Medicine

## 2022-04-12 ENCOUNTER — Ambulatory Visit: Payer: Self-pay

## 2022-04-12 VITALS — BP 124/82 | HR 76 | Ht 63.0 in | Wt 127.0 lb

## 2022-04-12 DIAGNOSIS — M9904 Segmental and somatic dysfunction of sacral region: Secondary | ICD-10-CM

## 2022-04-12 DIAGNOSIS — M79672 Pain in left foot: Secondary | ICD-10-CM

## 2022-04-12 DIAGNOSIS — M9902 Segmental and somatic dysfunction of thoracic region: Secondary | ICD-10-CM

## 2022-04-12 DIAGNOSIS — M9908 Segmental and somatic dysfunction of rib cage: Secondary | ICD-10-CM

## 2022-04-12 DIAGNOSIS — M9901 Segmental and somatic dysfunction of cervical region: Secondary | ICD-10-CM

## 2022-04-12 DIAGNOSIS — M9903 Segmental and somatic dysfunction of lumbar region: Secondary | ICD-10-CM

## 2022-04-12 DIAGNOSIS — M19072 Primary osteoarthritis, left ankle and foot: Secondary | ICD-10-CM

## 2022-04-12 NOTE — Assessment & Plan Note (Addendum)
Patient given injection and tolerated the procedure well.  Discussed icing regimen and home exercises, which activities to do and which ones to avoid.  Increase activity slowly.  Discussed rigid soled shoes follow-up again in 6 to 8 weeks.

## 2022-04-12 NOTE — Patient Instructions (Addendum)
Good to see you  Injection in big toe today Wear good shoes If worsening should consider MRI of the back  Follow up in 6-8 weeks

## 2022-05-23 NOTE — Progress Notes (Signed)
[place] Phone: [phone] Subjective:   [name], am serving as a scribe for Dr. Hulan Saas.  I'm seeing this patient by the request  of:  Panosh, Standley Brooking, MD  CC: Neck and back pain follow-up  [id number]  04/12/2022 Patient given injection and tolerated the procedure well.  Discussed icing regimen and home exercises, which activities to do and which ones to avoid.  Increase activity slowly.  Discussed rigid soled shoes follow-up again in 6 to 8 weeks.   Update 05/29/2022 Kenzlei Runions is a 77 y.o. female coming in with complaint of back and neck pain. OTM 04/12/2022. Also f/u for L foot pain. Patient states that she continues to have pain off and on in her foot. Overall she is doing well. No flares in back and neck.   Medications patient has been prescribed:   Taking:         Reviewed prior external information including notes and imaging from previsou exam, outside providers and external EMR if available.   As well as notes that were available from care everywhere and other healthcare systems.  Past medical history, social, surgical and family history all reviewed in electronic medical record.  No pertanent information unless stated regarding to the chief complaint.   Past Medical History:  Diagnosis Date   High triglycerides    Hx of varicella     No Known Allergies   Review of Systems:  No headache, visual changes, nausea, vomiting, diarrhea, constipation, dizziness, abdominal pain, skin rash, fevers, chills, night sweats, weight loss, swollen lymph nodes, body aches, joint swelling, chest pain, shortness of breath, mood changes. POSITIVE muscle aches  Objective  Blood pressure 128/82, pulse 77, height 5\' 3"  (1.6 m), weight 126 lb (57.2 kg), SpO2 99 %.   General: No apparent distress alert and oriented x3 mood and affect normal, dressed appropriately.  HEENT: Pupils equal,  extraocular movements intact  Respiratory: Patient's speak in full sentences and does not appear short of breath  Cardiovascular: No lower extremity edema, non tender, no erythema  Neck exam still has tightness noted more on the left than the right.  The patient does have tightness in the left parascapular area.  Patient also has tenderness over the left sacroiliac joint.  Positive Faber on the left.  Osteopathic findings  C2 flexed rotated and side bent left C6 flexed rotated and side bent left T3 extended rotated and side bent left inhaled rib T9 extended rotated and side bent left L2 flexed rotated and side bent right Sacrum left on left       Assessment and Plan:  Left-sided back pain Continues to have the left-sided back pain.  Discussed icing regimen and home exercises, discussed which activities to do and which ones to avoid.  Increase activity slowly.  Discussed home exercises and ergonomics.  Patient does state that she has more pain RTC in 6-8 weeks     Nonallopathic problems  Decision today to treat with OMT was based on Physical Exam  After verbal consent patient was treated with HVLA, ME, FPR techniques in cervical, rib, thoracic, lumbar, and sacral  areas  Patient tolerated the procedure well with improvement in symptoms  Patient given exercises, stretches and lifestyle modifications  See medications in patient instructions if given  Patient will follow up in 4-8 weeks      The above documentation has been reviewed and is accurate and complete Olevia Bowens  Tamala Julian, DO        Note: This dictation was prepared with Dragon dictation along with smaller phrase technology. Any transcriptional errors that result from this process are unintentional.

## 2022-05-29 ENCOUNTER — Ambulatory Visit: Payer: Medicare PPO | Admitting: Family Medicine

## 2022-05-29 VITALS — BP 128/82 | HR 77 | Ht 63.0 in | Wt 126.0 lb

## 2022-05-29 DIAGNOSIS — M9908 Segmental and somatic dysfunction of rib cage: Secondary | ICD-10-CM | POA: Diagnosis not present

## 2022-05-29 DIAGNOSIS — M9903 Segmental and somatic dysfunction of lumbar region: Secondary | ICD-10-CM | POA: Diagnosis not present

## 2022-05-29 DIAGNOSIS — M9904 Segmental and somatic dysfunction of sacral region: Secondary | ICD-10-CM

## 2022-05-29 DIAGNOSIS — M9902 Segmental and somatic dysfunction of thoracic region: Secondary | ICD-10-CM | POA: Diagnosis not present

## 2022-05-29 DIAGNOSIS — M545 Low back pain, unspecified: Secondary | ICD-10-CM | POA: Diagnosis not present

## 2022-05-29 DIAGNOSIS — M9901 Segmental and somatic dysfunction of cervical region: Secondary | ICD-10-CM

## 2022-05-29 NOTE — Assessment & Plan Note (Signed)
Continues to have the left-sided back pain.  Discussed icing regimen and home exercises, discussed which activities to do and which ones to avoid.  Increase activity slowly.  Discussed home exercises and ergonomics.  Patient does state that she has more pain RTC in 6-8 weeks

## 2022-05-29 NOTE — Patient Instructions (Signed)
Good to see you Thanks for showing the lights See me in 6 weeks

## 2022-05-31 DIAGNOSIS — H401131 Primary open-angle glaucoma, bilateral, mild stage: Secondary | ICD-10-CM | POA: Diagnosis not present

## 2022-07-05 DIAGNOSIS — M25559 Pain in unspecified hip: Secondary | ICD-10-CM | POA: Diagnosis not present

## 2022-07-05 DIAGNOSIS — M542 Cervicalgia: Secondary | ICD-10-CM | POA: Diagnosis not present

## 2022-07-10 DIAGNOSIS — M542 Cervicalgia: Secondary | ICD-10-CM | POA: Diagnosis not present

## 2022-07-10 DIAGNOSIS — M25559 Pain in unspecified hip: Secondary | ICD-10-CM | POA: Diagnosis not present

## 2022-07-11 NOTE — Progress Notes (Unsigned)
[place] Phone: [phone] Subjective:   [name], am serving as a scribe for Dr. Hulan Saas.  I'm seeing this patient by the request  of:  Panosh, Standley Brooking, MD  CC: Back and neck pain follow-up  [id number]  Tina [name] is a 77 y.o. female coming in with complaint of back and neck pain. OMT 05/29/2022. Patient states that her neck was bothering but has calmed down. Intermittent hip pain as well in the mornings. Pain over L SI and piriformis. Has been doing PT for tightness in her L hip. Tried tennis ball in back pocket and this almost made her jump out of her seat. Patient does at times have radiating symptoms into the L leg all the way to the toes. Doing ok today.   Medications patient has been prescribed: None  Taking:         Reviewed prior external information including notes and imaging from previsou exam, outside providers and external EMR if available.   As well as notes that were available from care everywhere and other healthcare systems.  Past medical history, social, surgical and family history all reviewed in electronic medical record.  No pertanent information unless stated regarding to the chief complaint.   Past Medical History:  Diagnosis Date   High triglycerides    Hx of varicella     No Known Allergies   Review of Systems:  No headache, visual changes, nausea, vomiting, diarrhea, constipation, dizziness, abdominal pain, skin rash, fevers, chills, night sweats, weight loss, swollen lymph nodes, body aches, joint swelling, chest pain, shortness of breath, mood changes. POSITIVE muscle aches  Objective  Blood pressure 124/80, pulse 71, height 5\' 3"  (1.6 m), weight 126 lb (57.2 kg), SpO2 99 %.   General: No apparent distress alert and oriented x3 mood and affect normal, dressed appropriately.  HEENT: Pupils equal, extraocular movements intact  Respiratory: Patient's speak  in full sentences and does not appear short of breath  Cardiovascular: No lower extremity edema, non tender, no erythema  Neck exam on the left side does have some tightness noted.  Left paraspinal musculature of the lumbar spine does have pain as well.  Pelvic shear noted on the left side.  Tender to palpation over the greater trochanteric area.  Osteopathic findings  C6 flexed rotated and side bent left T3 extended rotated and side bent right inhaled rib T9 extended rotated and side bent left L3 flexed rotated and side bent left  Sacrum left on left        Assessment and Plan:  Greater trochanteric bursitis of left hip Believe patient has mild exacerbation discussed with patient about icing regimen and home exercises.  Discussed proper foot positioning with certain workouts.  Increase activity slowly over the course the next several weeks.  We discussed with patient to avoid certain instability workouts as well.  Follow-up again 6 to 8 weeks    Nonallopathic problems  Decision today to treat with OMT was based on Physical Exam  After verbal consent patient was treated with HVLA, ME, FPR techniques in cervical, rib, thoracic, lumbar, and sacral  areas  Patient tolerated the procedure well with improvement in symptoms  Patient given exercises, stretches and lifestyle modifications  See medications in patient instructions if given  Patient will follow up in 4-8 weeks    The above documentation has been reviewed and is accurate and complete Lyndal Pulley, DO  Note: This dictation was prepared with Dragon dictation along with smaller phrase technology. Any transcriptional errors that result from this process are unintentional.

## 2022-07-12 ENCOUNTER — Encounter: Payer: Self-pay | Admitting: Family Medicine

## 2022-07-12 ENCOUNTER — Ambulatory Visit: Payer: Medicare PPO | Admitting: Family Medicine

## 2022-07-12 VITALS — BP 124/80 | HR 71 | Ht 63.0 in | Wt 126.0 lb

## 2022-07-12 DIAGNOSIS — M9903 Segmental and somatic dysfunction of lumbar region: Secondary | ICD-10-CM

## 2022-07-12 DIAGNOSIS — M7062 Trochanteric bursitis, left hip: Secondary | ICD-10-CM | POA: Diagnosis not present

## 2022-07-12 DIAGNOSIS — M9904 Segmental and somatic dysfunction of sacral region: Secondary | ICD-10-CM | POA: Diagnosis not present

## 2022-07-12 DIAGNOSIS — M9908 Segmental and somatic dysfunction of rib cage: Secondary | ICD-10-CM

## 2022-07-12 DIAGNOSIS — M9902 Segmental and somatic dysfunction of thoracic region: Secondary | ICD-10-CM

## 2022-07-12 DIAGNOSIS — M9901 Segmental and somatic dysfunction of cervical region: Secondary | ICD-10-CM

## 2022-07-12 NOTE — Patient Instructions (Signed)
Keep toes faced forward with squats Remember midline body when doing lunges See me again in 6-8 weeks

## 2022-07-12 NOTE — Assessment & Plan Note (Signed)
Believe patient has mild exacerbation discussed with patient about icing regimen and home exercises.  Discussed proper foot positioning with certain workouts.  Increase activity slowly over the course the next several weeks.  We discussed with patient to avoid certain instability workouts as well.  Follow-up again 6 to 8 weeks

## 2022-07-17 DIAGNOSIS — M542 Cervicalgia: Secondary | ICD-10-CM | POA: Diagnosis not present

## 2022-07-17 DIAGNOSIS — M25559 Pain in unspecified hip: Secondary | ICD-10-CM | POA: Diagnosis not present

## 2022-07-19 DIAGNOSIS — M542 Cervicalgia: Secondary | ICD-10-CM | POA: Diagnosis not present

## 2022-07-19 DIAGNOSIS — M25559 Pain in unspecified hip: Secondary | ICD-10-CM | POA: Diagnosis not present

## 2022-07-24 DIAGNOSIS — M25559 Pain in unspecified hip: Secondary | ICD-10-CM | POA: Diagnosis not present

## 2022-07-24 DIAGNOSIS — M542 Cervicalgia: Secondary | ICD-10-CM | POA: Diagnosis not present

## 2022-07-26 DIAGNOSIS — M25559 Pain in unspecified hip: Secondary | ICD-10-CM | POA: Diagnosis not present

## 2022-07-26 DIAGNOSIS — M542 Cervicalgia: Secondary | ICD-10-CM | POA: Diagnosis not present

## 2022-08-02 DIAGNOSIS — M542 Cervicalgia: Secondary | ICD-10-CM | POA: Diagnosis not present

## 2022-08-02 DIAGNOSIS — M25559 Pain in unspecified hip: Secondary | ICD-10-CM | POA: Diagnosis not present

## 2022-08-06 DIAGNOSIS — M25559 Pain in unspecified hip: Secondary | ICD-10-CM | POA: Diagnosis not present

## 2022-08-06 DIAGNOSIS — M542 Cervicalgia: Secondary | ICD-10-CM | POA: Diagnosis not present

## 2022-08-11 DIAGNOSIS — M542 Cervicalgia: Secondary | ICD-10-CM | POA: Diagnosis not present

## 2022-08-11 DIAGNOSIS — M25559 Pain in unspecified hip: Secondary | ICD-10-CM | POA: Diagnosis not present

## 2022-08-16 DIAGNOSIS — M25559 Pain in unspecified hip: Secondary | ICD-10-CM | POA: Diagnosis not present

## 2022-08-16 DIAGNOSIS — M542 Cervicalgia: Secondary | ICD-10-CM | POA: Diagnosis not present

## 2022-08-18 IMAGING — DX DG LUMBAR SPINE 2-3V
3 series · 3 of 3 positions shown · non-contrast
Comparison: None Available.

CLINICAL DATA: Low back pain.

EXAM:
LUMBAR SPINE - 2-3 VIEW

[l-spine ap]
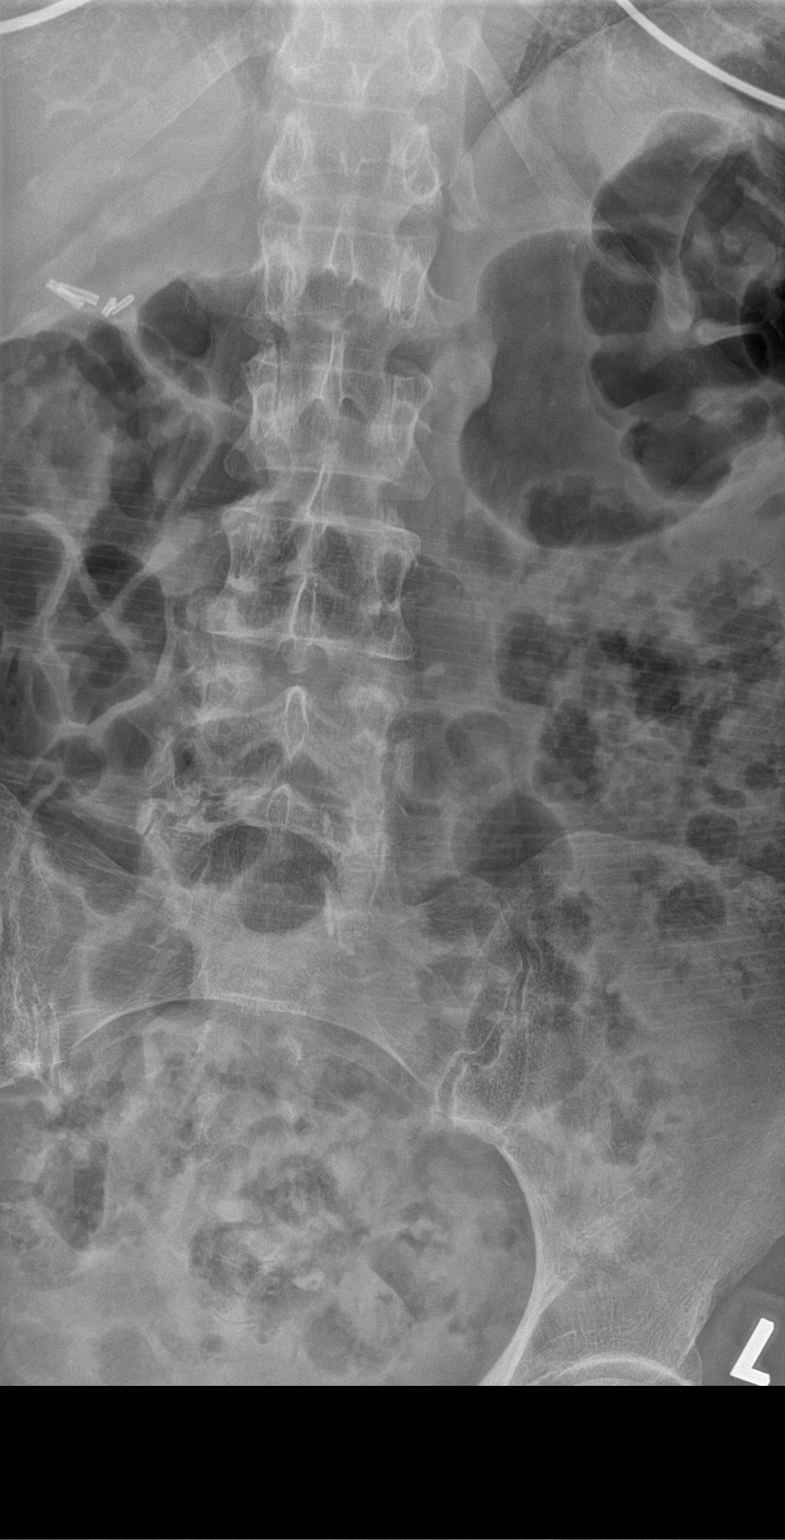

[l-spine lateral]
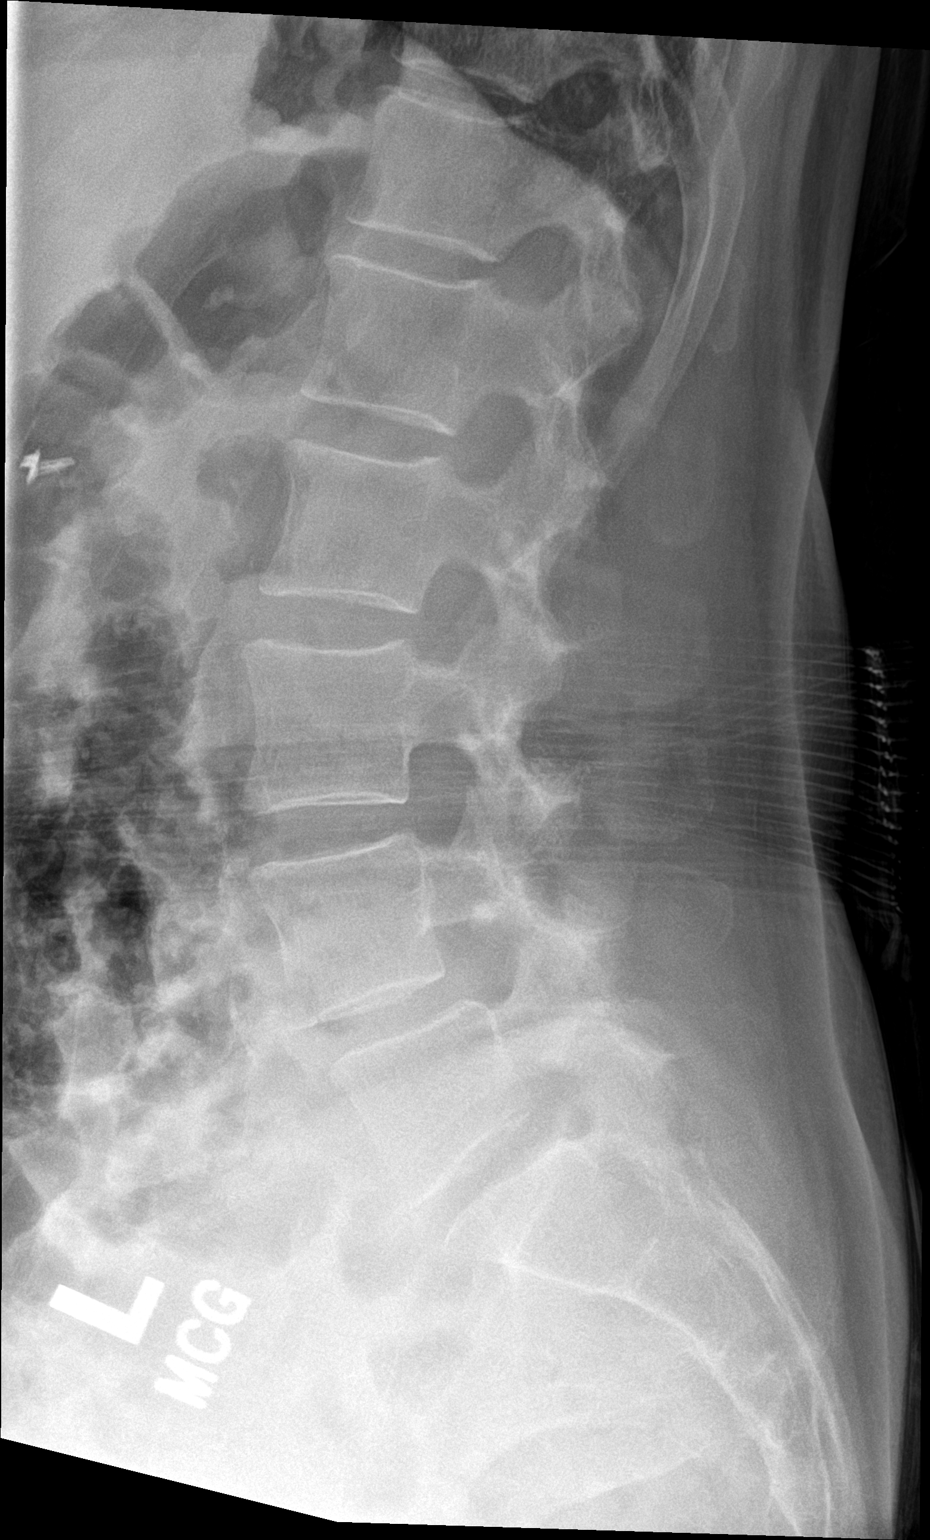

[l-spine spot]
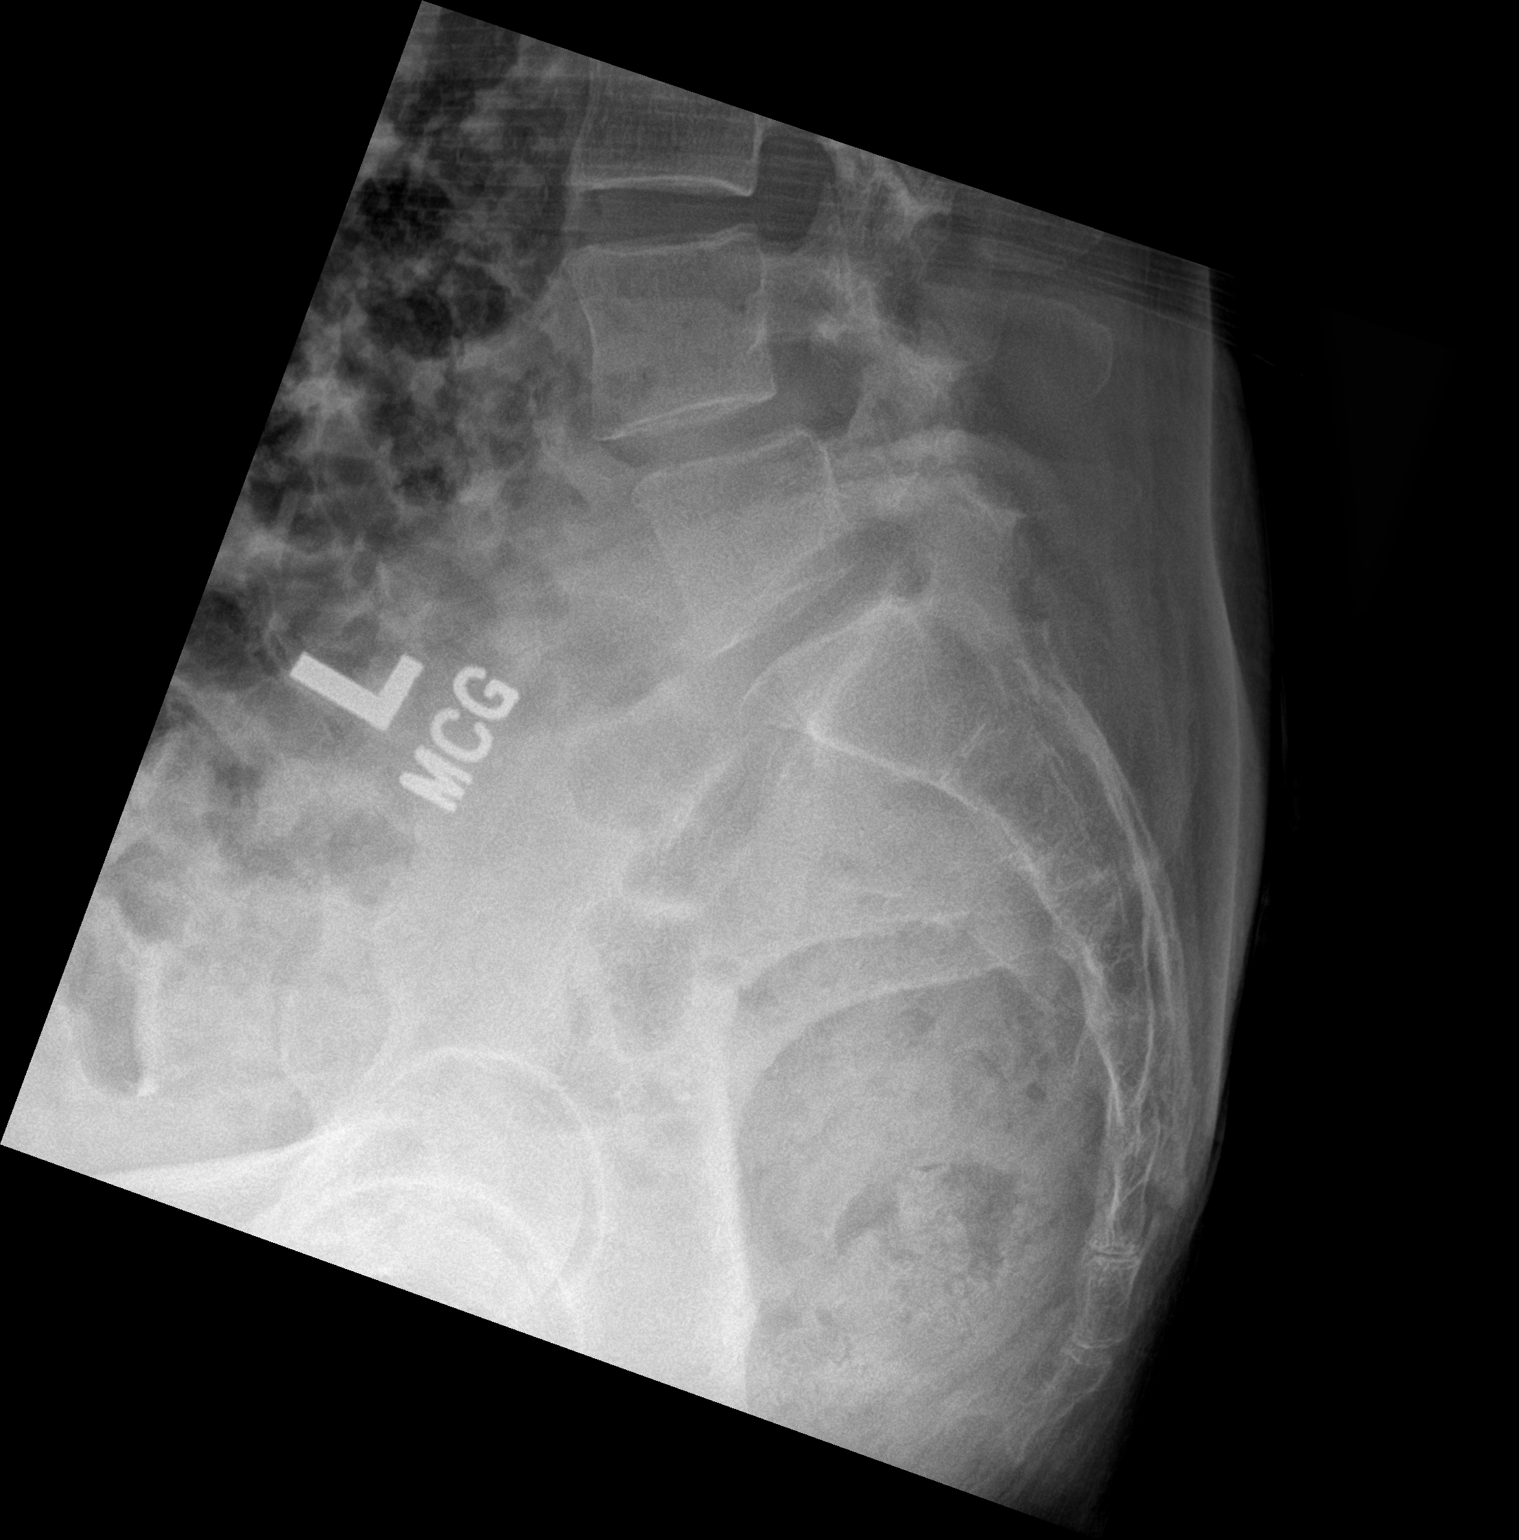

[3 of 3 positions shown; findings below may reference images not displayed]

FINDINGS: There is 3 mm of anterolisthesis at L4-L5. Alignment is otherwise
anatomic. Disc spaces are maintained. No evidence for fracture or
focal osseous lesion. Soft tissues are within normal limits.
Cholecystectomy clips are present.
IMPRESSION: 1. Grade 1 anterolisthesis at L4-L5.
2. No evidence for fracture.

## 2022-08-20 DIAGNOSIS — M25559 Pain in unspecified hip: Secondary | ICD-10-CM | POA: Diagnosis not present

## 2022-08-20 DIAGNOSIS — M542 Cervicalgia: Secondary | ICD-10-CM | POA: Diagnosis not present

## 2022-08-29 NOTE — Progress Notes (Unsigned)
[name] [address] [place] [address] Phone: [phone] Subjective:   Tina [name], am serving as a scribe for Dr. Antoine Primas.  I'm seeing this patient by the request  of:  Panosh, [name], MD  CC: Back and neck pain follow-up  [id number]  Tina [name] is a 77 y.o. female coming in with complaint of back and neck pain. OMT 07/12/2022. Patient states hasn't heard about the from the custom orthotics doctor yet. No other concerns.  Patient recently did have an encounter where a dog did not occur over.  Has some bruising on the right side of her body.  Nothing about stopping it.  The patient does have a left-sided gluteal pain still.  Still seems to be associated with the sacroiliac joint.  Medications patient has been prescribed: None          Reviewed prior external information including notes and imaging from previsou exam, outside providers and external EMR if available.   As well as notes that were available from care everywhere and other healthcare systems.  Past medical history, social, surgical and family history all reviewed in electronic medical record.  No pertanent information unless stated regarding to the chief complaint.   Past Medical History:  Diagnosis Date   High triglycerides    Hx of varicella     No Known Allergies   Review of Systems:  No headache, visual changes, nausea, vomiting, diarrhea, constipation, dizziness, abdominal pain, skin rash, fevers, chills, night sweats, weight loss, swollen lymph nodes, body aches, joint swelling, chest pain, shortness of breath, mood changes. POSITIVE muscle aches  Objective  Blood pressure 130/84, pulse 79, height 5\' 3"  (1.6 m), weight 125 lb (56.7 kg), SpO2 98 %.   General: No apparent distress alert and oriented x3 mood and affect normal, dressed appropriately.  HEENT: Pupils equal, extraocular movements intact  Respiratory: Patient's speak in full sentences and  does not appear short of breath  Cardiovascular: No lower extremity edema, non tender, no erythema  Low back exam does have some loss lordosis noted.  Patient does have tenderness to palpation over the left sacroiliac joint.  Left side of the neck does have some tenderness to palpation as well.  Some limited sidebending and rotation noted.  Osteopathic findings  C2 flexed rotated and side bent left C6 flexed rotated and side bent left T3 extended rotated and side bent right inhaled rib L2 flexed rotated and side bent right Sacrum left on left       Assessment and Plan:  Left-sided back pain Continues to have the left-sided back pain.  Discussed icing regimen and home exercises, discussed which activities to do and which ones to avoid.  Increase activity slowly over the course of next several weeks.  Follow-up with me again in 6 to 8 weeks    Nonallopathic problems  Decision today to treat with OMT was based on Physical Exam  After verbal consent patient was treated with HVLA, ME, FPR techniques in cervical, rib, thoracic, lumbar, and sacral  areas  Patient tolerated the procedure well with improvement in symptoms  Patient given exercises, stretches and lifestyle modifications  See medications in patient instructions if given  Patient will follow up in 6-8 weeks     The above documentation has been reviewed and is accurate and complete Tina Saa, DO         Note: This dictation was prepared with Dragon dictation along with smaller phrase  technology. Any transcriptional errors that result from this process are unintentional.

## 2022-08-30 ENCOUNTER — Ambulatory Visit: Payer: Medicare PPO | Admitting: Family Medicine

## 2022-08-30 VITALS — BP 130/84 | HR 79 | Ht 63.0 in | Wt 125.0 lb

## 2022-08-30 DIAGNOSIS — M545 Low back pain, unspecified: Secondary | ICD-10-CM

## 2022-08-30 DIAGNOSIS — M9902 Segmental and somatic dysfunction of thoracic region: Secondary | ICD-10-CM | POA: Diagnosis not present

## 2022-08-30 DIAGNOSIS — M9903 Segmental and somatic dysfunction of lumbar region: Secondary | ICD-10-CM | POA: Diagnosis not present

## 2022-08-30 DIAGNOSIS — M19072 Primary osteoarthritis, left ankle and foot: Secondary | ICD-10-CM | POA: Diagnosis not present

## 2022-08-30 DIAGNOSIS — M542 Cervicalgia: Secondary | ICD-10-CM | POA: Diagnosis not present

## 2022-08-30 DIAGNOSIS — M9908 Segmental and somatic dysfunction of rib cage: Secondary | ICD-10-CM

## 2022-08-30 DIAGNOSIS — M9904 Segmental and somatic dysfunction of sacral region: Secondary | ICD-10-CM

## 2022-08-30 DIAGNOSIS — M9901 Segmental and somatic dysfunction of cervical region: Secondary | ICD-10-CM

## 2022-08-30 DIAGNOSIS — M79672 Pain in left foot: Secondary | ICD-10-CM | POA: Diagnosis not present

## 2022-08-30 DIAGNOSIS — M25559 Pain in unspecified hip: Secondary | ICD-10-CM | POA: Diagnosis not present

## 2022-08-30 NOTE — Assessment & Plan Note (Signed)
Mild antalgic gait and will send patient for custom orthotics with her being greater than 77 years old.

## 2022-08-30 NOTE — Assessment & Plan Note (Signed)
Continues to have the left-sided back pain.  Discussed icing regimen and home exercises, discussed which activities to do and which ones to avoid.  Increase activity slowly over the course of next several weeks.  Follow-up with me again in 6 to 8 weeks

## 2022-08-30 NOTE — Patient Instructions (Signed)
Hopefully they contact you about orthotics Careful with full contact dog walking See you again in 6-8 weeks

## 2022-09-05 ENCOUNTER — Ambulatory Visit: Payer: Medicare PPO | Admitting: Family Medicine

## 2022-09-05 VITALS — BP 136/74 | Ht 63.5 in | Wt 122.0 lb

## 2022-09-05 DIAGNOSIS — M79671 Pain in right foot: Secondary | ICD-10-CM | POA: Diagnosis not present

## 2022-09-05 DIAGNOSIS — Q667 Congenital pes cavus, unspecified foot: Secondary | ICD-10-CM | POA: Diagnosis not present

## 2022-09-05 DIAGNOSIS — M79672 Pain in left foot: Secondary | ICD-10-CM

## 2022-09-05 NOTE — Progress Notes (Signed)
PCP: Madelin Headings, MD  Subjective:   HPI: Patient is a 77 y.o. female here for orthotics.  Patient here for custom orthotics. 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 Not having pain currently in feet though has numbness in left 3rd and 4th digits. History remotely of right fifth metatarsal fracture.  Past Medical History:  Diagnosis Date   High triglycerides    Hx of varicella     Current Outpatient Medications on File Prior to Visit  Medication Sig Dispense Refill   CALCIUM CITRATE PO Take by mouth.     cetirizine (ZYRTEC) 10 MG tablet Take 10 mg by mouth daily.     cholecalciferol (VITAMIN D) 1000 units tablet Take 1,000 Units by mouth daily.     famotidine (PEPCID) 20 MG tablet Take 20 mg by mouth 2 (two) times daily.     Latanoprost 0.005 % EMUL Place into both eyes at bedtime.     MULTIPLE VITAMIN PO Take by mouth.     Omega-3 Fatty Acids (OMEGA 3 PO) Take by mouth.     tiZANidine (ZANAFLEX) 4 MG tablet Take 1 tablet (4 mg total) by mouth at bedtime. (Patient taking differently: Take 4 mg by mouth. PRN) 90 tablet 1   TURMERIC PO Take by mouth.     No current facility-administered medications on file prior to visit.    Past Surgical History:  Procedure Laterality Date   CHOLECYSTECTOMY      No Known Allergies  BP 136/74   Ht 5' 3.5" (1.613 m)   Wt 122 lb (55.3 kg)   BMI 21.27 kg/m       No data to display              No data to display              Objective:  Physical Exam:  Gen: NAD, comfortable in exam room  Bilateral feet/ankles: Pes cavus.  Mild transverse arch collapse; bunionettes bilaterally.  No hallux valgus or rigidus. FROM ankle. No TTP currently. Thompsons test negative. NV intact distally. Leg lengths equal.   Assessment & Plan:  1. Bilateral foot pain - metatarsalgia with numbness on left side 3rd and 4th digits.  Custom orthotics made today as below.  Small metatarsal pads added.   Discussed how to break these in, what to prompt reevaluation for.  F/u prn.  Patient was fitted for a : standard, cushioned, semi-rigid orthotic. The orthotic was heated and afterward the patient stood on the orthotic blank positioned on the orthotic stand. The patient was positioned in subtalar neutral position and 10 degrees of ankle dorsiflexion in a weight bearing stance. After completion of molding, a stable base was applied to the orthotic blank. The blank was ground to a stable position for weight bearing. Size: 9 fit & run Base: none Posting: none Additional orthotic padding: small metatarsal pads bilaterally

## 2022-09-06 DIAGNOSIS — M542 Cervicalgia: Secondary | ICD-10-CM | POA: Diagnosis not present

## 2022-09-06 DIAGNOSIS — M25559 Pain in unspecified hip: Secondary | ICD-10-CM | POA: Diagnosis not present

## 2022-09-13 DIAGNOSIS — M542 Cervicalgia: Secondary | ICD-10-CM | POA: Diagnosis not present

## 2022-09-13 DIAGNOSIS — H401131 Primary open-angle glaucoma, bilateral, mild stage: Secondary | ICD-10-CM | POA: Diagnosis not present

## 2022-09-13 DIAGNOSIS — M25559 Pain in unspecified hip: Secondary | ICD-10-CM | POA: Diagnosis not present

## 2022-09-27 DIAGNOSIS — M542 Cervicalgia: Secondary | ICD-10-CM | POA: Diagnosis not present

## 2022-09-27 DIAGNOSIS — M25559 Pain in unspecified hip: Secondary | ICD-10-CM | POA: Diagnosis not present

## 2022-10-09 DIAGNOSIS — M25559 Pain in unspecified hip: Secondary | ICD-10-CM | POA: Diagnosis not present

## 2022-10-09 DIAGNOSIS — M542 Cervicalgia: Secondary | ICD-10-CM | POA: Diagnosis not present

## 2022-10-16 DIAGNOSIS — M542 Cervicalgia: Secondary | ICD-10-CM | POA: Diagnosis not present

## 2022-10-16 DIAGNOSIS — M25559 Pain in unspecified hip: Secondary | ICD-10-CM | POA: Diagnosis not present

## 2022-10-17 NOTE — Progress Notes (Unsigned)
  [name] [address] [place] [address] Phone: [phone] Subjective:   [name], am serving as a scribe for Dr. Antoine Primas.  I'm seeing this patient by the request  of:  Panosh, [name], MD  CC: Back and neck pain follow-up  [id number]  [name] is a 77 y.o. female coming in with complaint of back and neck pain. OMT 08/30/2022. Patient had orthotics made since last visit. Patient states continues to have tightness.   Medications patient has been prescribed: None  Taking:         Reviewed prior external information including notes and imaging from previsou exam, outside providers and external EMR if available.   As well as notes that were available from care everywhere and other healthcare systems.  Past medical history, social, surgical and family history all reviewed in electronic medical record.  No pertanent information unless stated regarding to the chief complaint.   Past Medical History:  Diagnosis Date   High triglycerides    Hx of varicella     No Known Allergies   Review of Systems:  No headache, visual changes, nausea, vomiting, diarrhea, constipation, dizziness, abdominal pain, skin rash, fevers, chills, night sweats, weight loss, swollen lymph nodes, body aches, joint swelling, chest pain, shortness of breath, mood changes. POSITIVE muscle aches  Objective  Blood pressure 136/80, pulse 79, height 5\' 3"  (1.6 m), weight 125 lb (56.7 kg), SpO2 99 %.   General: No apparent distress alert and oriented x3 mood and affect normal, dressed appropriately.  HEENT: Pupils equal, extraocular movements intact  Respiratory: Patient's speak in full sentences and does not appear short of breath  Cardiovascular: No lower extremity edema, non tender, no erythema  Neck exam does have some loss lordosis noted.  Some tenderness to palpation in the paraspinal musculature.  Patient does have tightness noted on the left side  of the greater trochanteric area.  Osteopathic findings  C2 flexed rotated and side bent left C6 flexed rotated and side bent left T3 extended rotated and side bent right inhaled rib T9 extended rotated and side bent left L2 flexed rotated and side bent left Sacrum right on right       Assessment and Plan:  Neck pain on left side Continues to have some tenderness in this area.  Likely no significant radicular symptoms.  Discussed posture and ergonomics.  Still doing a lot of reading at the moment with patient not having as much activity outside because of the weather.  Follow-up again in 6 to 8 weeks    Nonallopathic problems  Decision today to treat with OMT was based on Physical Exam  After verbal consent patient was treated with HVLA, ME, FPR techniques in cervical, rib, thoracic, lumbar, and sacral  areas  Patient tolerated the procedure well with improvement in symptoms  Patient given exercises, stretches and lifestyle modifications  See medications in patient instructions if given  Patient will follow up in 4-8 weeks     The above documentation has been reviewed and is accurate and complete [name], DO         Note: This dictation was prepared with Dragon dictation along with smaller phrase technology. Any transcriptional errors that result from this process are unintentional.

## 2022-10-18 ENCOUNTER — Encounter: Payer: Self-pay | Admitting: Family Medicine

## 2022-10-18 ENCOUNTER — Ambulatory Visit: Payer: Medicare PPO | Admitting: Family Medicine

## 2022-10-18 VITALS — BP 136/80 | HR 79 | Ht 63.0 in | Wt 125.0 lb

## 2022-10-18 DIAGNOSIS — M9904 Segmental and somatic dysfunction of sacral region: Secondary | ICD-10-CM

## 2022-10-18 DIAGNOSIS — M9908 Segmental and somatic dysfunction of rib cage: Secondary | ICD-10-CM | POA: Diagnosis not present

## 2022-10-18 DIAGNOSIS — M9901 Segmental and somatic dysfunction of cervical region: Secondary | ICD-10-CM | POA: Diagnosis not present

## 2022-10-18 DIAGNOSIS — M542 Cervicalgia: Secondary | ICD-10-CM

## 2022-10-18 DIAGNOSIS — M9903 Segmental and somatic dysfunction of lumbar region: Secondary | ICD-10-CM | POA: Diagnosis not present

## 2022-10-18 DIAGNOSIS — M9902 Segmental and somatic dysfunction of thoracic region: Secondary | ICD-10-CM

## 2022-10-18 NOTE — Patient Instructions (Signed)
Good to see you! Tell the little fishes good luck at city meet See you again in 6-8 weeks

## 2022-10-18 NOTE — Assessment & Plan Note (Signed)
Continues to have some tenderness in this area.  Likely no significant radicular symptoms.  Discussed posture and ergonomics.  Still doing a lot of reading at the moment with patient not having as much activity outside because of the weather.  Follow-up again in 6 to 8 weeks

## 2022-10-25 DIAGNOSIS — M542 Cervicalgia: Secondary | ICD-10-CM | POA: Diagnosis not present

## 2022-10-25 DIAGNOSIS — M25559 Pain in unspecified hip: Secondary | ICD-10-CM | POA: Diagnosis not present

## 2022-11-20 DIAGNOSIS — M542 Cervicalgia: Secondary | ICD-10-CM | POA: Diagnosis not present

## 2022-11-20 DIAGNOSIS — M25559 Pain in unspecified hip: Secondary | ICD-10-CM | POA: Diagnosis not present

## 2022-11-28 NOTE — Progress Notes (Unsigned)
  [name] [address] [place] [address] Phone: [phone] Subjective:   [name], am serving as a scribe for Dr. Antoine Primas.  I'm seeing this patient by the request  of:  Panosh, [name], MD  CC: Back and neck pain follow-up  [id number]  Hosanna Moulin is a 77 y.o. female coming in with complaint of back and neck pain. OMT 10/18/2022. Patient states that she is doing well since last visit. Stiffness in neck and hip.   Medications patient has been prescribed: None  Taking:         Reviewed prior external information including notes and imaging from previsou exam, outside providers and external EMR if available.   As well as notes that were available from care everywhere and other healthcare systems.  Past medical history, social, surgical and family history all reviewed in electronic medical record.  No pertanent information unless stated regarding to the chief complaint.   Past Medical History:  Diagnosis Date   High triglycerides    Hx of varicella     No Known Allergies   Review of Systems:  No headache, visual changes, nausea, vomiting, diarrhea, constipation, dizziness, abdominal pain, skin rash, fevers, chills, night sweats, weight loss, swollen lymph nodes, body aches, joint swelling, chest pain, shortness of breath, mood changes. POSITIVE muscle aches  Objective  Blood pressure 122/84, pulse (!) 55, height 5\' 3"  (1.6 m), weight 125 lb (56.7 kg), SpO2 98%.   General: No apparent distress alert and oriented x3 mood and affect normal, dressed appropriately.  HEENT: Pupils equal, extraocular movements intact  Respiratory: Patient's speak in full sentences and does not appear short of breath  Cardiovascular: No lower extremity edema, non tender, no erythema  Back exam does have some loss lordosis noted.  This severe tenderness over the sacroiliac joint left greater than right.  Tenderness to palpation in the left  paraspinal musculature as well.  Osteopathic findings  C2 flexed rotated and side bent left C6 flexed rotated and side bent left T3 extended rotated and side bent right inhaled rib T9 extended rotated and side bent left L2 flexed rotated and side bent right Sacrum left on left       Assessment and Plan:  Left-sided back pain Still seems to be more in the sacroiliac but I am concerned for more of an L4-L5 and L5-S1 spondylolisthesis that could be causing some nerve impingement.  Discussed with patient about icing regimen and home exercises.  Discussed which activities to do and which ones to avoid.  Increase activity slowly.  Follow-up again in 6 to 8 weeks    Nonallopathic problems  Decision today to treat with OMT was based on Physical Exam  After verbal consent patient was treated with HVLA, ME, FPR techniques in cervical, rib, thoracic, lumbar, and sacral  areas  Patient tolerated the procedure well with improvement in symptoms  Patient given exercises, stretches and lifestyle modifications  See medications in patient instructions if given  Patient will follow up in 4-8 weeks     The above documentation has been reviewed and is accurate and complete [name], DO         Note: This dictation was prepared with Dragon dictation along with smaller phrase technology. Any transcriptional errors that result from this process are unintentional.

## 2022-11-29 ENCOUNTER — Encounter: Payer: Self-pay | Admitting: Family Medicine

## 2022-11-29 ENCOUNTER — Ambulatory Visit (INDEPENDENT_AMBULATORY_CARE_PROVIDER_SITE_OTHER): Payer: Medicare PPO | Admitting: Family Medicine

## 2022-11-29 VITALS — BP 122/84 | HR 55 | Ht 63.0 in | Wt 125.0 lb

## 2022-11-29 DIAGNOSIS — M9901 Segmental and somatic dysfunction of cervical region: Secondary | ICD-10-CM | POA: Diagnosis not present

## 2022-11-29 DIAGNOSIS — M9908 Segmental and somatic dysfunction of rib cage: Secondary | ICD-10-CM

## 2022-11-29 DIAGNOSIS — M9902 Segmental and somatic dysfunction of thoracic region: Secondary | ICD-10-CM | POA: Diagnosis not present

## 2022-11-29 DIAGNOSIS — M545 Low back pain, unspecified: Secondary | ICD-10-CM

## 2022-11-29 DIAGNOSIS — M25559 Pain in unspecified hip: Secondary | ICD-10-CM | POA: Diagnosis not present

## 2022-11-29 DIAGNOSIS — M9904 Segmental and somatic dysfunction of sacral region: Secondary | ICD-10-CM | POA: Diagnosis not present

## 2022-11-29 DIAGNOSIS — M9903 Segmental and somatic dysfunction of lumbar region: Secondary | ICD-10-CM | POA: Diagnosis not present

## 2022-11-29 DIAGNOSIS — M542 Cervicalgia: Secondary | ICD-10-CM | POA: Diagnosis not present

## 2022-11-29 NOTE — Assessment & Plan Note (Signed)
Still seems to be more in the sacroiliac but I am concerned for more of an L4-L5 and L5-S1 spondylolisthesis that could be causing some nerve impingement.  Discussed with patient about icing regimen and home exercises.  Discussed which activities to do and which ones to avoid.  Increase activity slowly.  Follow-up again in 6 to 8 weeks

## 2022-11-29 NOTE — Patient Instructions (Addendum)
Don't get washed away Glad you have fun playing with dinosaur bones See me in 6-7 weeks

## 2022-12-06 ENCOUNTER — Encounter (INDEPENDENT_AMBULATORY_CARE_PROVIDER_SITE_OTHER): Payer: Self-pay

## 2022-12-07 DIAGNOSIS — M542 Cervicalgia: Secondary | ICD-10-CM | POA: Diagnosis not present

## 2022-12-07 DIAGNOSIS — M25559 Pain in unspecified hip: Secondary | ICD-10-CM | POA: Diagnosis not present

## 2022-12-14 DIAGNOSIS — H401131 Primary open-angle glaucoma, bilateral, mild stage: Secondary | ICD-10-CM | POA: Diagnosis not present

## 2022-12-18 DIAGNOSIS — M542 Cervicalgia: Secondary | ICD-10-CM | POA: Diagnosis not present

## 2022-12-18 DIAGNOSIS — M25559 Pain in unspecified hip: Secondary | ICD-10-CM | POA: Diagnosis not present

## 2022-12-26 ENCOUNTER — Ambulatory Visit (INDEPENDENT_AMBULATORY_CARE_PROVIDER_SITE_OTHER): Payer: Medicare PPO

## 2022-12-26 VITALS — Ht 63.5 in | Wt 123.0 lb

## 2022-12-26 DIAGNOSIS — Z0000 Encounter for general adult medical examination without abnormal findings: Secondary | ICD-10-CM

## 2022-12-26 NOTE — Progress Notes (Signed)
Subjective:   Tina [name] is a 77 y.o. female who presents for Medicare Annual (Subsequent) preventive examination.  Visit Complete: Virtual  I connected with  Zakari Marotte on 12/26/22 by a audio enabled telemedicine application and verified that I am speaking with the correct person using two identifiers.  Patient Location: Home  Provider Location: Home Office  I discussed the limitations of evaluation and management by telemedicine. The patient expressed understanding and agreed to proceed.   Review of Systems    Vital Signs: Unable to obtain new vitals due to this being a telehealth visit.  Cardiac Risk Factors include: advanced age (>41men, >94 women)     Objective:    Today's Vitals   12/26/22 0817  Weight: 123 lb (55.8 kg)  Height: 5' 3.5" (1.613 m)   Body mass index is 21.45 kg/m.     12/26/2022    8:23 AM 12/21/2021    8:31 AM 12/20/2020    3:17 PM 11/16/2019   11:23 AM 07/26/2016    1:52 PM  Advanced Directives  Does Patient Have a Medical Advance Directive? Yes Yes Yes Yes Yes  Type of Estate agent of [place];Living will Healthcare Power of [place];Living will Healthcare Power of [place];Living will Healthcare Power of [place];Living will   Does patient want to make changes to medical advance directive?    No - Patient declined   Copy of Healthcare Power of Attorney in Chart? No - copy requested No - copy requested No - copy requested Yes - validated most recent copy scanned in chart (See row information)     Current Medications (verified) Outpatient Encounter Medications as of 12/26/2022  Medication Sig   CALCIUM CITRATE PO Take by mouth.   cetirizine (ZYRTEC) 10 MG tablet Take 10 mg by mouth daily.   cholecalciferol (VITAMIN D) 1000 units tablet Take 1,000 Units by mouth daily.   famotidine (PEPCID) 20 MG tablet Take 20 mg by mouth 2 (two) times daily.   Latanoprost 0.005 % EMUL Place into both eyes at bedtime.   MULTIPLE VITAMIN PO  Take by mouth.   Omega-3 Fatty Acids (OMEGA 3 PO) Take by mouth.   tiZANidine (ZANAFLEX) 4 MG tablet Take 1 tablet (4 mg total) by mouth at bedtime. (Patient taking differently: Take 4 mg by mouth. PRN)   TURMERIC PO Take by mouth.   No facility-administered encounter medications on file as of 12/26/2022.    Allergies (verified) Patient has no known allergies.   History: Past Medical History:  Diagnosis Date   High triglycerides    Hx of varicella    Past Surgical History:  Procedure Laterality Date   CHOLECYSTECTOMY     Family History  Problem Relation Age of Onset   Hypertension Mother    Hyperlipidemia Mother    Stroke Mother    Diabetes Father    Miscarriages / [place] Brother    Social History   Socioeconomic History   Marital status: Divorced    Spouse name: Not on file   Number of children: Not on file   Years of education: Not on file   Highest education level: Doctorate  Occupational History   Occupation: Professor    Employer: [employer]  Tobacco Use   Smoking status: Former    Current packs/day: 0.70    Average packs/day: 0.7 packs/day for 4.0 years (2.8 ttl pk-yrs)    Types: Cigarettes   Smokeless tobacco: Never   Tobacco comments:    in her 20's  Vaping Use   Vaping status: Never Used  Substance and Sexual Activity   Alcohol use: Yes    Alcohol/week: 1.0 standard drink of alcohol    Types: 1 Standard drinks or equivalent per week    Comment: ocassionally   Drug use: No   Sexual activity: Not on file  Other Topics Concern   Not on file  Social History Narrative    Now h[facility] 1 no pets    .    Mother  Moved down to friends home        Divorced one chid and grandchild   Neg ets firearms.    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    Exercises regularly      Neg tad    Social Determinants of Health   Financial Resource Strain: Low Risk  (12/26/2022)    Overall Financial Resource Strain (CARDIA)    Difficulty of Paying Living Expenses: Not hard at all  Food Insecurity: No Food Insecurity (12/26/2022)   Hunger Vital Sign    Worried About Running Out of Food in the Last Year: Never true    Ran Out of Food in the Last Year: Never true  Transportation Needs: No Transportation Needs (12/26/2022)   PRAPARE - Administrator, Civil Service (Medical): No    Lack of Transportation (Non-Medical): No  Physical Activity: Sufficiently Active (12/26/2022)   Exercise Vital Sign    Days of Exercise per Week: 7 days    Minutes of Exercise per Session: 30 min  Stress: No Stress Concern Present (12/26/2022)   [employer] of Occupational Health - Occupational Stress Questionnaire    Feeling of Stress : Not at all  Social Connections: Socially Integrated (12/26/2022)   Social Connection and Isolation Panel [NHANES]    Frequency of Communication with Friends and Family: More than three times a week    Frequency of Social Gatherings with Friends and Family: More than three times a week    Attends Religious Services: More than 4 times per year    Active Member of [employer] or Organizations: Yes    Attends Engineer, structural: More than 4 times per year    Marital Status: Married    Tobacco Counseling Counseling given: Not Answered Tobacco comments: in her 20's    Clinical Intake:  Pre-visit preparation completed: Yes  Pain : No/denies pain     BMI - recorded: 21.45 Nutritional Status: BMI of 19-24  Normal Nutritional Risks: None Diabetes: No  How often do you need to have someone help you when you read instructions, pamphlets, or other written materials from your doctor or pharmacy?: 1 - Never  Interpreter Needed?: No  Information entered by :: Theresa Mulligan LPN   Activities of Daily Living    12/26/2022    8:22 AM  In your present state of health, do you have any difficulty performing the following activities:  Hearing?  0  Vision? 0  Difficulty concentrating or making decisions? 0  Walking or climbing stairs? 0  Dressing or bathing? 0  Doing errands, shopping? 0  Preparing Food and eating ? N  Using the Toilet? N  In the past six months, have you accidently leaked urine? N  Do you have problems with loss of bowel control? N  Managing your Medications? N  Managing your Finances? N  Housekeeping or managing your Housekeeping? N    Patient Care Team: Panosh, [name], MD as PCP - General (Internal Medicine) Campbell Stall, MD as Attending Physician (Dermatology) Elliot Cousin, OD (Optometry)  Indicate any recent Medical Services you may have received from other than [facility] providers in the past year (date may be approximate).     Assessment:   This is a routine wellness examination for Tina [name].  Hearing/Vision screen Hearing Screening - Comments:: Denies hearing difficulties   Vision Screening - Comments:: Wears rx glasses - up to date with routine eye exams with  Dr Joseph Art  Dietary issues and exercise activities discussed:     Goals Addressed               This Visit's Progress     No current goals (pt-stated)        Complete my bucket list.       Depression Screen    12/26/2022    8:21 AM 01/03/2022   10:04 AM 12/21/2021    8:26 AM 09/11/2021   12:24 PM 12/20/2020    3:18 PM 12/20/2020    3:15 PM 11/16/2019   11:26 AM  PHQ 2/9 Scores  PHQ - 2 Score 0 0 0 0 0 0 0  PHQ- 9 Score  0  0   0    Fall Risk    12/26/2022    8:22 AM 01/03/2022   10:05 AM 12/21/2021    8:29 AM 09/11/2021   12:23 PM 09/10/2021    8:16 AM  Fall Risk   Falls in the past year? 1 0 0 0 0  Number falls in past yr: 0 0 0 0   Injury with Fall? 0 0 0 0   Risk for fall due to : No Fall Risks No Fall Risks No Fall Risks No Fall Risks   Follow up Falls prevention discussed Falls evaluation completed  Falls evaluation completed     MEDICARE RISK AT HOME: Medicare Risk at Home Any stairs in or around the home?:  Yes If so, are there any without handrails?: No Home free of loose throw rugs in walkways, pet beds, electrical cords, etc?: Yes Adequate lighting in your home to reduce risk of falls?: Yes Life alert?: No Use of a cane, walker or w/c?: No Grab bars in the bathroom?: No Shower chair or bench in shower?: Yes Elevated toilet seat or a handicapped toilet?: No  TIMED UP AND GO:  Was the test performed?  No    Cognitive Function:    07/26/2016    2:03 PM  MMSE - Mini Mental State Exam  Not completed: --        12/26/2022    8:23 AM 12/21/2021    8:31 AM 11/16/2019   11:28 AM  6CIT Screen  What Year? 0 points 0 points 0 points  What month? 0 points 0 points 0 points  What time? 0 points 0 points 0 points  Count back from 20 0 points 0 points 0 points  Months in reverse 0 points 0 points 0 points  Repeat phrase 0 points 0 points 0 points  Total Score 0 points 0 points 0 points    Immunizations Immunization History  Administered Date(s) Administered   Fluad Quad(high Dose 65+) 01/07/2019   Hepatitis B 09/09/1995, 10/11/1995, 07/03/1996   Influenza Split 01/22/2012   Influenza, High Dose Seasonal PF 03/02/2015, 02/03/2016, 01/14/2017, 02/03/2018   Influenza-Unspecified 02/03/2014, 01/14/2017   Moderna SARS-COV2 Booster Vaccination 08/31/2020  PFIZER(Purple Top)SARS-COV-2 Vaccination 05/12/2019, 05/31/2019   Pfizer Covid-19 Vaccine Bivalent Booster 64yrs & up 03/03/2021   Pneumococcal Conjugate-13 07/07/2012   Pneumococcal Polysaccharide-23 03/27/1999, 07/21/2013   Td 11/21/2016   Tdap 03/18/2006   Zoster Recombinant(Shingrix) 01/14/2017   Zoster, Live 05/12/2008    TDAP status: Up to date  Flu Vaccine status: Due, Education has been provided regarding the importance of this vaccine. Advised may receive this vaccine at local pharmacy or Health Dept. Aware to provide a copy of the vaccination record if obtained from local pharmacy or Health Dept. Verbalized acceptance and  understanding.  Pneumococcal vaccine status: Up to date  Covid-19 vaccine status: Declined, Education has been provided regarding the importance of this vaccine but patient still declined. Advised may receive this vaccine at local pharmacy or Health Dept.or vaccine clinic. Aware to provide a copy of the vaccination record if obtained from local pharmacy or Health Dept. Verbalized acceptance and understanding.  Qualifies for Shingles Vaccine? Yes   Zostavax completed Yes   Shingrix Completed?: Yes  Screening Tests Health Maintenance  Topic Date Due   Diabetic kidney evaluation - Urine ACR  07/11/2016   OPHTHALMOLOGY EXAM  11/21/2016   Zoster Vaccines- Shingrix (2 of 2) 03/11/2017   HEMOGLOBIN A1C  07/04/2022   Colonoscopy  09/13/2022   INFLUENZA VACCINE  11/22/2022   COVID-19 Vaccine (5 - 2023-24 season) 12/23/2022   Diabetic kidney evaluation - eGFR measurement  01/04/2023   MAMMOGRAM  12/29/2022   Medicare Annual Wellness (AWV)  12/26/2023   DTaP/Tdap/Td (3 - Td or Tdap) 11/22/2026   Pneumonia Vaccine 30+ Years old  Completed   DEXA SCAN  Completed   Hepatitis C Screening  Completed   HPV VACCINES  Aged Out    Health Maintenance  Health Maintenance Due  Topic Date Due   Diabetic kidney evaluation - Urine ACR  07/11/2016   OPHTHALMOLOGY EXAM  11/21/2016   Zoster Vaccines- Shingrix (2 of 2) 03/11/2017   HEMOGLOBIN A1C  07/04/2022   Colonoscopy  09/13/2022   INFLUENZA VACCINE  11/22/2022   COVID-19 Vaccine (5 - 2023-24 season) 12/23/2022   Diabetic kidney evaluation - eGFR measurement  01/04/2023    Colorectal cancer screening: No longer required.   Mammogram status: Completed 12/28/21. Repeat every year  Bone Density status: Completed 12/22/20. Results reflect: Bone density results: OSTEOPENIA. Repeat every   years.  Lung Cancer Screening: (Low Dose CT Chest recommended if Age 68-80 years, 20 pack-year currently smoking OR have quit w/in 15years.) does not qualify.      Additional Screening:  Hepatitis C Screening: does qualify; Completed 11/16/19  Vision Screening: Recommended annual ophthalmology exams for early detection of glaucoma and other disorders of the eye. Is the patient up to date with their annual eye exam?  Yes  Who is the provider or what is the name of the office in which the patient attends annual eye exams? [facility] If pt is not established with a provider, would they like to be referred to a provider to establish care? No .   Dental Screening: Recommended annual dental exams for proper oral hygiene   Community Resource Referral / Chronic Care Management:  CRR required this visit?  No   CCM required this visit?  No     Plan:     I have personally reviewed and noted the following in the patient's chart:   Medical and social history Use of alcohol, tobacco or illicit drugs  Current medications and supplements including  opioid prescriptions. Patient is not currently taking opioid prescriptions. Functional ability and status Nutritional status Physical activity Advanced directives List of other physicians Hospitalizations, surgeries, and ER visits in previous 12 months Vitals Screenings to include cognitive, depression, and falls Referrals and appointments  In addition, I have reviewed and discussed with patient certain preventive protocols, quality metrics, and best practice recommendations. A written personalized care plan for preventive services as well as general preventive health recommendations were provided to patient.     Tillie Rung, LPN   12/28/2950   After Visit Summary: (MyChart) Due to this being a telephonic visit, the after visit summary with patients personalized plan was offered to patient via MyChart   Nurse Notes: None

## 2022-12-26 NOTE — Patient Instructions (Addendum)
Ms. Shoots , Thank you for taking time to come for your Medicare Wellness Visit. I appreciate your ongoing commitment to your health goals. Please review the following plan we discussed and let me know if I can assist you in the future.   Referrals/Orders/Follow-Ups/Clinician Recommendations:   This is a list of the screening recommended for you and due dates:  Health Maintenance  Topic Date Due   Yearly kidney health urinalysis for diabetes  07/11/2016   Eye exam for diabetics  11/21/2016   Zoster (Shingles) Vaccine (2 of 2) 03/11/2017   Hemoglobin A1C  07/04/2022   Colon Cancer Screening  09/13/2022   Flu Shot  11/22/2022   COVID-19 Vaccine (5 - 2023-24 season) 12/23/2022   Yearly kidney function blood test for diabetes  01/04/2023   Mammogram  12/29/2022   Medicare Annual Wellness Visit  12/26/2023   DTaP/Tdap/Td vaccine (3 - Td or Tdap) 11/22/2026   Pneumonia Vaccine  Completed   DEXA scan (bone density measurement)  Completed   Hepatitis C Screening  Completed   HPV Vaccine  Aged Out    Advanced directives: (Copy Requested) Please bring a copy of your health care power of attorney and living will to the office to be added to your chart at your convenience.  Next Medicare Annual Wellness Visit scheduled for next year: Yes

## 2022-12-27 DIAGNOSIS — M542 Cervicalgia: Secondary | ICD-10-CM | POA: Diagnosis not present

## 2022-12-27 DIAGNOSIS — M25559 Pain in unspecified hip: Secondary | ICD-10-CM | POA: Diagnosis not present

## 2023-01-08 DIAGNOSIS — M25559 Pain in unspecified hip: Secondary | ICD-10-CM | POA: Diagnosis not present

## 2023-01-08 DIAGNOSIS — M542 Cervicalgia: Secondary | ICD-10-CM | POA: Diagnosis not present

## 2023-01-09 NOTE — Progress Notes (Unsigned)
  [name] [address] [place] [address] Phone: [phone] Subjective:   Tina [name], am serving as a scribe for Dr. Antoine Primas.  I'm seeing this patient by the request  of:  Panosh, [name], MD  CC: back and neck   [id number]  Lyncoln Housler is a 77 y.o. female coming in with complaint of back and neck pain. OMT 11/29/2022. Patient states that she has been aching in L hip. No pain with movement.   Medications patient has been prescribed: None  Taking:         Reviewed prior external information including notes and imaging from previsou exam, outside providers and external EMR if available.   As well as notes that were available from care everywhere and other healthcare systems.  Past medical history, social, surgical and family history all reviewed in electronic medical record.  No pertanent information unless stated regarding to the chief complaint.   Past Medical History:  Diagnosis Date   High triglycerides    Hx of varicella     No Known Allergies   Review of Systems:  No headache, visual changes, nausea, vomiting, diarrhea, constipation, dizziness, abdominal pain, skin rash, fevers, chills, night sweats, weight loss, swollen lymph nodes, body aches, joint swelling, chest pain, shortness of breath, mood changes. POSITIVE muscle aches  Objective  Blood pressure 102/82, pulse 70, height 5\' 3"  (1.6 m), weight 124 lb (56.2 kg), SpO2 98%.   General: No apparent distress alert and oriented x3 mood and affect normal, dressed appropriately.  HEENT: Pupils equal, extraocular movements intact  Respiratory: Patient's speak in full sentences and does not appear short of breath  Cardiovascular: No lower extremity edema, non tender, no erythema  MSK:  Back Loss of lordosis tightness in the left back and gluteal tendon.  Patient does have tightness noted.  Negative straight leg test.  Osteopathic findings  C3 flexed rotated  and side bent right C7 flexed rotated and side bent left T3 extended rotated and side bent right inhaled rib T9 extended rotated and side bent left L1 flexed rotated and side bent right Sacrum right on right       Assessment and Plan:  Greater trochanteric bursitis of left hip Continues to have some discomfort.  Discussed with patient icing regimen and home exercises and is continuing to work on the hip abductor strengthening.  Discussed which activities to do and which ones to.  Increase to 50 slowly.  Follow-up again in 6 to 8 weeks.    Nonallopathic problems  Decision today to treat with OMT was based on Physical Exam  After verbal consent patient was treated with HVLA, ME, FPR techniques in cervical, rib, thoracic, lumbar, and sacral  areas  Patient tolerated the procedure well with improvement in symptoms  Patient given exercises, stretches and lifestyle modifications  See medications in patient instructions if given  Patient will follow up in 4-8 weeks    The above documentation has been reviewed and is accurate and complete [name], DO          Note: This dictation was prepared with Dragon dictation along with smaller phrase technology. Any transcriptional errors that result from this process are unintentional.

## 2023-01-10 ENCOUNTER — Ambulatory Visit: Payer: Medicare PPO | Admitting: Family Medicine

## 2023-01-10 VITALS — BP 102/82 | HR 70 | Ht 63.0 in | Wt 124.0 lb

## 2023-01-10 DIAGNOSIS — M9904 Segmental and somatic dysfunction of sacral region: Secondary | ICD-10-CM | POA: Diagnosis not present

## 2023-01-10 DIAGNOSIS — M9901 Segmental and somatic dysfunction of cervical region: Secondary | ICD-10-CM | POA: Diagnosis not present

## 2023-01-10 DIAGNOSIS — M9908 Segmental and somatic dysfunction of rib cage: Secondary | ICD-10-CM | POA: Diagnosis not present

## 2023-01-10 DIAGNOSIS — M7062 Trochanteric bursitis, left hip: Secondary | ICD-10-CM | POA: Diagnosis not present

## 2023-01-10 DIAGNOSIS — M9902 Segmental and somatic dysfunction of thoracic region: Secondary | ICD-10-CM

## 2023-01-10 DIAGNOSIS — M9903 Segmental and somatic dysfunction of lumbar region: Secondary | ICD-10-CM

## 2023-01-10 NOTE — Patient Instructions (Signed)
Tina [name]  Thank your for hard work with voting See you again in 5-6 weeks

## 2023-01-10 NOTE — Assessment & Plan Note (Signed)
Continues to have some discomfort.  Discussed with patient icing regimen and home exercises and is continuing to work on the hip abductor strengthening.  Discussed which activities to do and which ones to.  Increase to 50 slowly.  Follow-up again in 6 to 8 weeks.

## 2023-01-16 DIAGNOSIS — M542 Cervicalgia: Secondary | ICD-10-CM | POA: Diagnosis not present

## 2023-01-16 DIAGNOSIS — M25559 Pain in unspecified hip: Secondary | ICD-10-CM | POA: Diagnosis not present

## 2023-01-24 DIAGNOSIS — L578 Other skin changes due to chronic exposure to nonionizing radiation: Secondary | ICD-10-CM | POA: Diagnosis not present

## 2023-01-24 DIAGNOSIS — Z85828 Personal history of other malignant neoplasm of skin: Secondary | ICD-10-CM | POA: Diagnosis not present

## 2023-01-24 DIAGNOSIS — L821 Other seborrheic keratosis: Secondary | ICD-10-CM | POA: Diagnosis not present

## 2023-01-24 DIAGNOSIS — D1801 Hemangioma of skin and subcutaneous tissue: Secondary | ICD-10-CM | POA: Diagnosis not present

## 2023-01-24 DIAGNOSIS — L814 Other melanin hyperpigmentation: Secondary | ICD-10-CM | POA: Diagnosis not present

## 2023-01-24 DIAGNOSIS — Z86018 Personal history of other benign neoplasm: Secondary | ICD-10-CM | POA: Diagnosis not present

## 2023-01-24 DIAGNOSIS — L249 Irritant contact dermatitis, unspecified cause: Secondary | ICD-10-CM | POA: Diagnosis not present

## 2023-01-24 DIAGNOSIS — D229 Melanocytic nevi, unspecified: Secondary | ICD-10-CM | POA: Diagnosis not present

## 2023-01-31 DIAGNOSIS — M25559 Pain in unspecified hip: Secondary | ICD-10-CM | POA: Diagnosis not present

## 2023-01-31 DIAGNOSIS — M542 Cervicalgia: Secondary | ICD-10-CM | POA: Diagnosis not present

## 2023-02-06 DIAGNOSIS — M542 Cervicalgia: Secondary | ICD-10-CM | POA: Diagnosis not present

## 2023-02-06 DIAGNOSIS — M25559 Pain in unspecified hip: Secondary | ICD-10-CM | POA: Diagnosis not present

## 2023-02-20 NOTE — Progress Notes (Deleted)
  [name] [address] [place] [address] Phone: [phone] Subjective:    I'm seeing this patient by the request  of:  Panosh, [name], MD  CC:   [id number]  Tina [name] is a 77 y.o. female coming in with complaint of back and neck pain. OMT 01/10/2023. Patient states   Medications patient has been prescribed: None  Taking:         Reviewed prior external information including notes and imaging from previsou exam, outside providers and external EMR if available.   As well as notes that were available from care everywhere and other healthcare systems.  Past medical history, social, surgical and family history all reviewed in electronic medical record.  No pertanent information unless stated regarding to the chief complaint.   Past Medical History:  Diagnosis Date   High triglycerides    Hx of varicella     No Known Allergies   Review of Systems:  No headache, visual changes, nausea, vomiting, diarrhea, constipation, dizziness, abdominal pain, skin rash, fevers, chills, night sweats, weight loss, swollen lymph nodes, body aches, joint swelling, chest pain, shortness of breath, mood changes. POSITIVE muscle aches  Objective  There were no vitals taken for this visit.   General: No apparent distress alert and oriented x3 mood and affect normal, dressed appropriately.  HEENT: Pupils equal, extraocular movements intact  Respiratory: Patient's speak in full sentences and does not appear short of breath  Cardiovascular: No lower extremity edema, non tender, no erythema  Gait MSK:  Back   Osteopathic findings  C2 flexed rotated and side bent right C6 flexed rotated and side bent left T3 extended rotated and side bent right inhaled rib T9 extended rotated and side bent left L2 flexed rotated and side bent right Sacrum right on right       Assessment and Plan:  No problem-specific Assessment & Plan notes found for  this encounter.    Nonallopathic problems  Decision today to treat with OMT was based on Physical Exam  After verbal consent patient was treated with HVLA, ME, FPR techniques in cervical, rib, thoracic, lumbar, and sacral  areas  Patient tolerated the procedure well with improvement in symptoms  Patient given exercises, stretches and lifestyle modifications  See medications in patient instructions if given  Patient will follow up in 4-8 weeks             Note: This dictation was prepared with Dragon dictation along with smaller phrase technology. Any transcriptional errors that result from this process are unintentional.

## 2023-02-21 ENCOUNTER — Ambulatory Visit: Payer: Medicare PPO | Admitting: Family Medicine

## 2023-02-26 NOTE — Progress Notes (Unsigned)
  Tina [name] [address] [place] [address] Phone: [phone] Subjective:   [name], am serving as a scribe for Dr. Antoine Primas.  I'm seeing this patient by the request  of:  Panosh, [name], MD  CC: Back and neck pain follow-up  [id number]  Tina [name] is a 77 y.o. female coming in with complaint of back and neck pain. OMT on 01/10/2023.  Patient also has some greater trochanteric bursitis at last exam.  Patient states that her neck is tight from recent international flight. Unable to sleep due to shooting pain up into her head. Pain worse in L trap.   Pain in hip has improved.   Medications patient has been prescribed:   Taking:         Reviewed prior external information including notes and imaging from previsou exam, outside providers and external EMR if available.   As well as notes that were available from care everywhere and other healthcare systems.  Past medical history, social, surgical and family history all reviewed in electronic medical record.  No pertanent information unless stated regarding to the chief complaint.   Past Medical History:  Diagnosis Date   High triglycerides    Hx of varicella     No Known Allergies   Review of Systems:  No headache, visual changes, nausea, vomiting, diarrhea, constipation, dizziness, abdominal pain, skin rash, fevers, chills, night sweats, weight loss, swollen lymph nodes, body aches, joint swelling, chest pain, shortness of breath, mood changes. POSITIVE muscle aches  Objective  There were no vitals taken for this visit.   General: No apparent distress alert and oriented x3 mood and affect normal, dressed appropriately.  HEENT: Pupils equal, extraocular movements intact  Respiratory: Patient's speak in full sentences and does not appear short of breath  Cardiovascular: No lower extremity edema, non tender, no erythema  MSK:  Back does have more tightness noted on  the left side.  Pain in the left sacroiliac joint also noted.  Mild over the greater trochanteric area.  Neck exam does have some mild loss lordosis noted.  Osteopathic findings  C2 flexed rotated and side bent right C7 flexed rotated and side bent left T3 extended rotated and side bent right inhaled rib T8 extended rotated and side bent left L2 flexed rotated and side bent right Sacrum right on right    Assessment and Plan:  No problem-specific Assessment & Plan notes found for this encounter.    Nonallopathic problems  Decision today to treat with OMT was based on Physical Exam  After verbal consent patient was treated with HVLA, ME, FPR techniques in cervical, rib, thoracic, lumbar, and sacral  areas  Patient tolerated the procedure well with improvement in symptoms  Patient given exercises, stretches and lifestyle modifications  See medications in patient instructions if given  Patient will follow up in 4-8 weeks    The above documentation has been reviewed and is accurate and complete [name], DO          Note: This dictation was prepared with Dragon dictation along with smaller phrase technology. Any transcriptional errors that result from this process are unintentional.

## 2023-02-27 ENCOUNTER — Encounter: Payer: Self-pay | Admitting: Family Medicine

## 2023-02-27 ENCOUNTER — Ambulatory Visit: Payer: Medicare PPO | Admitting: Family Medicine

## 2023-02-27 VITALS — BP 122/82 | HR 63 | Ht 63.0 in | Wt 125.0 lb

## 2023-02-27 DIAGNOSIS — M9904 Segmental and somatic dysfunction of sacral region: Secondary | ICD-10-CM | POA: Diagnosis not present

## 2023-02-27 DIAGNOSIS — M9908 Segmental and somatic dysfunction of rib cage: Secondary | ICD-10-CM

## 2023-02-27 DIAGNOSIS — M9901 Segmental and somatic dysfunction of cervical region: Secondary | ICD-10-CM | POA: Diagnosis not present

## 2023-02-27 DIAGNOSIS — M545 Low back pain, unspecified: Secondary | ICD-10-CM | POA: Diagnosis not present

## 2023-02-27 DIAGNOSIS — M9902 Segmental and somatic dysfunction of thoracic region: Secondary | ICD-10-CM | POA: Diagnosis not present

## 2023-02-27 DIAGNOSIS — M9903 Segmental and somatic dysfunction of lumbar region: Secondary | ICD-10-CM

## 2023-02-27 MED ORDER — MELOXICAM 15 MG PO TABS: 15.0000 mg | ORAL_TABLET | Freq: Every day | ORAL | 0 refills | Status: DC

## 2023-02-27 NOTE — Patient Instructions (Addendum)
Meloxicam 15mg  daily for 5 days See me again in 6 weeks

## 2023-02-27 NOTE — Assessment & Plan Note (Addendum)
Continues as noted.  Discussed icing regimen and home exercises, which activities to do and which ones to avoid, increase activity slowly over the course of next several weeks.  Follow-up with me again in 6 to 8 weeks.  Prescription given for meloxicam today.

## 2023-03-07 DIAGNOSIS — M810 Age-related osteoporosis without current pathological fracture: Secondary | ICD-10-CM | POA: Diagnosis not present

## 2023-03-07 DIAGNOSIS — M8588 Other specified disorders of bone density and structure, other site: Secondary | ICD-10-CM | POA: Diagnosis not present

## 2023-03-07 DIAGNOSIS — Z1231 Encounter for screening mammogram for malignant neoplasm of breast: Secondary | ICD-10-CM | POA: Diagnosis not present

## 2023-03-07 LAB — HM MAMMOGRAPHY

## 2023-03-07 LAB — HM DEXA SCAN

## 2023-03-08 ENCOUNTER — Encounter: Payer: Self-pay | Admitting: Internal Medicine

## 2023-03-11 DIAGNOSIS — M542 Cervicalgia: Secondary | ICD-10-CM | POA: Diagnosis not present

## 2023-03-11 DIAGNOSIS — M25559 Pain in unspecified hip: Secondary | ICD-10-CM | POA: Diagnosis not present

## 2023-03-14 DIAGNOSIS — H401131 Primary open-angle glaucoma, bilateral, mild stage: Secondary | ICD-10-CM | POA: Diagnosis not present

## 2023-03-14 DIAGNOSIS — M542 Cervicalgia: Secondary | ICD-10-CM | POA: Diagnosis not present

## 2023-03-14 DIAGNOSIS — M25559 Pain in unspecified hip: Secondary | ICD-10-CM | POA: Diagnosis not present

## 2023-03-20 DIAGNOSIS — M542 Cervicalgia: Secondary | ICD-10-CM | POA: Diagnosis not present

## 2023-03-20 DIAGNOSIS — M25559 Pain in unspecified hip: Secondary | ICD-10-CM | POA: Diagnosis not present

## 2023-03-26 ENCOUNTER — Other Ambulatory Visit: Payer: Self-pay | Admitting: Family Medicine

## 2023-03-29 DIAGNOSIS — M25559 Pain in unspecified hip: Secondary | ICD-10-CM | POA: Diagnosis not present

## 2023-03-29 DIAGNOSIS — M542 Cervicalgia: Secondary | ICD-10-CM | POA: Diagnosis not present

## 2023-04-07 NOTE — Progress Notes (Signed)
Can review at next yearly visit in Jan 25  t score -2.4 spine  prev -2.5 .

## 2023-04-09 NOTE — Progress Notes (Unsigned)
[name] [address] [place] [address] Phone: [phone] Subjective:   Tina [name], am serving as a scribe for Dr. Antoine Primas.  I'm seeing this patient by the request  of:  Panosh, [name], MD  CC: Back and neck pain follow-up  [id number]  Tina [name] is a 77 y.o. female coming in with complaint of back and neck pain. OMT on 02/27/2023. Patient states that she is tight due to sleeping in hospital with her ailing father.  Unfortunately he did pass away.  Patient is having some tearfulness  Medications patient has been prescribed: Mobic  Taking: Yes         Reviewed prior external information including notes and imaging from previsou exam, outside providers and external EMR if available.   As well as notes that were available from care everywhere and other healthcare systems.  Past medical history, social, surgical and family history all reviewed in electronic medical record.  No pertanent information unless stated regarding to the chief complaint.   Past Medical History:  Diagnosis Date   High triglycerides    Hx of varicella     No Known Allergies   Review of Systems:  No headache, visual changes, nausea, vomiting, diarrhea, constipation, dizziness, abdominal pain, skin rash, fevers, chills, night sweats, weight loss, swollen lymph nodes, body aches, joint swelling, chest pain, shortness of breath, mood changes. POSITIVE muscle aches  Objective  Blood pressure 110/78, pulse 65, height 5\' 3"  (1.6 m), weight 126 lb (57.2 kg), SpO2 100%.   General: No apparent distress alert and oriented x3 mood and affect normal, dressed appropriately.  HEENT: Pupils equal, extraocular movements intact  Respiratory: Patient's speak in full sentences and does not appear short of breath  Cardiovascular: No lower extremity edema, non tender, no erythema  Gait MSK:  Back tightness still noted in the left gluteal area.  Mild positive  FABER test noted.  Negative straight leg test, does have worsening pain though in the paraspinal musculature on the left side.  Osteopathic findings  C2 flexed rotated and side bent left C6 flexed rotated and side bent left T3 extended rotated and side bent left inhaled rib T9 extended rotated and side bent left        Assessment and Plan:  Neck pain on left side Significant increase in pain in the neck noted today.  The patient has been not sleeping in the bed and been in the hospital and now some increasing stress with the very recent passing of her father.  Could consider different medications but patient at this moment would like to see if what we are doing will continue to work.  Discussed icing regimen, discussed which activities to do and which ones to avoid.  Follow-up again in 6 to 8 weeks meloxicam 15 mg daily    Nonallopathic problems  Decision today to treat with OMT was based on Physical Exam  After verbal consent patient was treated with HVLA, ME, FPR techniques in cervical, rib, thoracic, areas  Patient tolerated the procedure well with improvement in symptoms  Patient given exercises, stretches and lifestyle modifications  See medications in patient instructions if given  Patient will follow up in 4-8 weeks     The above documentation has been reviewed and is accurate and complete [name], DO         Note: This dictation was prepared with Dragon dictation along with smaller phrase technology. Any transcriptional errors that result  from this process are unintentional.

## 2023-04-10 ENCOUNTER — Ambulatory Visit: Payer: Medicare PPO | Admitting: Family Medicine

## 2023-04-10 ENCOUNTER — Encounter: Payer: Self-pay | Admitting: Family Medicine

## 2023-04-10 VITALS — BP 110/78 | HR 65 | Ht 63.0 in | Wt 126.0 lb

## 2023-04-10 DIAGNOSIS — M9902 Segmental and somatic dysfunction of thoracic region: Secondary | ICD-10-CM

## 2023-04-10 DIAGNOSIS — M9901 Segmental and somatic dysfunction of cervical region: Secondary | ICD-10-CM

## 2023-04-10 DIAGNOSIS — M542 Cervicalgia: Secondary | ICD-10-CM

## 2023-04-10 DIAGNOSIS — M9908 Segmental and somatic dysfunction of rib cage: Secondary | ICD-10-CM | POA: Diagnosis not present

## 2023-04-10 NOTE — Patient Instructions (Signed)
We are here for you See me again in 5 weeks

## 2023-04-10 NOTE — Assessment & Plan Note (Addendum)
Significant increase in pain in the neck noted today.  The patient has been not sleeping in the bed and been in the hospital and now some increasing stress with the very recent passing of her father.  Could consider different medications but patient at this moment would like to see if what we are doing will continue to work.  Discussed icing regimen, discussed which activities to do and which ones to avoid.  Follow-up again in 6 to 8 weeks meloxicam 15 mg daily

## 2023-04-11 DIAGNOSIS — M542 Cervicalgia: Secondary | ICD-10-CM | POA: Diagnosis not present

## 2023-04-11 DIAGNOSIS — M25559 Pain in unspecified hip: Secondary | ICD-10-CM | POA: Diagnosis not present

## 2023-04-18 ENCOUNTER — Encounter: Payer: Medicare PPO | Admitting: Internal Medicine

## 2023-04-18 DIAGNOSIS — M542 Cervicalgia: Secondary | ICD-10-CM | POA: Diagnosis not present

## 2023-04-18 DIAGNOSIS — M25559 Pain in unspecified hip: Secondary | ICD-10-CM | POA: Diagnosis not present

## 2023-04-30 DIAGNOSIS — M25559 Pain in unspecified hip: Secondary | ICD-10-CM | POA: Diagnosis not present

## 2023-04-30 DIAGNOSIS — M542 Cervicalgia: Secondary | ICD-10-CM | POA: Diagnosis not present

## 2023-05-14 DIAGNOSIS — M542 Cervicalgia: Secondary | ICD-10-CM | POA: Diagnosis not present

## 2023-05-14 DIAGNOSIS — M25559 Pain in unspecified hip: Secondary | ICD-10-CM | POA: Diagnosis not present

## 2023-05-14 NOTE — Progress Notes (Signed)
  [name] [address] [place] [address] Phone: [phone] Subjective:   INadine [name], am serving as a scribe for Dr. Antoine [name].  I'm seeing this patient by the request  of:  Panosh, [name], MD  CC: Back and neck pain follow-up  [id number]  Tina [name] is a 78 y.o. female coming in with complaint of back and neck pain. OMT on 02/27/2023. Patient states same per usual. No new concerns.  Has had some tightness but nothing severe at the moment.  Patient has been able to do more of her activities.  No exacerbation with this doctor from significant activities.  Medications patient has been prescribed: Mobic  Taking: Intermittently       Past Medical History:  Diagnosis Date   High triglycerides    Hx of varicella     No Known Allergies   Review of Systems:  No headache, visual changes, nausea, vomiting, diarrhea, constipation, dizziness, abdominal pain, skin rash, fevers, chills, night sweats, weight loss, swollen lymph nodes, body aches, joint swelling, chest pain, shortness of breath, mood changes. POSITIVE muscle aches  Objective  Blood pressure 126/80, pulse 71, height 5\' 3"  (1.6 m), weight 125 lb (56.7 kg), SpO2 98%.   General: No apparent distress alert and oriented x3 mood and affect normal, dressed appropriately.  HEENT: Pupils equal, extraocular movements intact  Respiratory: Patient's speak in full sentences and does not appear short of breath  Cardiovascular: No lower extremity edema, non tender, no erythema  Neck exam still has some tightness noted on the left side of the neck.  Very mild overall.  Some limited sidebending bilaterally.  Some tightness noted in the scapular area.  Osteopathic findings  C2 flexed rotated and side bent right C5 flexed rotated and side bent left T5 extended rotated and side bent left inhaled rib L1 flexed rotated and side bent right Sacrum right on right       Assessment and  Plan:  Neck pain on left side Will continue to monitor.  No other significant changes.  Does respond well to osteopathic manipulation.  Meloxicam 15 mg as needed    Nonallopathic problems  Decision today to treat with OMT was based on Physical Exam  After verbal consent patient was treated with HVLA, ME, FPR techniques in cervical, rib, thoracic, lumbar, and sacral  areas  Patient tolerated the procedure well with improvement in symptoms  Patient given exercises, stretches and lifestyle modifications  See medications in patient instructions if given  Patient will follow up in 4-8 weeks     The above documentation has been reviewed and is accurate and complete [name], DO         Note: This dictation was prepared with Dragon dictation along with smaller phrase technology. Any transcriptional errors that result from this process are unintentional.

## 2023-05-16 ENCOUNTER — Ambulatory Visit: Payer: Medicare PPO | Admitting: Family Medicine

## 2023-05-16 VITALS — BP 126/80 | HR 71 | Ht 63.0 in | Wt 125.0 lb

## 2023-05-16 DIAGNOSIS — M9908 Segmental and somatic dysfunction of rib cage: Secondary | ICD-10-CM | POA: Diagnosis not present

## 2023-05-16 DIAGNOSIS — M9903 Segmental and somatic dysfunction of lumbar region: Secondary | ICD-10-CM

## 2023-05-16 DIAGNOSIS — M9902 Segmental and somatic dysfunction of thoracic region: Secondary | ICD-10-CM

## 2023-05-16 DIAGNOSIS — M542 Cervicalgia: Secondary | ICD-10-CM

## 2023-05-16 DIAGNOSIS — M9901 Segmental and somatic dysfunction of cervical region: Secondary | ICD-10-CM | POA: Diagnosis not present

## 2023-05-16 DIAGNOSIS — M9904 Segmental and somatic dysfunction of sacral region: Secondary | ICD-10-CM | POA: Diagnosis not present

## 2023-05-16 NOTE — Patient Instructions (Signed)
Good to see you  Thanks for making me laugh  Follow up in 6 weeks

## 2023-05-18 ENCOUNTER — Encounter: Payer: Self-pay | Admitting: Family Medicine

## 2023-05-18 NOTE — Assessment & Plan Note (Signed)
Will continue to monitor.  No other significant changes.  Does respond well to osteopathic manipulation.  Meloxicam 15 mg as needed

## 2023-05-21 NOTE — Progress Notes (Unsigned)
No chief complaint on file.   HPI: Patient  Tina [name]  78 y.o. comes in today for Preventive Health Care visit   Health Maintenance  Topic Date Due   Diabetic kidney evaluation - Urine ACR  07/11/2016   OPHTHALMOLOGY EXAM  11/21/2016   Zoster Vaccines- Shingrix (2 of 2) 03/11/2017   HEMOGLOBIN A1C  07/04/2022   INFLUENZA VACCINE  11/22/2022   COVID-19 Vaccine (5 - 2024-25 season) 12/23/2022   Diabetic kidney evaluation - eGFR measurement  01/04/2023   Medicare Annual Wellness (AWV)  12/26/2023   MAMMOGRAM  03/06/2024   DTaP/Tdap/Td (3 - Td or Tdap) 11/22/2026   Pneumonia Vaccine 44+ Years old  Completed   DEXA SCAN  Completed   Hepatitis C Screening  Completed   HPV VACCINES  Aged Out   Colonoscopy  Discontinued   Health Maintenance Review LIFESTYLE:  Exercise:   Tobacco/ETS: Alcohol:  Sugar beverages: Sleep: Drug use: no HH of  Work:    ROS:  GEN/ HEENT: No fever, significant weight changes sweats headaches vision problems hearing changes, CV/ PULM; No chest pain shortness of breath cough, syncope,edema  change in exercise tolerance. GI /GU: No adominal pain, vomiting, change in bowel habits. No blood in the stool. No significant GU symptoms. SKIN/HEME: ,no acute skin rashes suspicious lesions or bleeding. No lymphadenopathy, nodules, masses.  NEURO/ PSYCH:  No neurologic signs such as weakness numbness. No depression anxiety. IMM/ Allergy: No unusual infections.  Allergy .   REST of 12 system review negative except as per HPI   Past Medical History:  Diagnosis Date   High triglycerides    Hx of varicella     Past Surgical History:  Procedure Laterality Date   CHOLECYSTECTOMY      Family History  Problem Relation Age of Onset   Hypertension Mother    Hyperlipidemia Mother    Stroke Mother    Diabetes Father    Miscarriages / [place] Brother     Social History   Socioeconomic History   Marital status: Divorced    Spouse name: Not on  file   Number of children: Not on file   Years of education: Not on file   Highest education level: Doctorate  Occupational History   Occupation: Professor    Employer: [employer]  Tobacco Use   Smoking status: Former    Current packs/day: 0.70    Average packs/day: 0.7 packs/day for 4.0 years (2.8 ttl pk-yrs)    Types: Cigarettes   Smokeless tobacco: Never   Tobacco comments:    in her 20's   Vaping Use   Vaping status: Never Used  Substance and Sexual Activity   Alcohol use: Yes    Alcohol/week: 1.0 standard drink of alcohol    Types: 1 Standard drinks or equivalent per week    Comment: ocassionally   Drug use: No   Sexual activity: Not on file  Other Topics Concern   Not on file  Social History Narrative    Now [facility] no pets    .    Mother  Moved down to friends home        Divorced one chid and grandchild   Neg ets firearms.    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    Exercises regularly      Neg tad  Social Drivers of Corporate investment banker Strain: Low Risk  (05/18/2023)   Overall Financial Resource Strain (CARDIA)    Difficulty of Paying Living Expenses: Not hard at all  Food Insecurity: No Food Insecurity (05/18/2023)   Hunger Vital Sign    Worried About Running Out of Food in the Last Year: Never true    Ran Out of Food in the Last Year: Never true  Transportation Needs: No Transportation Needs (05/18/2023)   PRAPARE - Administrator, Civil Service (Medical): No    Lack of Transportation (Non-Medical): No  Physical Activity: Sufficiently Active (05/18/2023)   Exercise Vital Sign    Days of Exercise per Week: 7 days    Minutes of Exercise per Session: 60 min  Stress: No Stress Concern Present (05/18/2023)   [employer] of Occupational Health - Occupational Stress Questionnaire    Feeling of Stress : Not at all  Social Connections: Moderately Isolated  (05/18/2023)   Social Connection and Isolation Panel [NHANES]    Frequency of Communication with Friends and Family: More than three times a week    Frequency of Social Gatherings with Friends and Family: More than three times a week    Attends Religious Services: Never    Database administrator or Organizations: No    Attends Engineer, structural: More than 4 times per year    Marital Status: Divorced    Outpatient Medications Prior to Visit  Medication Sig Dispense Refill   CALCIUM CITRATE PO Take by mouth.     cetirizine (ZYRTEC) 10 MG tablet Take 10 mg by mouth daily.     cholecalciferol (VITAMIN D) 1000 units tablet Take 1,000 Units by mouth daily.     famotidine (PEPCID) 20 MG tablet Take 20 mg by mouth 2 (two) times daily.     Latanoprost 0.005 % EMUL Place into both eyes at bedtime.     meloxicam (MOBIC) 15 MG tablet TAKE 1 TABLET (15 MG TOTAL) BY MOUTH DAILY. 30 tablet 0   MULTIPLE VITAMIN PO Take by mouth.     Omega-3 Fatty Acids (OMEGA 3 PO) Take by mouth.     tiZANidine (ZANAFLEX) 4 MG tablet Take 1 tablet (4 mg total) by mouth at bedtime. (Patient taking differently: Take 4 mg by mouth. PRN) 90 tablet 1   TURMERIC PO Take by mouth.     No facility-administered medications prior to visit.     EXAM:  There were no vitals taken for this visit.  There is no height or weight on file to calculate BMI. Wt Readings from Last 3 Encounters:  05/16/23 125 lb (56.7 kg)  04/10/23 126 lb (57.2 kg)  02/27/23 125 lb (56.7 kg)    Physical Exam: Vital signs reviewed [id number] is a well-developed well-nourished alert cooperative    who appearsr stated age in no acute distress.  HEENT: normocephalic atraumatic , Eyes: PERRL EOM's full, conjunctiva clear, Nares: paten,t no deformity discharge or tenderness., Ears: no deformity EAC's clear TMs with normal landmarks. Mouth: clear OP, no lesions, edema.  Moist mucous membranes. Dentition in adequate repair. NECK: supple without  masses, thyromegaly or bruits. CHEST/PULM:  Clear to auscultation and percussion breath sounds equal no wheeze , rales or rhonchi. No chest wall deformities or tenderness. Breast: normal by inspection . No dimpling, discharge, masses, tenderness or discharge . CV: PMI is nondisplaced, S1 S2 no gallops, murmurs, rubs. Peripheral pulses are full without delay.No JVD .  ABDOMEN: Bowel sounds  normal nontender  No guard or rebound, no hepato splenomegal no CVA tenderness.  No hernia. Extremtities:  No clubbing cyanosis or edema, no acute joint swelling or redness no focal atrophy NEURO:  Oriented x3, cranial nerves 3-12 appear to be intact, no obvious focal weakness,gait within normal limits no abnormal reflexes or asymmetrical SKIN: No acute rashes normal turgor, color, no bruising or petechiae. PSYCH: Oriented, good eye contact, no obvious depression anxiety, cognition and judgment appear normal. LN: no cervical axillary inguinal adenopathy  Lab Results  Component Value Date   WBC 4.7 01/03/2022   HGB 13.3 01/03/2022   HCT 39.8 01/03/2022   PLT 270.0 01/03/2022   GLUCOSE 95 01/03/2022   CHOL 219 (H) 01/03/2022   TRIG 74.0 01/03/2022   HDL 69.20 01/03/2022   LDLDIRECT 137.3 07/02/2012   LDLCALC 135 (H) 01/03/2022   ALT 17 01/03/2022   AST 19 01/03/2022   NA 138 01/03/2022   K 4.3 01/03/2022   CL 103 01/03/2022   CREATININE 0.85 01/03/2022   BUN 20 01/03/2022   CO2 26 01/03/2022   TSH 1.53 11/16/2019   HGBA1C 6.0 01/03/2022   MICROALBUR 0.7 07/12/2015    BP Readings from Last 3 Encounters:  05/16/23 126/80  04/10/23 110/78  02/27/23 122/82    Lab results reviewed with patient   ASSESSMENT AND PLAN:  Discussed the following assessment and plan:    ICD-10-CM   1. Visit for preventive health examination  Z00.00     2. Medication management  Z79.899     3. Impaired fasting blood sugar  R73.01     4. Hyperlipidemia, mixed  E78.2      No follow-ups on file.  Patient  Care Team: Clydell Sposito, [name], MD as PCP - General (Internal Medicine) Campbell Stall, MD as Attending Physician (Dermatology) Elliot Cousin, OD (Optometry) There are no Patient Instructions on file for this visit.  [name]. Kessie Croston M.D.

## 2023-05-22 ENCOUNTER — Encounter: Payer: Self-pay | Admitting: Internal Medicine

## 2023-05-22 ENCOUNTER — Ambulatory Visit (INDEPENDENT_AMBULATORY_CARE_PROVIDER_SITE_OTHER): Payer: Medicare PPO | Admitting: Internal Medicine

## 2023-05-22 VITALS — BP 120/78 | HR 81 | Temp 98.4°F | Ht 63.5 in | Wt 122.6 lb

## 2023-05-22 DIAGNOSIS — E782 Mixed hyperlipidemia: Secondary | ICD-10-CM | POA: Diagnosis not present

## 2023-05-22 DIAGNOSIS — Z0000 Encounter for general adult medical examination without abnormal findings: Secondary | ICD-10-CM | POA: Diagnosis not present

## 2023-05-22 DIAGNOSIS — E559 Vitamin D deficiency, unspecified: Secondary | ICD-10-CM | POA: Diagnosis not present

## 2023-05-22 DIAGNOSIS — Z79899 Other long term (current) drug therapy: Secondary | ICD-10-CM | POA: Diagnosis not present

## 2023-05-22 DIAGNOSIS — R0981 Nasal congestion: Secondary | ICD-10-CM | POA: Diagnosis not present

## 2023-05-22 DIAGNOSIS — R7301 Impaired fasting glucose: Secondary | ICD-10-CM

## 2023-05-22 LAB — LIPID PANEL
Cholesterol: 207 mg/dL — ABNORMAL HIGH (ref 0–200)
HDL: 65.2 mg/dL (ref >39.00)
LDL Cholesterol: 126 mg/dL — ABNORMAL HIGH (ref 0–99)
NonHDL: 142.19
Total CHOL/HDL Ratio: 3
Triglycerides: 83 mg/dL (ref 0.0–149.0)
VLDL: 16.6 mg/dL (ref 0.0–40.0)

## 2023-05-22 LAB — CBC WITH DIFFERENTIAL/PLATELET
Basophils Absolute: 0 10*3/uL (ref 0.0–0.1)
Basophils Relative: 0.7 % (ref 0.0–3.0)
Eosinophils Absolute: 0.1 10*3/uL (ref 0.0–0.7)
Eosinophils Relative: 1.6 % (ref 0.0–5.0)
HCT: 40.4 % (ref 36.0–46.0)
Hemoglobin: 13.5 g/dL (ref 12.0–15.0)
Lymphocytes Relative: 26.9 % (ref 12.0–46.0)
Lymphs Abs: 1.4 10*3/uL (ref 0.7–4.0)
MCHC: 33.4 g/dL (ref 30.0–36.0)
MCV: 88.4 fL (ref 78.0–100.0)
Monocytes Absolute: 0.4 10*3/uL (ref 0.1–1.0)
Monocytes Relative: 6.6 % (ref 3.0–12.0)
Neutro Abs: 3.4 10*3/uL (ref 1.4–7.7)
Neutrophils Relative %: 64.2 % (ref 43.0–77.0)
Platelets: 315 10*3/uL (ref 150.0–400.0)
RBC: 4.57 Mil/uL (ref 3.87–5.11)
RDW: 14.1 % (ref 11.5–15.5)
WBC: 5.3 10*3/uL (ref 4.0–10.5)

## 2023-05-22 LAB — MICROALBUMIN / CREATININE URINE RATIO
Creatinine,U: 91.5 mg/dL
Microalb Creat Ratio: 0.8 mg/g (ref 0.0–30.0)
Microalb, Ur: <0.7 mg/dL (ref 0.0–1.9)

## 2023-05-22 LAB — COMPREHENSIVE METABOLIC PANEL
ALT: 19 U/L (ref 0–35)
AST: 21 U/L (ref 0–37)
Albumin: 4.4 g/dL (ref 3.5–5.2)
Alkaline Phosphatase: 80 U/L (ref 39–117)
BUN: 19 mg/dL (ref 6–23)
CO2: 26 meq/L (ref 19–32)
Calcium: 9.6 mg/dL (ref 8.4–10.5)
Chloride: 101 meq/L (ref 96–112)
Creatinine, Ser: 0.8 mg/dL (ref 0.40–1.20)
GFR: 71.58 mL/min (ref >60.00)
Glucose, Bld: 98 mg/dL (ref 70–99)
Potassium: 4.2 meq/L (ref 3.5–5.1)
Sodium: 136 meq/L (ref 135–145)
Total Bilirubin: 0.7 mg/dL (ref 0.2–1.2)
Total Protein: 6.8 g/dL (ref 6.0–8.3)

## 2023-05-22 LAB — VITAMIN D 25 HYDROXY (VIT D DEFICIENCY, FRACTURES): VITD: 53.13 ng/mL (ref 30.00–100.00)

## 2023-05-22 LAB — HEMOGLOBIN A1C: Hgb A1c MFr Bld: 6.2 % (ref 4.6–6.5)

## 2023-05-22 LAB — TSH: TSH: 1.65 u[IU]/mL (ref 0.35–5.50)

## 2023-05-22 NOTE — Patient Instructions (Addendum)
Good to see you today  Update ing labs. Continue lifestyle intervention healthy eating and exercise .   Will plan referral to ent  as discussed . In interim saline nose spray perhaps a different antihistamine trial.   Future  consider  see in allergist also .

## 2023-05-23 DIAGNOSIS — M542 Cervicalgia: Secondary | ICD-10-CM | POA: Diagnosis not present

## 2023-05-23 DIAGNOSIS — M25559 Pain in unspecified hip: Secondary | ICD-10-CM | POA: Diagnosis not present

## 2023-05-25 LAB — LIPOPROTEIN A (LPA): Lipoprotein (a): 49 nmol/L (ref <75)

## 2023-06-04 ENCOUNTER — Encounter: Payer: Self-pay | Admitting: Internal Medicine

## 2023-06-04 NOTE — Progress Notes (Signed)
Results stable   no diabetes   A1c in pre diabetic range .

## 2023-06-05 DIAGNOSIS — M542 Cervicalgia: Secondary | ICD-10-CM | POA: Diagnosis not present

## 2023-06-05 DIAGNOSIS — M25559 Pain in unspecified hip: Secondary | ICD-10-CM | POA: Diagnosis not present

## 2023-06-14 DIAGNOSIS — H401131 Primary open-angle glaucoma, bilateral, mild stage: Secondary | ICD-10-CM | POA: Diagnosis not present

## 2023-06-16 ENCOUNTER — Other Ambulatory Visit: Payer: Self-pay

## 2023-06-16 ENCOUNTER — Ambulatory Visit (HOSPITAL_COMMUNITY)
Admission: EM | Admit: 2023-06-16 | Discharge: 2023-06-16 | Disposition: A | Discharge status: Home / Self Care | Payer: Medicare PPO | Attending: Family Medicine | Admitting: Family Medicine

## 2023-06-16 ENCOUNTER — Emergency Department (HOSPITAL_COMMUNITY)
Admission: EM | Admit: 2023-06-16 | Discharge: 2023-06-16 | Disposition: A | Discharge status: Home / Self Care | Payer: Medicare PPO | Attending: Emergency Medicine | Admitting: Emergency Medicine

## 2023-06-16 ENCOUNTER — Emergency Department (HOSPITAL_COMMUNITY): Payer: Medicare PPO

## 2023-06-16 ENCOUNTER — Encounter (HOSPITAL_COMMUNITY): Payer: Self-pay

## 2023-06-16 DIAGNOSIS — W0110XA Fall on same level from slipping, tripping and stumbling with subsequent striking against unspecified object, initial encounter: Secondary | ICD-10-CM | POA: Insufficient documentation

## 2023-06-16 DIAGNOSIS — R030 Elevated blood-pressure reading, without diagnosis of hypertension: Secondary | ICD-10-CM | POA: Diagnosis not present

## 2023-06-16 DIAGNOSIS — S0992XA Unspecified injury of nose, initial encounter: Secondary | ICD-10-CM | POA: Diagnosis present

## 2023-06-16 DIAGNOSIS — S0990XA Unspecified injury of head, initial encounter: Secondary | ICD-10-CM

## 2023-06-16 DIAGNOSIS — S0083XA Contusion of other part of head, initial encounter: Secondary | ICD-10-CM

## 2023-06-16 DIAGNOSIS — J3489 Other specified disorders of nose and nasal sinuses: Secondary | ICD-10-CM

## 2023-06-16 DIAGNOSIS — E042 Nontoxic multinodular goiter: Secondary | ICD-10-CM | POA: Diagnosis not present

## 2023-06-16 DIAGNOSIS — W19XXXA Unspecified fall, initial encounter: Secondary | ICD-10-CM

## 2023-06-16 DIAGNOSIS — R519 Headache, unspecified: Secondary | ICD-10-CM | POA: Diagnosis not present

## 2023-06-16 DIAGNOSIS — S0993XA Unspecified injury of face, initial encounter: Secondary | ICD-10-CM | POA: Diagnosis not present

## 2023-06-16 DIAGNOSIS — S199XXA Unspecified injury of neck, initial encounter: Secondary | ICD-10-CM | POA: Diagnosis not present

## 2023-06-16 DIAGNOSIS — S0033XA Contusion of nose, initial encounter: Secondary | ICD-10-CM | POA: Insufficient documentation

## 2023-06-16 MED ORDER — OXYCODONE-ACETAMINOPHEN 5-325 MG PO TABS: 1.0000 | ORAL_TABLET | Freq: Four times a day (QID) | ORAL | 0 refills | Status: DC | PRN

## 2023-06-16 NOTE — ED Provider Notes (Signed)
 [facility]    CSN: [id number] Arrival date & time: 06/16/23  1015      History   Chief Complaint Chief Complaint  Patient presents with   Fall   Facial Injury    HPI Tina [name] is a 78 y.o. female.   The history is provided by the patient. No language interpreter was used.  Fall This is a new problem.  Facial Injury Mtal brace, tripped over last night and fell flat on her face. No LOC. She has mild facial pain and soreness. She denies headache, but head hurts here and there.   Past Medical History:  Diagnosis Date   High triglycerides    Hx of varicella     Patient Active Problem List   Diagnosis Date Noted   Arthritis of first metatarsophalangeal (MTP) joint of left foot 03/01/2022   Left-sided back pain 11/02/2021   Mild stage glaucoma 07/07/2020   Patellofemoral syndrome of both knees 04/21/2020   Greater trochanteric bursitis of left hip 11/02/2019   Nonallopathic lesion of lumbar region 08/06/2019   Nonallopathic lesion of sacral region 08/06/2019   CMC arthritis, thumb, degenerative 06/01/2015   Neck pain on left side 04/29/2015   Nonallopathic lesion of cervical region 04/29/2015   Nonallopathic lesion of thoracic region 04/29/2015   Nonallopathic lesion-rib cage 04/29/2015   Metatarsalgia of both feet 04/01/2015   Visit for preventive health examination 07/21/2013   Family history of diabetes mellitus 07/21/2013   Skin lesion nose 07/07/2012   Nonspecific ST-T wave electrocardiographic changes 07/07/2011   Encounter for routine gynecological examination 07/06/2011   Hand joint pain 11/18/2010   Hyperlipidemia, mixed 06/11/2010   Preventative health care 06/11/2010   Dry eyes 06/11/2010   Allergic rhinitis, seasonal 06/05/2010   Osteopenia 06/05/2010   Impaired fasting blood sugar 06/05/2010    Past Surgical History:  Procedure Laterality Date   CHOLECYSTECTOMY      OB History   No obstetric history on file.      Home  Medications    Prior to Admission medications   Medication Sig Start Date End Date Taking? Authorizing Provider  CALCIUM CITRATE PO Take by mouth.    [provider]  cetirizine (ZYRTEC) 10 MG tablet Take 10 mg by mouth daily.    [provider]  cholecalciferol (VITAMIN D) 1000 units tablet Take 1,000 Units by mouth daily.    [provider]  famotidine (PEPCID) 20 MG tablet Take 20 mg by mouth 2 (two) times daily.    [provider]  Latanoprost 0.005 % EMUL Place into both eyes at bedtime. 06/16/20   [provider]  MULTIPLE VITAMIN PO Take by mouth.    [provider]  Omega-3 Fatty Acids (OMEGA 3 PO) Take by mouth.    [provider]  tiZANidine (ZANAFLEX) 4 MG tablet Take 1 tablet (4 mg total) by mouth at bedtime. Patient taking differently: Take 4 mg by mouth. PRN 11/10/20   [name], DO  TURMERIC PO Take by mouth.    [provider]    Family History Family History  Problem Relation Age of Onset   Hypertension Mother    Hyperlipidemia Mother    Stroke Mother    Diabetes Father    Miscarriages / [place] Brother     Social History Social History   Tobacco Use   Smoking status: Former    Current packs/day: 0.70    Average packs/day: 0.7 packs/day for 4.0 years (2.8 ttl  pk-yrs)    Types: Cigarettes   Smokeless tobacco: Never   Tobacco comments:    in her 20's   Vaping Use   Vaping status: Never Used  Substance Use Topics   Alcohol use: Yes    Alcohol/week: 1.0 standard drink of alcohol    Types: 1 Standard drinks or equivalent per week    Comment: ocassionally   Drug use: No     Allergies   Patient has no known allergies.   Review of Systems Review of Systems  Constitutional: Negative.   Eyes:  Negative for photophobia, pain, redness and visual disturbance.  All other systems reviewed and are negative.    Physical Exam Triage Vital Signs ED Triage Vitals [06/16/23 1045]   Encounter Vitals Group     BP (!) 163/96     Systolic BP Percentile      Diastolic BP Percentile      Pulse Rate 69     Resp 14     Temp 98.3 F (36.8 C)     Temp Source Oral     SpO2 98 %     Weight      Height      Head Circumference      Peak Flow      Pain Score 3     Pain Loc      Pain Education      Exclude from Growth Chart    No data found.  Updated Vital Signs BP (!) 169/85   Pulse 69   Temp 98.3 F (36.8 C) (Oral)   Resp 14   SpO2 98%   Visual Acuity Right Eye Distance:   Left Eye Distance:   Bilateral Distance:    Right Eye Near:   Left Eye Near:    Bilateral Near:     Physical Exam Vitals and nursing note reviewed.  HENT:     Head: Normocephalic.     Comments: No bruising of the back of her ears Partial periocular erythema and bruising with mild swelling and tenderness of her nasal bridge.    Right Ear: Tympanic membrane, ear canal and external ear normal.     Left Ear: Tympanic membrane and ear canal normal.  Eyes:     Comments: Mildly injected conjunctiva, no vision loss per the patient  Cardiovascular:     Rate and Rhythm: Normal rate and regular rhythm.     Heart sounds: No murmur heard. Pulmonary:     Effort: Pulmonary effort is normal. No respiratory distress.     Breath sounds: Normal breath sounds. No wheezing.  Neurological:     General: No focal deficit present.     Mental Status: She is alert and oriented to person, place, and time.     Cranial Nerves: Cranial nerves 2-12 are intact.     Sensory: Sensation is intact.     Motor: Motor function is intact.     Coordination: Coordination is intact.     Gait: Gait is intact.     Deep Tendon Reflexes: Reflexes are normal and symmetric.      UC Treatments / Results  Labs (all labs ordered are listed, but only abnormal results are displayed) Labs Reviewed - No data to display  EKG   Radiology No results found.  Procedures Procedures (including critical care  time)  Medications Ordered in UC Medications - No data to display  Initial Impression / Assessment and Plan / UC Course  I have reviewed the triage vital  signs and the nursing notes.  Pertinent labs & imaging results that were available during my care of the patient were reviewed by me and considered in my medical decision making (see chart for details).  Clinical Course as of 06/16/23 1142  Sun Jun 16, 2023  1140 Facial injury Partial Raccoon sign Concern for basal skull fracture No neurologic deficit on exam CT head recommended She will head on to the ED from now She is stable at baseline [KE]  1141 Elevated BP Likely due to anxiety or pain PCP f/u recommended [KE]    Clinical Course User Index [KE] Doreene Eland, MD    Final Clinical Impressions(s) / UC Diagnoses   Final diagnoses:  Contusion of face, initial encounter  Injury of head, initial encounter  Elevated blood pressure reading     Discharge Instructions      It was nice seeing you. I am sorry about your fall. Your facial lesion is similar to what we would call a raccoon sign, which could indicate a fracture of the base of the skull. Xray would not show [place] a lot of information. Hence, I would recommend ED evaluation with CT of your head. See your PCP soon for HTN.      ED Prescriptions   None    PDMP not reviewed this encounter.   Doreene Eland, MD 06/16/23 [phone]

## 2023-06-16 NOTE — ED Notes (Signed)
 Patient is being discharged from the Urgent Care and sent to the Emergency Department via pov . Per Dr Lum Babe, patient is in need of higher level of care due to limited resources, fall, facial trauma and on blood thinners. Patient is aware and verbalizes understanding of plan of care.  Vitals:   06/16/23 1045 06/16/23 1123  BP: (!) 163/96 (!) 169/85  Pulse: 69   Resp: 14   Temp: 98.3 F (36.8 C)   SpO2: 98%

## 2023-06-16 NOTE — ED Triage Notes (Signed)
 Patient had mechanical fall last night and fell face forward onto concrete. No loc. Bruising noted around orbits and swelling to nose. No nausea, no vomiting, no dizziness. Sent from [facility] for possible CT face.

## 2023-06-16 NOTE — Discharge Instructions (Signed)
 It was nice seeing you. I am sorry about your fall. Your facial lesion is similar to what we would call a raccoon sign, which could indicate a fracture of the base of the skull. Xray would not show [place] a lot of information. Hence, I would recommend ED evaluation with CT of your head. See your PCP soon for HTN.

## 2023-06-16 NOTE — ED Provider Triage Note (Signed)
 Emergency Medicine Provider Triage Evaluation Note  Adreonna Yontz , a 78 y.o. female  was evaluated in triage.  Pt complains of face trauma.  Review of Systems  Positive:  Negative:   Physical Exam  BP (!) 185/72   Pulse 74   Temp 98.5 F (36.9 C)   Resp 14   SpO2 99%  Gen:   Awake, no distress   Resp:  Normal effort  MSK:   Moves extremities without difficulty  Other:    Medical Decision Making  Medically screening exam initiated at 12:14 PM.  Appropriate orders placed.  Breleigh Carpino was informed that the remainder of the evaluation will be completed by another provider, this initial triage assessment does not replace that evaluation, and the importance of remaining in the ED until their evaluation is complete.  Tripped and fell onto concrete yesterday. No blood thinners, LOC, seizures. UC sent patient over here for head imaging given the amount of bruising on her face. Patient stating that she feels fine. No vision changes.   Dorthy Cooler, [place] 06/16/23 1215

## 2023-06-16 NOTE — ED Triage Notes (Signed)
 Patient states she fell from tripping last night while walking and landed on her face. Patient denies blood thinners.  Patient c/o swelling to her nose and bruising to both eyes.

## 2023-06-16 NOTE — Discharge Instructions (Signed)
 You were seen in the emergency department today for concerns of a fall.  He had CT imaging on your head, neck, and facial bones to assess any possible injuries.  There is no obvious traumatic findings with there are some concern for possible minimally displaced nasal bone fracture.  Given that you are not in any severe pain, I doubtful of this and suspect you likely have just bruising contusions the area.  As you are planning on following up with ENT for your congestion, I would reach out to the ENT office and try to move your appointment up for ER follow-up.  For any concerns of new or worsening symptoms, return the emergency department.

## 2023-06-16 NOTE — ED Provider Notes (Signed)
 [place] EMERGENCY DEPARTMENT AT [facility] Provider Note   CSN: [id number] Arrival date & time: 06/16/23  1144     History No chief complaint on file.   Tina [name] is a 78 y.o. female.  Patient presents to the emergency department concerns of a fall and facial injury.  She reports that she [place] at [place] and she tripped over a metal pole on the ground causing her to land face first on the ground.  She denies syncope or loss of consciousness.  No nausea, vomiting or headaches afterwards.  Not on blood thinners.  Since the fall, has noted some bruising to the bridge of the nose as well as underneath both orbits.  No visual impairments.  HPI     Home Medications Prior to Admission medications   Medication Sig Start Date End Date Taking? Authorizing Provider  oxyCODONE-acetaminophen (PERCOCET/ROXICET) 5-325 MG tablet Take 1 tablet by mouth every 6 (six) hours as needed for severe pain (pain score 7-10). 06/16/23  Yes Eleanore Junio A, PA-C  CALCIUM CITRATE PO Take by mouth.    [provider]  cetirizine (ZYRTEC) 10 MG tablet Take 10 mg by mouth daily.    [provider]  cholecalciferol (VITAMIN D) 1000 units tablet Take 1,000 Units by mouth daily.    [provider]  famotidine (PEPCID) 20 MG tablet Take 20 mg by mouth 2 (two) times daily.    [provider]  Latanoprost 0.005 % EMUL Place into both eyes at bedtime. 06/16/20   [provider]  MULTIPLE VITAMIN PO Take by mouth.    [provider]  Omega-3 Fatty Acids (OMEGA 3 PO) Take by mouth.    [provider]  tiZANidine (ZANAFLEX) 4 MG tablet Take 1 tablet (4 mg total) by mouth at bedtime. Patient taking differently: Take 4 mg by mouth. PRN 11/10/20   [name], DO  TURMERIC PO Take by mouth.    [provider]      Allergies    Patient has no known allergies.    Review of Systems   Review of Systems   Skin:  Positive for color change.  All other systems reviewed and are negative.   Physical Exam Updated Vital Signs BP (!) 182/67 (BP Location: Right Arm)   Pulse 67   Temp 98.5 F (36.9 C) (Oral)   Resp 17   SpO2 100%  Physical Exam Vitals and nursing note reviewed.  Constitutional:      General: She is not in acute distress.    Appearance: She is well-developed.  HENT:     Head: Normocephalic and atraumatic.     Nose: Signs of injury present. No septal deviation or laceration.     Right Nostril: No epistaxis or septal hematoma.     Left Nostril: No epistaxis or septal hematoma.  Eyes:     Conjunctiva/sclera: Conjunctivae normal.  Cardiovascular:     Rate and Rhythm: Normal rate and regular rhythm.     Heart sounds: No murmur heard. Pulmonary:     Effort: Pulmonary effort is normal. No respiratory distress.     Breath sounds: Normal breath sounds.  Abdominal:     Palpations: Abdomen is soft.     Tenderness: There is no abdominal tenderness.  Musculoskeletal:        General: No swelling.     Cervical back: Neck supple.  Skin:    General: Skin is warm and  dry.     Capillary Refill: Capillary refill takes less than 2 seconds.  Neurological:     Mental Status: She is alert.  Psychiatric:        Mood and Affect: Mood normal.     ED Results / Procedures / Treatments   Labs (all labs ordered are listed, but only abnormal results are displayed) Labs Reviewed - No data to display  EKG None  Radiology CT Head Wo Contrast Result Date: 06/16/2023 CLINICAL DATA:  Head trauma, minor (Age >= 65y); Neck trauma (Age >= 65y); Facial trauma, blunt EXAM: CT HEAD WITHOUT CONTRAST CT MAXILLOFACIAL WITHOUT CONTRAST CT CERVICAL SPINE WITHOUT CONTRAST TECHNIQUE: Multidetector CT imaging of the head, cervical spine, and maxillofacial structures were performed using the standard protocol without intravenous contrast. Multiplanar CT image reconstructions of the cervical spine and  maxillofacial structures were also generated. RADIATION DOSE REDUCTION: This exam was performed according to the departmental dose-optimization program which includes automated exposure control, adjustment of the mA and/or kV according to patient size and/or use of iterative reconstruction technique. COMPARISON:  None Available. FINDINGS: CT HEAD FINDINGS Brain: No hemorrhage. No hydrocephalus. No extra-axial fluid collection. No mass effect. No mass lesion. No CT evidence of an acute cortical infarct Vascular: No hyperdense vessel or unexpected calcification. Skull: Normal. Negative for fracture or focal lesion. Other: None. CT MAXILLOFACIAL FINDINGS Osseous: Possible minimally displaced fracture of the nasal bone on the right (series 4, image 14). Mild soft tissue swelling around the nasal bones. Orbits: Negative. No traumatic or inflammatory finding. Sinuses: No middle ear or mastoid effusion. Mild mucosal thickening of the floor of bilateral maxillary sinuses. Soft tissues: Mild soft tissue swelling nasal bones and in the infraorbital soft tissues bilaterally. CT CERVICAL SPINE FINDINGS Alignment: Grade 1 anterolisthesis of C3 on C4 and C4 on C5. Skull base and vertebrae: No acute fracture. No primary bone lesion or focal pathologic process. Soft tissues and spinal canal: No prevertebral fluid or swelling. No visible canal hematoma. Disc levels:  No evidence of high-grade spinal canal stenosis Upper chest: Negative Other: Multinodular thyroid gland with the largest nodule measuring up to 1.4 cm IMPRESSION: 1. No CT evidence of intracranial injury. 2. Possible minimally displaced fracture of the nasal bone on the right. Mild soft tissue swelling around the nasal bones and in the infraorbital soft tissues bilaterally. 3. No acute fracture or traumatic subluxation of the cervical spine. Electronically Signed   By: Lorenza Cambridge M.D.   On: 06/16/2023 13:29   CT Cervical Spine Wo Contrast Result Date:  06/16/2023 CLINICAL DATA:  Head trauma, minor (Age >= 65y); Neck trauma (Age >= 65y); Facial trauma, blunt EXAM: CT HEAD WITHOUT CONTRAST CT MAXILLOFACIAL WITHOUT CONTRAST CT CERVICAL SPINE WITHOUT CONTRAST TECHNIQUE: Multidetector CT imaging of the head, cervical spine, and maxillofacial structures were performed using the standard protocol without intravenous contrast. Multiplanar CT image reconstructions of the cervical spine and maxillofacial structures were also generated. RADIATION DOSE REDUCTION: This exam was performed according to the departmental dose-optimization program which includes automated exposure control, adjustment of the mA and/or kV according to patient size and/or use of iterative reconstruction technique. COMPARISON:  None Available. FINDINGS: CT HEAD FINDINGS Brain: No hemorrhage. No hydrocephalus. No extra-axial fluid collection. No mass effect. No mass lesion. No CT evidence of an acute cortical infarct Vascular: No hyperdense vessel or unexpected calcification. Skull: Normal. Negative for fracture or focal lesion. Other: None. CT MAXILLOFACIAL FINDINGS Osseous: Possible minimally displaced fracture of the nasal bone on  the right (series 4, image 14). Mild soft tissue swelling around the nasal bones. Orbits: Negative. No traumatic or inflammatory finding. Sinuses: No middle ear or mastoid effusion. Mild mucosal thickening of the floor of bilateral maxillary sinuses. Soft tissues: Mild soft tissue swelling nasal bones and in the infraorbital soft tissues bilaterally. CT CERVICAL SPINE FINDINGS Alignment: Grade 1 anterolisthesis of C3 on C4 and C4 on C5. Skull base and vertebrae: No acute fracture. No primary bone lesion or focal pathologic process. Soft tissues and spinal canal: No prevertebral fluid or swelling. No visible canal hematoma. Disc levels:  No evidence of high-grade spinal canal stenosis Upper chest: Negative Other: Multinodular thyroid gland with the largest nodule measuring  up to 1.4 cm IMPRESSION: 1. No CT evidence of intracranial injury. 2. Possible minimally displaced fracture of the nasal bone on the right. Mild soft tissue swelling around the nasal bones and in the infraorbital soft tissues bilaterally. 3. No acute fracture or traumatic subluxation of the cervical spine. Electronically Signed   By: Lorenza Cambridge M.D.   On: 06/16/2023 13:29   CT Maxillofacial WO CM Result Date: 06/16/2023 CLINICAL DATA:  Head trauma, minor (Age >= 65y); Neck trauma (Age >= 65y); Facial trauma, blunt EXAM: CT HEAD WITHOUT CONTRAST CT MAXILLOFACIAL WITHOUT CONTRAST CT CERVICAL SPINE WITHOUT CONTRAST TECHNIQUE: Multidetector CT imaging of the head, cervical spine, and maxillofacial structures were performed using the standard protocol without intravenous contrast. Multiplanar CT image reconstructions of the cervical spine and maxillofacial structures were also generated. RADIATION DOSE REDUCTION: This exam was performed according to the departmental dose-optimization program which includes automated exposure control, adjustment of the mA and/or kV according to patient size and/or use of iterative reconstruction technique. COMPARISON:  None Available. FINDINGS: CT HEAD FINDINGS Brain: No hemorrhage. No hydrocephalus. No extra-axial fluid collection. No mass effect. No mass lesion. No CT evidence of an acute cortical infarct Vascular: No hyperdense vessel or unexpected calcification. Skull: Normal. Negative for fracture or focal lesion. Other: None. CT MAXILLOFACIAL FINDINGS Osseous: Possible minimally displaced fracture of the nasal bone on the right (series 4, image 14). Mild soft tissue swelling around the nasal bones. Orbits: Negative. No traumatic or inflammatory finding. Sinuses: No middle ear or mastoid effusion. Mild mucosal thickening of the floor of bilateral maxillary sinuses. Soft tissues: Mild soft tissue swelling nasal bones and in the infraorbital soft tissues bilaterally. CT CERVICAL  SPINE FINDINGS Alignment: Grade 1 anterolisthesis of C3 on C4 and C4 on C5. Skull base and vertebrae: No acute fracture. No primary bone lesion or focal pathologic process. Soft tissues and spinal canal: No prevertebral fluid or swelling. No visible canal hematoma. Disc levels:  No evidence of high-grade spinal canal stenosis Upper chest: Negative Other: Multinodular thyroid gland with the largest nodule measuring up to 1.4 cm IMPRESSION: 1. No CT evidence of intracranial injury. 2. Possible minimally displaced fracture of the nasal bone on the right. Mild soft tissue swelling around the nasal bones and in the infraorbital soft tissues bilaterally. 3. No acute fracture or traumatic subluxation of the cervical spine. Electronically Signed   By: Lorenza Cambridge M.D.   On: 06/16/2023 13:29    Procedures Procedures    Medications Ordered in ED Medications - No data to display  ED Course/ Medical Decision Making/ A&P                                 Medical Decision Making  This patient presents to the ED for concern of fall, facial injury. Differential diagnosis includes nasal fracture, septal hematoma, orbital wall fracture, SAH   Imaging Studies ordered:  I ordered imaging studies including CT head, CT cervical spine, CT maxillofacial I independently visualized and interpreted imaging which showed No CT evidence of intracranial injury. 2. Possible minimally displaced fracture of the nasal bone on the right. Mild soft tissue swelling around the nasal bones and in the infraorbital soft tissues bilaterally. 3. No acute fracture or traumatic subluxation of the cervical spine. I agree with the radiologist interpretation   Problem List / ED Course:  Patient here to the ED with concerns of a mechanical fall. [place] a metal pole, landing face first into the pavement. No LOC, but states that the fall "hurt my pride". She denies any headache, vision  changes, nausea, or vomiting following the fall. She does have tenderness and pain to palpation towards the left lower orbit and nasal bridge. No epistaxis. She was briefly seen at urgent care and advised to come to the ED for evaluation given mechanism and bruising. No other area of pain or discomfort. Physical exam is large unremarkable.  No septal hematoma. CT imaging ordered from triage and pending. All CT imaging is negative.  Patient is otherwise well-appearing with minimal bruising towards the bridge of the nose and under both orbits.  No changes in vision.  Patient otherwise stable at this time and no evidence of septal hematoma.  Patient is planning on following up with Dr. Irene Pap with ENT for congestion and will reach out for ED follow up with this injury.  Discussed return precautions such as new or worsening symptoms.  A prescription for Percocet was sent for further pain management.  Advised patient take Tylenol or ibuprofen as needed for more minimal pain.  Patient discharged home in stable condition.   Final Clinical Impression(s) / ED Diagnoses Final diagnoses:  Fall, initial encounter  Nasal pain    Rx / DC Orders ED Discharge Orders          Ordered    oxyCODONE-acetaminophen (PERCOCET/ROXICET) 5-325 MG tablet  Every 6 hours PRN        06/16/23 1446              Salomon Mast 06/16/23 1532    Gwyneth Sprout, MD 06/20/23 [phone]

## 2023-06-17 ENCOUNTER — Telehealth (INDEPENDENT_AMBULATORY_CARE_PROVIDER_SITE_OTHER): Payer: Self-pay

## 2023-06-17 NOTE — Telephone Encounter (Signed)
 ED is requesting a date before march 7th

## 2023-06-18 DIAGNOSIS — M542 Cervicalgia: Secondary | ICD-10-CM | POA: Diagnosis not present

## 2023-06-18 DIAGNOSIS — M25559 Pain in unspecified hip: Secondary | ICD-10-CM | POA: Diagnosis not present

## 2023-06-26 NOTE — Progress Notes (Unsigned)
 [name] [address] [place] [address] Phone: [phone] Subjective:   Tina [name], am serving as a scribe for Dr. Antoine Primas.  I'm seeing this patient by the request  of:  Panosh, [name], MD  CC: Back and neck pain follow-up    [id number]  Tina [name] is a 78 y.o. female coming in with complaint of back and neck pain. OMT 05/16/2023. Patient seen in ED in February for a fall. Patient states wrist pain after fall. Feeling better, but R is worse than L.  Medications patient has been prescribed:   Taking:  CT cervical 06/16/2023 IMPRESSION: 1. No CT evidence of intracranial injury. 2. Possible minimally displaced fracture of the nasal bone on the right. Mild soft tissue swelling around the nasal bones and in the infraorbital soft tissues bilaterally. 3. No acute fracture or traumatic subluxation of the cervical spine.         Reviewed prior external information including notes and imaging from previsou exam, outside providers and external EMR if available.   As well as notes that were available from care everywhere and other healthcare systems.  Past medical history, social, surgical and family history all reviewed in electronic medical record.  No pertanent information unless stated regarding to the chief complaint.   Past Medical History:  Diagnosis Date   High triglycerides    Hx of varicella     No Known Allergies   Review of Systems:  No headache, visual changes, nausea, vomiting, diarrhea, constipation, dizziness, abdominal pain, skin rash, fevers, chills, night sweats, weight loss, swollen lymph nodes, body aches, joint swelling, chest pain, shortness of breath, mood changes. POSITIVE muscle aches  Objective  Blood pressure 132/84, pulse (!) 57, height 5\' 3"  (1.6 m), weight 125 lb (56.7 kg), SpO2 99%.   General: No apparent distress alert and oriented x3 mood and affect normal, dressed appropriately.   HEENT: Pupils equal, extraocular movements intact bruising underneath the eyes.  No significant pain on the maxillary area.  Still some mild swelling over the bridge of the nose Respiratory: Patient's speak in full sentences and does not appear short of breath  Cardiovascular: No lower extremity edema, non tender, no erythema  Gait MSK:  Back does have some loss lordosis noted.  Some tenderness to palpation in the paraspinal musculature.  Neck exam more tightness on the left than the right.  Osteopathic findings  C6 flexed rotated and side bent left T3 extended rotated and side bent left inhaled rib L3 flexed rotated and side bent right Sacrum right on right    Assessment and Plan:  CMC arthritis, thumb, degenerative Known arthritic changes but no significant swelling.  Do feel that this is more of a sprain from previous fall.  Patient does have good range of motion at the moment.  No significant bruising noted.  UCL is intact.  Discussed icing regimen and home exercises.  Discussed which activities to do and which ones to avoid.  Discussed padding gloves with lifting and avoiding direct impact or holding body weight.  Follow-up again in 6 to 8 weeks otherwise.    Nonallopathic problems  Decision today to treat with OMT was based on Physical Exam  After verbal consent patient was treated with HVLA, ME, FPR techniques in cervical, rib, thoracic, lumbar, and sacral  areas  Patient tolerated the procedure well with improvement in symptoms  Patient given exercises, stretches and lifestyle modifications  See medications in patient instructions if  given  Patient will follow up in 4-8 weeks    The above documentation has been reviewed and is accurate and complete [name], DO           Note: This dictation was prepared with Dragon dictation along with smaller phrase technology. Any transcriptional errors that result from this process are unintentional.

## 2023-06-27 ENCOUNTER — Encounter: Payer: Self-pay | Admitting: Family Medicine

## 2023-06-27 ENCOUNTER — Ambulatory Visit: Payer: Medicare PPO | Admitting: Family Medicine

## 2023-06-27 VITALS — BP 132/84 | HR 57 | Ht 63.0 in | Wt 125.0 lb

## 2023-06-27 DIAGNOSIS — M542 Cervicalgia: Secondary | ICD-10-CM

## 2023-06-27 DIAGNOSIS — M9903 Segmental and somatic dysfunction of lumbar region: Secondary | ICD-10-CM | POA: Diagnosis not present

## 2023-06-27 DIAGNOSIS — M1811 Unilateral primary osteoarthritis of first carpometacarpal joint, right hand: Secondary | ICD-10-CM | POA: Diagnosis not present

## 2023-06-27 DIAGNOSIS — M9902 Segmental and somatic dysfunction of thoracic region: Secondary | ICD-10-CM

## 2023-06-27 DIAGNOSIS — M9908 Segmental and somatic dysfunction of rib cage: Secondary | ICD-10-CM | POA: Diagnosis not present

## 2023-06-27 DIAGNOSIS — M9904 Segmental and somatic dysfunction of sacral region: Secondary | ICD-10-CM | POA: Diagnosis not present

## 2023-06-27 DIAGNOSIS — M25559 Pain in unspecified hip: Secondary | ICD-10-CM | POA: Diagnosis not present

## 2023-06-27 DIAGNOSIS — M9901 Segmental and somatic dysfunction of cervical region: Secondary | ICD-10-CM

## 2023-06-27 NOTE — Patient Instructions (Signed)
 Good to see you! Arnica lotion to hands Let's stay vertical See you again in 7 weeks

## 2023-06-27 NOTE — Assessment & Plan Note (Signed)
 Known arthritic changes but no significant swelling.  Do feel that this is more of a sprain from previous fall.  Patient does have good range of motion at the moment.  No significant bruising noted.  UCL is intact.  Discussed icing regimen and home exercises.  Discussed which activities to do and which ones to avoid.  Discussed padding gloves with lifting and avoiding direct impact or holding body weight.  Follow-up again in 6 to 8 weeks otherwise.

## 2023-06-27 NOTE — Assessment & Plan Note (Signed)
 Mild exacerbation likely from patient's fall and some mild whiplash but seems to be improving.  Likely no significant radicular symptoms.  Discussed icing regimen and home exercises, which activities to do and which ones to avoid.  Increase activity slowly.  Follow-up again in 6 to 8 weeks otherwise.

## 2023-06-28 ENCOUNTER — Encounter (INDEPENDENT_AMBULATORY_CARE_PROVIDER_SITE_OTHER): Payer: Self-pay | Admitting: Otolaryngology

## 2023-06-28 ENCOUNTER — Ambulatory Visit (INDEPENDENT_AMBULATORY_CARE_PROVIDER_SITE_OTHER): Payer: Medicare PPO | Admitting: Otolaryngology

## 2023-06-28 VITALS — BP 168/91 | HR 69 | Ht 63.5 in | Wt 125.0 lb

## 2023-06-28 DIAGNOSIS — R0989 Other specified symptoms and signs involving the circulatory and respiratory systems: Secondary | ICD-10-CM

## 2023-06-28 DIAGNOSIS — E042 Nontoxic multinodular goiter: Secondary | ICD-10-CM

## 2023-06-28 DIAGNOSIS — J3089 Other allergic rhinitis: Secondary | ICD-10-CM

## 2023-06-28 DIAGNOSIS — J3489 Other specified disorders of nose and nasal sinuses: Secondary | ICD-10-CM

## 2023-06-28 DIAGNOSIS — S022XXA Fracture of nasal bones, initial encounter for closed fracture: Secondary | ICD-10-CM | POA: Diagnosis not present

## 2023-06-28 DIAGNOSIS — R0982 Postnasal drip: Secondary | ICD-10-CM

## 2023-06-28 DIAGNOSIS — R0981 Nasal congestion: Secondary | ICD-10-CM

## 2023-06-28 DIAGNOSIS — J342 Deviated nasal septum: Secondary | ICD-10-CM

## 2023-06-28 DIAGNOSIS — K219 Gastro-esophageal reflux disease without esophagitis: Secondary | ICD-10-CM

## 2023-06-28 DIAGNOSIS — J343 Hypertrophy of nasal turbinates: Secondary | ICD-10-CM | POA: Diagnosis not present

## 2023-06-28 DIAGNOSIS — E041 Nontoxic single thyroid nodule: Secondary | ICD-10-CM

## 2023-06-28 DIAGNOSIS — J300 Vasomotor rhinitis: Secondary | ICD-10-CM

## 2023-06-28 MED ORDER — AZELASTINE HCL 0.1 % NA SOLN: 2.0000 | Freq: Two times a day (BID) | NASAL | 12 refills | Status: AC

## 2023-06-28 MED ORDER — CETIRIZINE HCL 10 MG PO TABS: 10.0000 mg | ORAL_TABLET | Freq: Every day | ORAL | 11 refills | Status: AC

## 2023-06-28 MED ORDER — IPRATROPIUM BROMIDE 0.03 % NA SOLN: 2.0000 | Freq: Two times a day (BID) | NASAL | 12 refills | Status: DC

## 2023-06-28 NOTE — Progress Notes (Signed)
 ENT CONSULT:  Reason for Consult: chronic nasal congestion R > L and    HPI: Discussed the use of AI scribe software for clinical note transcription with the patient, who gave verbal consent to proceed.  History of Present Illness   78 yoF presents with chronic nasal congestion, particularly on the right side and hx of recent fall and nasal/facial trauma/evidence of nasal fx on CT max/face done in ED. She was referred by her primary doctor to rule out nasal polyps or a deviated septum initially and then had a recent fall and sustained nasal bone fx.   She experiences significant nasal congestion, especially on the right side, which has persisted for several years. She notes difficulty with swabbing on the right side compared to the left when doing COVID-19 tests. A few weeks ago, she experienced nasal trauma after tripping over a metal bar, resulting in a 'raccoon' appearance the following morning. She visited urgent care and was subsequently sent to the ER, where a CT scan was performed, showing a possible slight fracture on the right side.A CT max/face scan showed no polyps but evidence of deviated septum on the right and non-displaced nasal bone fx.  She experiences persistent nasal congestion and takes Zyrtec regularly. She feels like she has allergies all the time and is reluctant to use Flonase due to her elevated eye pressure and family history of glaucoma. She takes latanoprost every night to manage her eye pressure, which remains within the normal range.  She reports frequent throat clearing and a sensation of needing to clear her throat. She has been told she has reflux and takes Gaviscon, which she orders from [employer].  She has a history of thyroid nodules, which she has been aware of for most of her adult life. Her thyroid function tests have always been within the normal range. She has not had a thyroid ultrasound before.     Records Reviewed:  ED note 06/16/23 78 y.o. female.  Patient  presents to the emergency department concerns of a fall and facial injury.  She reports that [place] and she [place]pped over a metal pole on the ground causing her to land face first on the ground.  She denies syncope or loss of consciousness.  No nausea, vomiting or headaches afterwards.  Not on blood thinners.  Since the fall, has noted some bruising to the bridge of the nose as well as underneath both orbits.  No visual impairments   Patient here to the ED with concerns of a mechanical fall. [place] and [place] a metal pole, landing face first into the pavement. No LOC, but states that the fall "hurt my pride". She denies any headache, vision changes, nausea, or vomiting following the fall. She does have tenderness and pain to palpation towards the left lower orbit and nasal bridge. No epistaxis. She was briefly seen at urgent care and advised to come to the ED for evaluation given mechanism and bruising. No other area of pain or discomfort. Physical exam is large unremarkable.  No septal hematoma. CT imaging ordered from triage and pending. All CT imaging is negative.  Patient is otherwise well-appearing with minimal bruising towards the bridge of the nose and under both orbits.  No changes in vision.  Patient otherwise stable at this time and no evidence of septal hematoma.  Patient is planning on following up with Dr. Irene Pap with ENT for  congestion and will reach out for ED follow up with this injury.  Discussed return precautions such as new or worsening symptoms.  A prescription for Percocet was sent for further pain management.  Advised patient take Tylenol or ibuprofen as needed for more minimal pain.  Patient discharged home in stable condition   Past Medical History:  Diagnosis Date   High triglycerides    Hx of varicella     Past Surgical History:  Procedure Laterality Date   CHOLECYSTECTOMY      Family  History  Problem Relation Age of Onset   Hypertension Mother    Hyperlipidemia Mother    Stroke Mother    Diabetes Father    Miscarriages / [place] Brother     Social History:  reports that she has quit smoking. Her smoking use included cigarettes. She has a 2.8 pack-year smoking history. She has never used smokeless tobacco. She reports current alcohol use of about 1.0 standard drink of alcohol per week. She reports that she does not use drugs.  Allergies: No Known Allergies  Medications: I have reviewed the patient's current medications.  The PMH, PSH, Medications, Allergies, and SH were reviewed and updated.  ROS: Constitutional: Negative for fever, weight loss and weight gain. Cardiovascular: Negative for chest pain and dyspnea on exertion. Respiratory: Is not experiencing shortness of breath at rest. Gastrointestinal: Negative for nausea and vomiting. Neurological: Negative for headaches. Psychiatric: The patient is not nervous/anxious  Blood pressure (!) 168/91, pulse 69, height 5' 3.5" (1.613 m), weight 125 lb (56.7 kg), SpO2 96%. Body mass index is 21.8 kg/m.  PHYSICAL EXAM:  Exam: General: Well-developed, well-nourished Communication and Voice: slightly raspy Respiratory Respiratory effort: Equal inspiration and expiration without stridor Cardiovascular Peripheral Vascular: Warm extremities with equal color/perfusion Eyes: No nystagmus with equal extraocular motion bilaterally Neuro/Psych/Balance: Patient oriented to person, place, and time; Appropriate mood and affect; Gait is intact with no imbalance; Cranial nerves I-XII are intact Head and Face Inspection: Normocephalic and atraumatic without mass or lesion Palpation: Facial skeleton intact without bony stepoffs Salivary Glands: No mass or tenderness Facial Strength: Facial motility symmetric and full bilaterally ENT Pinna: External ear intact and fully developed External canal: Canal is patent with  intact skin Tympanic Membrane: Clear and mobile External Nose: No scar or anatomic deformity Internal Nose:  Septum was deviated to the right with a large spur and narrowing of right passage. No polyp, or purulence. Mucosal edema and erythema present.  Bilateral inferior turbinate hypertrophy. No epistaxis or septal hematoma Lips, Teeth, and gums: Mucosa and teeth intact and viable TMJ: No pain to palpation with full mobility Oral cavity/oropharynx: No erythema or exudate, no lesions present Nasopharynx: No mass or lesion with intact mucosa Hypopharynx: Intact mucosa without pooling of secretions Larynx Glottic: Full true vocal cord mobility without lesion or mass, mild VF atrophy Supraglottic: Normal appearing epiglottis and AE folds Interarytenoid Space: Moderate pachydermia&edema Subglottic Space: Patent without lesion or edema Neck Neck and Trachea: Midline trachea without mass or lesion Thyroid: No mass or nodularity Lymphatics: No lymphadenopathy  Procedure:  Preoperative diagnosis: throat clearing hx of GERD  Postoperative diagnosis:   Same + GERD LPR  Procedure: Flexible fiberoptic laryngoscopy  Surgeon: Ashok Croon, MD  Anesthesia: Topical lidocaine and Afrin Complications: None Condition is stable throughout exam  Indications and consent:  The patient presents to the clinic with above symptoms. Indirect laryngoscopy view was incomplete. Thus it was recommended that they undergo a flexible fiberoptic laryngoscopy. All of the  risks, benefits, and potential complications were reviewed with the patient preoperatively and verbal informed consent was obtained.  Procedure: The patient was seated upright in the clinic. Topical lidocaine and Afrin were applied to the nasal cavity. After adequate anesthesia had occurred, I then proceeded to pass the flexible telescope into the nasal cavity. The nasal cavity was patent without rhinorrhea or polyp. The nasopharynx was also  patent without mass or lesion. The base of tongue was visualized and was normal. There were no signs of pooling of secretions in the piriform sinuses. The true vocal folds were mobile bilaterally. There were no signs of glottic or supraglottic mucosal lesion or mass. There was moderate interarytenoid pachydermia and post cricoid edema. The telescope was then slowly withdrawn and the patient tolerated the procedure throughout.    PROCEDURE NOTE: nasal endoscopy  Preoperative diagnosis: chronic nasal congestion nasal obstruction symptoms  Postoperative diagnosis: same  Procedure: Diagnostic nasal endoscopy (13086)  Surgeon: Ashok Croon, M.D.  Anesthesia: Topical lidocaine and Afrin  H&P REVIEW: The patient's history and physical were reviewed today prior to procedure. All medications were reviewed and updated as well. Complications: None Condition is stable throughout exam Indications and consent: The patient presents with symptoms of chronic sinusitis not responding to previous therapies. All the risks, benefits, and potential complications were reviewed with the patient preoperatively and informed consent was obtained. The time out was completed with confirmation of the correct procedure.   Procedure: The patient was seated upright in the clinic. Topical lidocaine and Afrin were applied to the nasal cavity. After adequate anesthesia had occurred, the rigid nasal endoscope was passed into the nasal cavity. The nasal mucosa, turbinates, septum, and sinus drainage pathways were visualized bilaterally. This revealed no purulence or significant secretions that might be cultured. There were no polyps or sites of significant inflammation. The mucosa was intact and there was no crusting present. Septum was deviated to the right with a large spur and narrowing of right passage. The scope was then slowly withdrawn and the patient tolerated the procedure well. There were no complications or blood loss.       Studies Reviewed: CT max/face 06/16/2023 CT MAXILLOFACIAL FINDINGS   Osseous: Possible minimally displaced fracture of the nasal bone on the right (series 4, image 14). Mild soft tissue swelling around the nasal bones.   Orbits: Negative. No traumatic or inflammatory finding.   Sinuses: No middle ear or mastoid effusion. Mild mucosal thickening of the floor of bilateral maxillary sinuses.   Soft tissues: Mild soft tissue swelling nasal bones and in the infraorbital soft tissues bilaterally.   CT CERVICAL SPINE FINDINGS   Alignment: Grade 1 anterolisthesis of C3 on C4 and C4 on C5.   Skull base and vertebrae: No acute fracture. No primary bone lesion or focal pathologic process.   Soft tissues and spinal canal: No prevertebral fluid or swelling. No visible canal hematoma.   Disc levels:  No evidence of high-grade spinal canal stenosis   Upper chest: Negative   Other: Multinodular thyroid gland with the largest nodule measuring up to 1.4 cm   IMPRESSION: 1. No CT evidence of intracranial injury. 2. Possible minimally displaced fracture of the nasal bone on the right. Mild soft tissue swelling around the nasal bones and in the infraorbital soft tissues bilaterally. 3. No acute fracture or traumatic subluxation of the cervical spine.  Assessment/Plan: Encounter Diagnoses  Name Primary?   Thyroid nodule Yes   Multinodular goiter    Chronic nasal congestion  Nasal obstruction    Hypertrophy of both inferior nasal turbinates    Environmental and seasonal allergies    Vasomotor rhinitis    Closed fracture of nasal bone, initial encounter    Post-nasal drip    Chronic throat clearing    Chronic GERD     Assessment and Plan    Chronic Nasal Congestion with Septal Deviation/Nasal Obstruction R > L Chronic nasal congestion, particularly on the right side for years. Recent trauma with a nasal bone fracture on the left confirmed by CT scan, non-displaced,  denies nasal deformity and there were no stepoffs on exam. Examination including nasal endoscopy reveals a septal deviation to the right with a significant spur. Discussed medical management as the first line of treatment, including nasal sprays and antihistamines. Surgical intervention (septoplasty) is an option if symptoms persist despite medical management. Patient prefers to avoid surgery due to age and anesthesia concerns. - Prescribe azelastine nasal spray 2 puffs b/l BID - Prescribe Atrovent nasal spray 2 puffs b/l BID - Continue Zyrtec 10 mg daily  - Reassess in 3-4 months if symptoms persist   Throat Clearing  Chronic throat clearing likely secondary to postnasal drip and possible reflux. Examination with flexible laryngoscopy showed changes c/w GERD LPR and some post-nasal drainage. Discussed non-pharmacological management and dietary modifications to reduce reflux. Patient currently uses Gaviscon and was provided information on Reflux Gourmet as an alternative. - Provide handout on reflux triggers - Recommend Reflux Gourmet supplement - Continue Gaviscon as needed  GERD LPR -  Reflux Gourmet after meals - diet and lifestyle changes to minimize GERD - Refer to [employer] blog for dietary and lifestyle modifications/reflux cook book  Thyroid Nodules Long-standing enlarged thyroid with normal thyroid function tests. Recent CT scan incidentally noted thyroid nodules largest 1.4 cm. No prior thyroid U/S. Discussed the need for a thyroid ultrasound to assess the size and characteristics of the nodules to determine if further evaluation, such as biopsy, is necessary. - Order thyroid ultrasound - Review results and determine need for further evaluation or biopsy  Nasal bone fx  - non-displaced and without nasal deformity  - observe, no need for any interventions  Follow-up - Schedule follow-up appointment in 3-4 months - Call patient with thyroid ultrasound results - Patient to  contact office if no follow-up call received after ultrasound.     Thank you for allowing me to participate in the care of this patient. Please do not hesitate to contact me with any questions or concerns.   Ashok Croon, MD Otolaryngology [facility] Phone: [phone] Fax: [phone]    06/28/2023, 10:32 AM

## 2023-06-28 NOTE — Patient Instructions (Signed)

## 2023-07-08 ENCOUNTER — Institutional Professional Consult (permissible substitution) (INDEPENDENT_AMBULATORY_CARE_PROVIDER_SITE_OTHER): Payer: Medicare PPO | Admitting: Otolaryngology

## 2023-07-09 DIAGNOSIS — M542 Cervicalgia: Secondary | ICD-10-CM | POA: Diagnosis not present

## 2023-07-09 DIAGNOSIS — M25559 Pain in unspecified hip: Secondary | ICD-10-CM | POA: Diagnosis not present

## 2023-07-16 DIAGNOSIS — M542 Cervicalgia: Secondary | ICD-10-CM | POA: Diagnosis not present

## 2023-07-16 DIAGNOSIS — M25539 Pain in unspecified wrist: Secondary | ICD-10-CM | POA: Diagnosis not present

## 2023-07-16 DIAGNOSIS — M25559 Pain in unspecified hip: Secondary | ICD-10-CM | POA: Diagnosis not present

## 2023-07-24 DIAGNOSIS — M542 Cervicalgia: Secondary | ICD-10-CM | POA: Diagnosis not present

## 2023-07-24 DIAGNOSIS — M25559 Pain in unspecified hip: Secondary | ICD-10-CM | POA: Diagnosis not present

## 2023-07-24 DIAGNOSIS — M25539 Pain in unspecified wrist: Secondary | ICD-10-CM | POA: Diagnosis not present

## 2023-08-06 DIAGNOSIS — M542 Cervicalgia: Secondary | ICD-10-CM | POA: Diagnosis not present

## 2023-08-06 DIAGNOSIS — M25559 Pain in unspecified hip: Secondary | ICD-10-CM | POA: Diagnosis not present

## 2023-08-06 DIAGNOSIS — M25539 Pain in unspecified wrist: Secondary | ICD-10-CM | POA: Diagnosis not present

## 2023-08-14 DIAGNOSIS — M25559 Pain in unspecified hip: Secondary | ICD-10-CM | POA: Diagnosis not present

## 2023-08-14 DIAGNOSIS — M25539 Pain in unspecified wrist: Secondary | ICD-10-CM | POA: Diagnosis not present

## 2023-08-14 DIAGNOSIS — M542 Cervicalgia: Secondary | ICD-10-CM | POA: Diagnosis not present

## 2023-08-14 NOTE — Progress Notes (Unsigned)
  [facility] [address] [place] [address] Phone: [phone] Subjective:   Tina [name], am serving as a scribe for Dr. Ronnell Coins.  I'm seeing this patient by the request  of:  Panosh, Joaquim Muir, MD  CC: Low back and neck pain follow-up  [id number]  Tina [name] is a 78 y.o. female coming in with complaint of back and neck pain. OMT 06/27/2023. Patient states everything is the same. No new symptoms.  Medications patient has been prescribed: None  Taking:         Reviewed prior external information including notes and imaging from previsou exam, outside providers and external EMR if available.   As well as notes that were available from care everywhere and other healthcare systems.  Past medical history, social, surgical and family history all reviewed in electronic medical record.  No pertanent information unless stated regarding to the chief complaint.   Past Medical History:  Diagnosis Date   High triglycerides    Hx of varicella     No Known Allergies   Review of Systems:  No headache, visual changes, nausea, vomiting, diarrhea, constipation, dizziness, abdominal pain, skin rash, fevers, chills, night sweats, weight loss, swollen lymph nodes, body aches, joint swelling, chest pain, shortness of breath, mood changes. POSITIVE muscle aches  Objective  Blood pressure 122/84, pulse 64, height 5\' 3"  (1.6 m), weight 125 lb (56.7 kg), SpO2 98%.   General: No apparent distress alert and oriented x3 mood and affect normal, dressed appropriately.  HEENT: Pupils equal, extraocular movements intact  Respiratory: Patient's speak in full sentences and does not appear short of breath  Cardiovascular: No lower extremity edema, non tender, no erythema  Gait MSK:  Back overall seems to be doing okay but does have significant tightness noted on the left side of the neck.  Fairly severe.  Osteopathic findings  C3 flexed rotated and  side bent left C7 flexed rotated and side bent left T7 extended rotated and side bent  left  inhaled rib L1 flexed rotated and side bent right Sacrum right on right     Assessment and Plan:  Neck pain on left side Patient does have tightness noted.  Crepitus noted.  Nontender to palpation to in multiple different areas.  Discussed with patient about icing regimen and home exercises still.  Patient will be traveling.  Will continue to monitor.  Worsening pain monitor.  Follow-up again in 6 to 8 weeks.    Nonallopathic problems  Decision today to treat with OMT was based on Physical Exam  After verbal consent patient was treated with HVLA, ME, FPR techniques in cervical, rib, thoracic, lumbar, and sacral  areas  Patient tolerated the procedure well with improvement in symptoms  Patient given exercises, stretches and lifestyle modifications  See medications in patient instructions if given  Patient will follow up in 4-8 weeks    The above documentation has been reviewed and is accurate and complete Tina Lovan M Merril Isakson, DO          Note: This dictation was prepared with Dragon dictation along with smaller phrase technology. Any transcriptional errors that result from this process are unintentional.

## 2023-08-15 ENCOUNTER — Encounter: Payer: Self-pay | Admitting: Family Medicine

## 2023-08-15 ENCOUNTER — Ambulatory Visit: Admitting: Family Medicine

## 2023-08-15 VITALS — BP 122/84 | HR 64 | Ht 63.0 in | Wt 125.0 lb

## 2023-08-15 DIAGNOSIS — M9908 Segmental and somatic dysfunction of rib cage: Secondary | ICD-10-CM

## 2023-08-15 DIAGNOSIS — M542 Cervicalgia: Secondary | ICD-10-CM

## 2023-08-15 DIAGNOSIS — M9904 Segmental and somatic dysfunction of sacral region: Secondary | ICD-10-CM

## 2023-08-15 DIAGNOSIS — M9903 Segmental and somatic dysfunction of lumbar region: Secondary | ICD-10-CM | POA: Diagnosis not present

## 2023-08-15 DIAGNOSIS — M9901 Segmental and somatic dysfunction of cervical region: Secondary | ICD-10-CM | POA: Diagnosis not present

## 2023-08-15 DIAGNOSIS — M9902 Segmental and somatic dysfunction of thoracic region: Secondary | ICD-10-CM

## 2023-08-15 NOTE — Patient Instructions (Addendum)
 Good to see you! Have a great trip See you again in 6-7 weeks

## 2023-08-15 NOTE — Assessment & Plan Note (Signed)
 Patient does have tightness noted.  Crepitus noted.  Nontender to palpation to in multiple different areas.  Discussed with patient about icing regimen and home exercises still.  Patient will be traveling.  Will continue to monitor.  Worsening pain monitor.  Follow-up again in 6 to 8 weeks.

## 2023-09-18 NOTE — Progress Notes (Signed)
 [facility] [address] [place] [address] Phone: [phone] Subjective:   Tina [name] am a scribe for Dr. Felipe Horton.   I'm seeing this patient by the request  of:  Panosh, Joaquim Muir, MD  CC: Back and neck pain follow-up  [id number]  Shakeema Lippman is a 78 y.o. female coming in with complaint of back and neck pain. OMT 08/15/2023. Patient states not bad today. It is a little bit off but not terrible.   Medications patient has been prescribed: None  Taking: none         Reviewed prior external information including notes and imaging from previsou exam, outside providers and external EMR if available.   As well as notes that were available from care everywhere and other healthcare systems.  Past medical history, social, surgical and family history all reviewed in electronic medical record.  No pertanent information unless stated regarding to the chief complaint.   Past Medical History:  Diagnosis Date   High triglycerides    Hx of varicella     No Known Allergies   Review of Systems:  No headache, visual changes, nausea, vomiting, diarrhea, constipation, dizziness, abdominal pain, skin rash, fevers, chills, night sweats, weight loss, swollen lymph nodes, body aches, joint swelling, chest pain, shortness of breath, mood changes. POSITIVE muscle aches  Objective  Blood pressure 130/70, pulse 65, height 5\' 3"  (1.6 m), weight 126 lb 3.2 oz (57.2 kg), SpO2 98%.   General: No apparent distress alert and oriented x3 mood and affect normal, dressed appropriately.  HEENT: Pupils equal, extraocular movements intact  Respiratory: Patient's speak in full sentences and does not appear short of breath  Cardiovascular: No lower extremity edema, non tender, no erythema  MSK:  Back very mild loss lordosis. Neck exam does have significant tightness noted in the left side of the neck.  Tightness in the left trapezius area and significant tightness  noted in the left at her scapula.  Osteopathic findings  C2 flexed rotated and side bent left C7 flexed rotated and side bent left T3 extended rotated and side bent left inhaled rib T9 extended rotated and side bent left L2 flexed rotated and side bent right Sacrum right on right     Assessment and Plan:  Neck pain on left side Continued tightness noted.  Discussed icing regimen of home exercises, discussed which activities to do on the joints to avoid.  Do think stress may play a little bit of a role.  Increase activity slowly.  Follow-up again in 6 to 8 weeks  Thyroid  nodule Incidental finding on CT scan.  From discussed potential ultrasound so we could potentially do surveillance with any type of other testing necessary with it over the course of the next several months.  Follow-up again 6 weeks    Nonallopathic problems  Decision today to treat with OMT was based on Physical Exam  After verbal consent patient was treated with HVLA, ME, FPR techniques in cervical, rib, thoracic, lumbar, and sacral  areas  Patient tolerated the procedure well with improvement in symptoms  Patient given exercises, stretches and lifestyle modifications  See medications in patient instructions if given  Patient will follow up in 4-8 weeks     The above documentation has been reviewed and is accurate and complete Leannah Guse M Mariette Cowley, DO         Note: This dictation was prepared with Dragon dictation along with smaller phrase technology. Any transcriptional errors that result  from this process are unintentional.

## 2023-09-19 DIAGNOSIS — H401131 Primary open-angle glaucoma, bilateral, mild stage: Secondary | ICD-10-CM | POA: Diagnosis not present

## 2023-09-26 ENCOUNTER — Encounter: Payer: Self-pay | Admitting: Family Medicine

## 2023-09-26 ENCOUNTER — Encounter (INDEPENDENT_AMBULATORY_CARE_PROVIDER_SITE_OTHER): Payer: Self-pay | Admitting: Otolaryngology

## 2023-09-26 ENCOUNTER — Ambulatory Visit: Admitting: Family Medicine

## 2023-09-26 VITALS — BP 130/70 | HR 65 | Ht 63.0 in | Wt 126.2 lb

## 2023-09-26 DIAGNOSIS — M9901 Segmental and somatic dysfunction of cervical region: Secondary | ICD-10-CM

## 2023-09-26 DIAGNOSIS — M542 Cervicalgia: Secondary | ICD-10-CM

## 2023-09-26 DIAGNOSIS — M9904 Segmental and somatic dysfunction of sacral region: Secondary | ICD-10-CM | POA: Diagnosis not present

## 2023-09-26 DIAGNOSIS — M9902 Segmental and somatic dysfunction of thoracic region: Secondary | ICD-10-CM

## 2023-09-26 DIAGNOSIS — M9908 Segmental and somatic dysfunction of rib cage: Secondary | ICD-10-CM

## 2023-09-26 DIAGNOSIS — M9903 Segmental and somatic dysfunction of lumbar region: Secondary | ICD-10-CM | POA: Diagnosis not present

## 2023-09-26 DIAGNOSIS — E041 Nontoxic single thyroid nodule: Secondary | ICD-10-CM | POA: Diagnosis not present

## 2023-09-26 NOTE — Assessment & Plan Note (Signed)
 Incidental finding on CT scan.  From discussed potential ultrasound so we could potentially do surveillance with any type of other testing necessary with it over the course of the next several months.  Follow-up again 6 weeks

## 2023-09-26 NOTE — Patient Instructions (Addendum)
 Ultrasound ordered Watch reading positions Enjoy swim meets  See you again in 6-8 weeks

## 2023-09-26 NOTE — Assessment & Plan Note (Signed)
 Continued tightness noted.  Discussed icing regimen of home exercises, discussed which activities to do on the joints to avoid.  Do think stress may play a little bit of a role.  Increase activity slowly.  Follow-up again in 6 to 8 weeks

## 2023-09-27 ENCOUNTER — Ambulatory Visit
Admission: RE | Admit: 2023-09-27 | Discharge: 2023-09-27 | Disposition: A | Discharge status: Home / Self Care | Source: Ambulatory Visit | Attending: Family Medicine | Admitting: Family Medicine

## 2023-09-27 DIAGNOSIS — E042 Nontoxic multinodular goiter: Secondary | ICD-10-CM | POA: Diagnosis not present

## 2023-09-27 DIAGNOSIS — E041 Nontoxic single thyroid nodule: Secondary | ICD-10-CM

## 2023-09-30 ENCOUNTER — Ambulatory Visit: Payer: Self-pay | Admitting: Family Medicine

## 2023-11-13 NOTE — Progress Notes (Unsigned)
  [name] [address] [place] [address] Phone: [phone] Subjective:   Tina [name], am serving as a scribe for Dr. Arthea Claudene. 126# I'm seeing this patient by the request  of:  Panosh, Apolinar POUR, MD  CC: back and neck pain   YEP:Tina [name]  Tina [name] is a 78 y.o. female coming in with complaint of back and neck pain. OMT 09/26/2023. Patient states same per usual. No new symptoms. Doing well.  Medications patient has been prescribed: None  Taking:         Reviewed prior external information including notes and imaging from previsou exam, outside providers and external EMR if available.   As well as notes that were available from care everywhere and other healthcare systems.  Past medical history, social, surgical and family history all reviewed in electronic medical record.  No pertanent information unless stated regarding to the chief complaint.   Past Medical History:  Diagnosis Date   High triglycerides    Hx of varicella     No Known Allergies   Review of Systems:  No headache, visual changes, nausea, vomiting, diarrhea, constipation, dizziness, abdominal pain, skin rash, fevers, chills, night sweats, weight loss, swollen lymph nodes, body aches, joint swelling, chest pain, shortness of breath, mood changes. POSITIVE muscle aches  Objective  Blood pressure 126/78, pulse 67, height 5' 3 (1.6 m), weight 126 lb (57.2 kg), SpO2 97%.   General: No apparent distress alert and oriented x3 mood and affect normal, dressed appropriately.  HEENT: Pupils equal, extraocular movements intact  Respiratory: Patient's speak in full sentences and does not appear short of breath  Cardiovascular: No lower extremity edema, non tender, no erythema  Gait MSK:  Back mild lordosis noted.  Back exam does have some limited sidebending.  More tightness noted in the left parascapular area.  Osteopathic findings  C2 flexed rotated and side  bent right C7 flexed rotated and side bent left T3 extended rotated and side bent right inhaled rib T7 extended rotated and side bent left L2 flexed rotated and side bent right L3 flexed rotated and side bent left Sacrum right on right     Assessment and Plan:  Neck pain on left side No nephritis noted, discussed icing regimen of home exercises, increase activity slowly.  Discussed icing regimen.  Follow-up with me again in 6 to 8 weeks.  Patient will be traveling and could cause potential exacerbation.    Nonallopathic problems  Decision today to treat with OMT was based on Physical Exam  After verbal consent patient was treated with HVLA, ME, FPR techniques in cervical, rib, thoracic, lumbar, and sacral  areas  Patient tolerated the procedure well with improvement in symptoms  Patient given exercises, stretches and lifestyle modifications  See medications in patient instructions if given  Patient will follow up in 4-8 weeks    The above documentation has been reviewed and is accurate and complete Tina Granade [name], DO          Note: This dictation was prepared with Dragon dictation along with smaller phrase technology. Any transcriptional errors that result from this process are unintentional.

## 2023-11-14 ENCOUNTER — Ambulatory Visit: Admitting: Family Medicine

## 2023-11-14 ENCOUNTER — Encounter: Payer: Self-pay | Admitting: Family Medicine

## 2023-11-14 VITALS — BP 126/78 | HR 67 | Ht 63.0 in | Wt 126.0 lb

## 2023-11-14 DIAGNOSIS — M9901 Segmental and somatic dysfunction of cervical region: Secondary | ICD-10-CM

## 2023-11-14 DIAGNOSIS — M9902 Segmental and somatic dysfunction of thoracic region: Secondary | ICD-10-CM

## 2023-11-14 DIAGNOSIS — M9904 Segmental and somatic dysfunction of sacral region: Secondary | ICD-10-CM | POA: Diagnosis not present

## 2023-11-14 DIAGNOSIS — M9903 Segmental and somatic dysfunction of lumbar region: Secondary | ICD-10-CM | POA: Diagnosis not present

## 2023-11-14 DIAGNOSIS — M542 Cervicalgia: Secondary | ICD-10-CM

## 2023-11-14 DIAGNOSIS — M9908 Segmental and somatic dysfunction of rib cage: Secondary | ICD-10-CM | POA: Diagnosis not present

## 2023-11-14 NOTE — Assessment & Plan Note (Signed)
 No nephritis noted, discussed icing regimen of home exercises, increase activity slowly.  Discussed icing regimen.  Follow-up with me again in 6 to 8 weeks.  Patient will be traveling and could cause potential exacerbation.

## 2023-11-14 NOTE — Patient Instructions (Addendum)
 Or would be note you know Ronal right good to see you Look for havalina  Enjoy [place]  See me in 2 months

## 2023-12-06 ENCOUNTER — Encounter: Payer: Self-pay | Admitting: Family Medicine

## 2023-12-06 ENCOUNTER — Other Ambulatory Visit: Payer: Self-pay

## 2023-12-06 MED ORDER — TIZANIDINE HCL 4 MG PO TABS: 4.0000 mg | ORAL_TABLET | Freq: Every day | ORAL | 1 refills | Status: DC

## 2023-12-27 DIAGNOSIS — H401131 Primary open-angle glaucoma, bilateral, mild stage: Secondary | ICD-10-CM | POA: Diagnosis not present

## 2023-12-30 ENCOUNTER — Ambulatory Visit (INDEPENDENT_AMBULATORY_CARE_PROVIDER_SITE_OTHER): Payer: Medicare PPO

## 2023-12-30 VITALS — Ht 63.0 in | Wt 126.0 lb

## 2023-12-30 DIAGNOSIS — Z0000 Encounter for general adult medical examination without abnormal findings: Secondary | ICD-10-CM | POA: Diagnosis not present

## 2023-12-30 NOTE — Patient Instructions (Signed)
 Tina [name],  Thank you for taking the time for your Medicare Wellness Visit. I appreciate your continued commitment to your health goals. Please review the care plan we discussed, and feel free to reach out if I can assist you further.  Medicare recommends these wellness visits once per year to help you and your care team stay ahead of potential health issues. These visits are designed to focus on prevention, allowing your provider to concentrate on managing your acute and chronic conditions during your regular appointments.  Please note that Annual Wellness Visits do not include a physical exam. Some assessments may be limited, especially if the visit was conducted virtually. If needed, we may recommend a separate in-person follow-up with your provider.  Ongoing Care Seeing your primary care provider every 3 to 6 months helps us  monitor your health and provide consistent, personalized care.   Referrals If a referral was made during today's visit and you haven't received any updates within two weeks, please contact the referred provider directly to check on the status.  Recommended Screenings:  Health Maintenance  Topic Date Due   Yearly kidney health urinalysis for diabetes  Never done   Hemoglobin A1C  11/19/2023   Flu Shot  11/22/2023   COVID-19 Vaccine (6 - Pfizer risk 2024-25 season) 12/23/2023   Mammogram  03/06/2024   Yearly kidney function blood test for diabetes  05/21/2024   Eye exam for diabetics  12/26/2024   Medicare Annual Wellness Visit  12/29/2024   DTaP/Tdap/Td vaccine (3 - Td or Tdap) 11/22/2026   Pneumococcal Vaccine for age over 49  Completed   DEXA scan (bone density measurement)  Completed   Hepatitis C Screening  Completed   Zoster (Shingles) Vaccine  Completed   HPV Vaccine  Aged Out   Meningitis B Vaccine  Aged Out   Hepatitis B Vaccine  Discontinued   Colon Cancer Screening  Discontinued       12/30/2023    8:40 AM  Advanced Directives  Does Patient  Have a Medical Advance Directive? Yes  Type of Estate agent of [place];Living will  Does patient want to make changes to medical advance directive? No - Patient declined  Copy of Healthcare Power of Attorney in Chart? Yes - validated most recent copy scanned in chart (See row information)   Advance Care Planning is important because it: Ensures you receive medical care that aligns with your values, goals, and preferences. Provides guidance to your family and loved ones, reducing the emotional burden of decision-making during critical moments.  Vision: Annual vision screenings are recommended for early detection of glaucoma, cataracts, and diabetic retinopathy. These exams can also reveal signs of chronic conditions such as diabetes and high blood pressure.  Dental: Annual dental screenings help detect early signs of oral cancer, gum disease, and other conditions linked to overall health, including heart disease and diabetes.

## 2023-12-30 NOTE — Progress Notes (Signed)
 Subjective:   Please attest and cosign this visit due to patients primary care provider not being in the office at the time the visit was completed.  (Pt of Dr Apolinar Eastern)  Tina [name] is a 78 y.o. who presents for a Medicare Wellness preventive visit.  As a reminder, Annual Wellness Visits don't include a physical exam, and some assessments may be limited, especially if this visit is performed virtually. We may recommend an in-person follow-up visit with your provider if needed.  Visit Complete: Virtual I connected with  Tina [name] on 12/30/23 by a audio enabled telemedicine application and verified that I am speaking with the correct person using two identifiers.  Patient Location: Home  Provider Location: Office/Clinic  I discussed the limitations of evaluation and management by telemedicine. The patient expressed understanding and agreed to proceed.  Vital Signs: Because this visit was a virtual/telehealth visit, some criteria may be missing or patient reported. Any vitals not documented were not able to be obtained and vitals that have been documented are patient reported.  VideoDeclined- This patient declined Librarian, academic. Therefore the visit was completed with audio only.  Persons Participating in Visit: Patient.  AWV Questionnaire: Yes: Patient Medicare AWV questionnaire was completed by the patient on 12/27/2023; I have confirmed that all information answered by patient is correct and no changes since this date.  Cardiac Risk Factors include: advanced age (>44men, >11 women);dyslipidemia     Objective:    Today's Vitals   12/30/23 0840  Weight: 126 lb (57.2 kg)  Height: 5' 3 (1.6 m)   Body mass index is 22.32 kg/m.     12/30/2023    8:40 AM 06/16/2023   12:09 PM 12/26/2022    8:23 AM 12/21/2021    8:31 AM 12/20/2020    3:17 PM 11/16/2019   11:23 AM 07/26/2016    1:52 PM  Advanced Directives  Does Patient Have a Medical Advance  Directive? Yes No Yes Yes Yes Yes Yes   Type of Estate agent of [place];Living will  Healthcare Power of [place];Living will Healthcare Power of [place];Living will Healthcare Power of [place];Living will Healthcare Power of [place];Living will   Does patient want to make changes to medical advance directive? No - Patient declined     No - Patient declined   Copy of Healthcare Power of Attorney in Chart? Yes - validated most recent copy scanned in chart (See row information)  No - copy requested No - copy requested No - copy requested Yes - validated most recent copy scanned in chart (See row information)   Would patient like information on creating a medical advance directive?  No - Patient declined          Data saved with a previous flowsheet row definition    Current Medications (verified) Outpatient Encounter Medications as of 12/30/2023  Medication Sig   azelastine  (ASTELIN ) 0.1 % nasal spray Place 2 sprays into both nostrils 2 (two) times daily. Use in each nostril as directed   CALCIUM CITRATE PO Take by mouth.   cetirizine  (ZYRTEC ) 10 MG tablet Take 1 tablet (10 mg total) by mouth daily.   cholecalciferol (VITAMIN D ) 1000 units tablet Take 1,000 Units by mouth daily.   famotidine (PEPCID) 20 MG tablet Take 20 mg by mouth 2 (two) times daily.   Latanoprost 0.005 % EMUL Place into both eyes at bedtime.   MULTIPLE VITAMIN PO Take by mouth.   Omega-3 Fatty Acids (OMEGA  3 PO) Take by mouth.   tiZANidine  (ZANAFLEX ) 4 MG tablet Take 1 tablet (4 mg total) by mouth at bedtime.   TURMERIC PO Take by mouth.   [DISCONTINUED] ipratropium (ATROVENT ) 0.03 % nasal spray Place 2 sprays into both nostrils every 12 (twelve) hours.   [DISCONTINUED] oxyCODONE -acetaminophen  (PERCOCET/ROXICET) 5-325 MG tablet Take 1 tablet by mouth every 6 (six) hours as needed for severe pain (pain score 7-10).   No facility-administered encounter medications on file as of 12/30/2023.     Allergies (verified) Patient has no known allergies.   History: Past Medical History:  Diagnosis Date   High triglycerides    Hx of varicella    Past Surgical History:  Procedure Laterality Date   CHOLECYSTECTOMY     Family History  Problem Relation Age of Onset   Hypertension Mother    Hyperlipidemia Mother    Stroke Mother    Diabetes Father    Miscarriages / [place] Brother    Social History   Socioeconomic History   Marital status: Divorced    Spouse name: Not on file   Number of children: Not on file   Years of education: Not on file   Highest education level: Doctorate  Occupational History   Occupation: Professor    Employer: [employer]  Tobacco Use   Smoking status: Former    Current packs/day: 0.70    Average packs/day: 0.7 packs/day for 4.0 years (2.8 ttl pk-yrs)    Types: Cigarettes   Smokeless tobacco: Never   Tobacco comments:    in her 20's   Vaping Use   Vaping status: Never Used  Substance and Sexual Activity   Alcohol use: Yes    Alcohol/week: 2.0 standard drinks of alcohol    Types: 1 Glasses of wine, 1 Standard drinks or equivalent per week    Comment: ocassionally   Drug use: No   Sexual activity: Yes  Other Topics Concern   Not on file  Social History Narrative    Now hhof 1 no pets    .    Mother  Moved down to friends home        Divorced one chid and grandchild   Neg ets firearms.    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    Exercises regularly      Neg tad    Social Drivers of Health   Financial Resource Strain: Low Risk  (12/30/2023)   Overall Financial Resource Strain (CARDIA)    Difficulty of Paying Living Expenses: Not hard at all  Food Insecurity: No Food Insecurity (12/30/2023)   Hunger Vital Sign    Worried About Running Out of Food in the Last Year: Never true    Ran Out of Food in the Last Year: Never true  Transportation  Needs: No Transportation Needs (12/30/2023)   PRAPARE - Administrator, Civil Service (Medical): No    Lack of Transportation (Non-Medical): No  Physical Activity: Sufficiently Active (12/30/2023)   Exercise Vital Sign    Days of Exercise per Week: 7 days    Minutes of Exercise per Session: 60 min  Stress: No Stress Concern Present (12/27/2023)   [employer] of Occupational Health - Occupational Stress Questionnaire    Feeling of Stress: Not at all  Social Connections: Moderately Isolated (12/30/2023)   Social Connection and Isolation Panel  Frequency of Communication with Friends and Family: More than three times a week    Frequency of Social Gatherings with Friends and Family: More than three times a week    Attends Religious Services: Never    Database administrator or Organizations: Yes    Attends Engineer, structural: More than 4 times per year    Marital Status: Divorced    Tobacco Counseling Counseling given: Not Answered Tobacco comments: in her 20's     Clinical Intake:  Pre-visit preparation completed: Yes  Pain : No/denies pain     BMI - recorded: 22.32 Nutritional Status: BMI of 19-24  Normal Nutritional Risks: None Diabetes: No  Lab Results  Component Value Date   HGBA1C 6.2 05/22/2023   HGBA1C 6.0 01/03/2022   HGBA1C 5.8 (H) 11/16/2019     How often do you need to have someone help you when you read instructions, pamphlets, or other written materials from your doctor or pharmacy?: 1 - Never  Interpreter Needed?: No  Information entered by :: Verdie Saba, CMA   Activities of Daily Living     12/30/2023    8:44 AM 12/27/2023    8:26 AM  In your present state of health, do you have any difficulty performing the following activities:  Hearing? 0 0  Vision? 0 0  Difficulty concentrating or making decisions? 0 0  Walking or climbing stairs? 0 0  Dressing or bathing? 0 0  Doing errands, shopping? 0 0  Preparing Food and eating  ? N N  Using the Toilet? N N  In the past six months, have you accidently leaked urine? N N  Do you have problems with loss of bowel control? N N  Managing your Medications? N N  Managing your Finances? N N  Housekeeping or managing your Housekeeping? N N    Patient Care Team: Panosh, Apolinar POUR, MD as PCP - General (Internal Medicine) Helga Slice, MD as Attending Physician (Dermatology) Kenn Dunnings, [place] (Optometry) [facility], [place] Debarah Lorrene DEL., MD (Ophthalmology)  I have updated your Care Teams any recent Medical Services you may have received from other providers in the past year.     Assessment:   This is a routine wellness examination for Sean.  Hearing/Vision screen Hearing Screening - Comments:: Denies hearing difficulties   Vision Screening - Comments:: Wears rx glasses - up to date with routine eye exams with  Dr Debarah   Goals Addressed               This Visit's Progress     Patient Stated (pt-stated)        Patient stated that she monitors her weight and diet (sugar intake since strong family hx of Diabetes)       Depression Screen     12/30/2023    8:45 AM 05/22/2023   10:01 AM 12/26/2022    8:21 AM 01/03/2022   10:04 AM 12/21/2021    8:26 AM 09/11/2021   12:24 PM 12/20/2020    3:18 PM  PHQ 2/9 Scores  PHQ - 2 Score 3 0 0 0 0 0 0  PHQ- 9 Score 3   0  0     Fall Risk     12/30/2023    8:44 AM 12/27/2023    8:26 AM 12/26/2022    8:22 AM 01/03/2022   10:05 AM 12/21/2021    8:29 AM  Fall Risk   Falls in the past year? 1  1 1 0 0  Number falls in past yr: 0 0 0 0 0  Comment 1      Injury with Fall? 1 1 0 0 0  Comment nose - small fracture      Risk for fall due to :   No Fall Risks No Fall Risks No Fall Risks  Follow up Falls evaluation completed;Falls prevention discussed  Falls prevention discussed Falls evaluation completed       Data saved with a previous flowsheet row definition    MEDICARE RISK AT HOME:  Medicare Risk at Home Any  stairs in or around the home?: Yes If so, are there any without handrails?: No Home free of loose throw rugs in walkways, pet beds, electrical cords, etc?: Yes Adequate lighting in your home to reduce risk of falls?: Yes Life alert?: No Use of a cane, walker or w/c?: No Grab bars in the bathroom?: No Shower chair or bench in shower?: No Elevated toilet seat or a handicapped toilet?: No  TIMED UP AND GO:  Was the test performed?  No  Cognitive Function: 6CIT completed    07/26/2016    2:03 PM  MMSE - Mini Mental State Exam  Not completed: --        12/30/2023    8:46 AM 12/26/2022    8:23 AM 12/21/2021    8:31 AM 11/16/2019   11:28 AM  6CIT Screen  What Year? 0 points 0 points 0 points 0 points  What month? 0 points 0 points 0 points 0 points  What time? 0 points 0 points 0 points 0 points  Count back from 20 0 points 0 points 0 points 0 points  Months in reverse 0 points 0 points 0 points 0 points  Repeat phrase 0 points 0 points 0 points 0 points  Total Score 0 points 0 points 0 points 0 points    Immunizations Immunization History  Administered Date(s) Administered   Fluad Quad(high Dose 65+) 01/07/2019, 01/26/2021, 02/09/2022, 01/22/2023   Hepatitis B 09/09/1995, 10/11/1995, 07/03/1996   INFLUENZA, HIGH DOSE SEASONAL PF 03/02/2015, 02/03/2016, 01/14/2017, 02/03/2018   Influenza Split 01/22/2012   Influenza-Unspecified 02/03/2014, 01/14/2017, 01/08/2020   Moderna Covid-19 Fall Seasonal Vaccine 31yrs & older 03/16/2022   Moderna SARS-COV2 Booster Vaccination 08/31/2020   PFIZER(Purple Top)SARS-COV-2 Vaccination 05/12/2019, 05/31/2019   Pfizer Covid-19 Vaccine Bivalent Booster 61yrs & up 03/03/2021   Pneumococcal Conjugate-13 07/07/2012   Pneumococcal Polysaccharide-23 03/27/1999, 07/21/2013   Respiratory Syncytial Virus Vaccine,Recomb Aduvanted(Arexvy) 01/16/2022   Td 11/21/2016   Tdap 03/18/2006   Unspecified SARS-COV-2 Vaccination 12/26/2022   Zoster  Recombinant(Shingrix) 10/08/2016, 01/14/2017   Zoster, Live 05/12/2008    Screening Tests Health Maintenance  Topic Date Due   Diabetic kidney evaluation - Urine ACR  Never done   HEMOGLOBIN A1C  11/19/2023   Influenza Vaccine  11/22/2023   COVID-19 Vaccine (6 - Pfizer risk 2024-25 season) 12/23/2023   MAMMOGRAM  03/06/2024   Diabetic kidney evaluation - eGFR measurement  05/21/2024   OPHTHALMOLOGY EXAM  12/26/2024   Medicare Annual Wellness (AWV)  12/29/2024   DTaP/Tdap/Td (3 - Td or Tdap) 11/22/2026   Pneumococcal Vaccine: 50+ Years  Completed   DEXA SCAN  Completed   Hepatitis C Screening  Completed   Zoster Vaccines- Shingrix  Completed   HPV VACCINES  Aged Out   Meningococcal B Vaccine  Aged Out   Hepatitis B Vaccines 19-59 Average Risk  Discontinued   Colonoscopy  Discontinued    Health Maintenance  Items Addressed:  12/30/2023  Additional Screening:  Vision Screening: Recommended annual ophthalmology exams for early detection of glaucoma and other disorders of the eye. Is the patient up to date with their annual eye exam?  Yes  Who is the provider or what is the name of the office in which the patient attends annual eye exams? Dr Debarah of [facility]  Dental Screening: Recommended annual dental exams for proper oral hygiene  Community Resource Referral / Chronic Care Management: CRR required this visit?  No   CCM required this visit?  No   Plan:    I have personally reviewed and noted the following in the patient's chart:   Medical and social history Use of alcohol, tobacco or illicit drugs  Current medications and supplements including opioid prescriptions. Patient is not currently taking opioid prescriptions. Functional ability and status Nutritional status Physical activity Advanced directives List of other physicians Hospitalizations, surgeries, and ER visits in previous 12 months Vitals Screenings to include cognitive, depression, and  falls Referrals and appointments  In addition, I have reviewed and discussed with patient certain preventive protocols, quality metrics, and best practice recommendations. A written personalized care plan for preventive services as well as general preventive health recommendations were provided to patient.   Verdie CHRISTELLA Saba, CMA   12/30/2023   After Visit Summary: (MyChart) Due to this being a telephonic visit, the after visit summary with patients personalized plan was offered to patient via MyChart   Notes: Scheduled a 1-yr Physical w/Dr Charlett for 05/2023.

## 2024-01-14 ENCOUNTER — Ambulatory Visit: Admitting: Family Medicine

## 2024-01-29 NOTE — Progress Notes (Unsigned)
  [name] [address] [place] [address] Phone: [phone] Subjective:   Tina [name], am serving as a scribe for Dr. Arthea Claudene.  I'm seeing this patient by the request  of:  Panosh, Apolinar POUR, MD  CC: back and neck pain follow up   YEP:Tina [name]  Tina [name] is a 78 y.o. female coming in with complaint of back and neck pain. OMT 7/24/205. Patient states that she is doing well.   Medications patient has been prescribed: Zanaflex   Taking:       Reviewed prior external information including notes and imaging from previsou exam, outside providers and external EMR if available.   As well as notes that were available from care everywhere and other healthcare systems.  Past medical history, social, surgical and family history all reviewed in electronic medical record.  No pertanent information unless stated regarding to the chief complaint.   Past Medical History:  Diagnosis Date   High triglycerides    Hx of varicella     No Known Allergies   Review of Systems:  No headache, visual changes, nausea, vomiting, diarrhea, constipation, dizziness, abdominal pain, skin rash, fevers, chills, night sweats, weight loss, swollen lymph nodes, body aches, joint swelling, chest pain, shortness of breath, mood changes. POSITIVE muscle aches  Objective  There were no vitals taken for this visit.   General: No apparent distress alert and oriented x3 mood and affect normal, dressed appropriately.  HEENT: Pupils equal, extraocular movements intact  Respiratory: Patient's speak in full sentences and does not appear short of breath  Cardiovascular: No lower extremity edema, non tender, no erythema  Gait MSK:  Back does have some loss of lordosis noted, some tenderness to palpation in the paraspinal musculature.  Follow-up again in 6 to 8 weeks.  Osteopathic findings  C3 flexed rotated and side bent right C5 flexed rotated and side bent  left T3 extended rotated and side bent right inhaled rib T6 extended rotated and side bent left L2 flexed rotated and side bent right Sacrum right on right    Assessment and Plan:  No problem-specific Assessment & Plan notes found for this encounter.    Nonallopathic problems  Decision today to treat with OMT was based on Physical Exam  After verbal consent patient was treated with HVLA, ME, FPR techniques in cervical, rib, thoracic, lumbar, and sacral  areas  Patient tolerated the procedure well with improvement in symptoms  Patient given exercises, stretches and lifestyle modifications  See medications in patient instructions if given  Patient will follow up in 4-8 weeks      The above documentation has been reviewed and is accurate and complete Tina Craker M Susane Bey, DO        Note: This dictation was prepared with Dragon dictation along with smaller phrase technology. Any transcriptional errors that result from this process are unintentional.

## 2024-01-30 DIAGNOSIS — Z86018 Personal history of other benign neoplasm: Secondary | ICD-10-CM | POA: Diagnosis not present

## 2024-01-30 DIAGNOSIS — D485 Neoplasm of uncertain behavior of skin: Secondary | ICD-10-CM | POA: Diagnosis not present

## 2024-01-30 DIAGNOSIS — D1801 Hemangioma of skin and subcutaneous tissue: Secondary | ICD-10-CM | POA: Diagnosis not present

## 2024-01-30 DIAGNOSIS — L821 Other seborrheic keratosis: Secondary | ICD-10-CM | POA: Diagnosis not present

## 2024-01-30 DIAGNOSIS — L578 Other skin changes due to chronic exposure to nonionizing radiation: Secondary | ICD-10-CM | POA: Diagnosis not present

## 2024-01-30 DIAGNOSIS — D229 Melanocytic nevi, unspecified: Secondary | ICD-10-CM | POA: Diagnosis not present

## 2024-01-30 DIAGNOSIS — Z85828 Personal history of other malignant neoplasm of skin: Secondary | ICD-10-CM | POA: Diagnosis not present

## 2024-01-30 DIAGNOSIS — L814 Other melanin hyperpigmentation: Secondary | ICD-10-CM | POA: Diagnosis not present

## 2024-02-03 ENCOUNTER — Ambulatory Visit: Admitting: Family Medicine

## 2024-02-03 ENCOUNTER — Encounter: Payer: Self-pay | Admitting: Family Medicine

## 2024-02-03 VITALS — BP 122/90 | HR 60 | Ht 63.0 in | Wt 127.0 lb

## 2024-02-03 DIAGNOSIS — M9902 Segmental and somatic dysfunction of thoracic region: Secondary | ICD-10-CM

## 2024-02-03 DIAGNOSIS — M9908 Segmental and somatic dysfunction of rib cage: Secondary | ICD-10-CM

## 2024-02-03 DIAGNOSIS — M9903 Segmental and somatic dysfunction of lumbar region: Secondary | ICD-10-CM | POA: Diagnosis not present

## 2024-02-03 DIAGNOSIS — M9904 Segmental and somatic dysfunction of sacral region: Secondary | ICD-10-CM

## 2024-02-03 DIAGNOSIS — M9901 Segmental and somatic dysfunction of cervical region: Secondary | ICD-10-CM | POA: Diagnosis not present

## 2024-02-03 DIAGNOSIS — M542 Cervicalgia: Secondary | ICD-10-CM | POA: Diagnosis not present

## 2024-02-03 DIAGNOSIS — M545 Low back pain, unspecified: Secondary | ICD-10-CM | POA: Diagnosis not present

## 2024-02-03 NOTE — Assessment & Plan Note (Signed)
 Left-sided low back does have some tightness noted.  Some tenderness to palpation of the paraspinal muscular still noted.  Patient continuing to have some neck pain that likely secondary to more more of her recent travels.  Discussed icing regimen and home exercises.  Follow-up again in 6 to 8 weeks otherwise.

## 2024-02-03 NOTE — Patient Instructions (Signed)
Great to see you See me in  7-8 weeks

## 2024-02-03 NOTE — Assessment & Plan Note (Signed)
 Patient does have tightness in the neck noted.  Some tenderness to palpation in the paraspinal musculature.  Follow-up again in 6 to 8 weeks

## 2024-02-11 DIAGNOSIS — C44311 Basal cell carcinoma of skin of nose: Secondary | ICD-10-CM | POA: Diagnosis not present

## 2024-03-06 ENCOUNTER — Other Ambulatory Visit (HOSPITAL_BASED_OUTPATIENT_CLINIC_OR_DEPARTMENT_OTHER): Payer: Self-pay

## 2024-03-17 DIAGNOSIS — Z1231 Encounter for screening mammogram for malignant neoplasm of breast: Secondary | ICD-10-CM | POA: Diagnosis not present

## 2024-03-17 LAB — HM MAMMOGRAPHY

## 2024-03-18 NOTE — Progress Notes (Unsigned)
  [name] [address] [place] [address] Phone: [phone] Subjective:   Tina [name] Tina [name], am serving as a scribe for Dr. Arthea Claudene.  I'm seeing this patient by the request  of:  Panosh, Apolinar POUR, MD  CC: Back and neck pain follow-up  YEP:Dlagzrupcz  Tina [name] is a 78 y.o. female coming in with complaint of back and neck pain. OMT 02/03/2024. Patient states that her neck is sore and stiff. Had a stomach bug a few weeks ago and she was not as active.   Medications patient has been prescribed: Zanaflex   Taking:         Reviewed prior external information including notes and imaging from previsou exam, outside providers and external EMR if available.   As well as notes that were available from care everywhere and other healthcare systems.  Past medical history, social, surgical and family history all reviewed in electronic medical record.  No pertanent information unless stated regarding to the chief complaint.   Past Medical History:  Diagnosis Date   High triglycerides    Hx of varicella     No Known Allergies   Review of Systems:  No headache, visual changes, nausea, vomiting, diarrhea, constipation, dizziness, abdominal pain, skin rash, fevers, chills, night sweats, weight loss, swollen lymph nodes, body aches, joint swelling, chest pain, shortness of breath, mood changes. POSITIVE muscle aches  Objective  Blood pressure 122/80, pulse 67, height 5' 3 (1.6 m), weight 123 lb (55.8 kg), SpO2 100%.   General: No apparent distress alert and oriented x3 mood and affect normal, dressed appropriately.  HEENT: Pupils equal, extraocular movements intact  Respiratory: Patient's speak in full sentences and does not appear short of breath  Cardiovascular: No lower extremity edema, non tender, no erythema  Left shoulder does have some mild elevation noted compared to the contralateral side.  Discussed with patient's does have some  scapular dyskinesis noted on the left greater than right.  Negative Spurling but does have some limitation in certain range of motion noted.  Osteopathic findings  C2 flexed rotated and side bent right C6 flexed rotated and side bent left T3 extended rotated and side bent left inhaled rib T9 extended rotated and side bent left inhaled rib     Assessment and Plan:  Neck pain on left side Chronic problem with mild exacerbation.  Patient will be traveling.  We discussed the different exercises patient can do while she is traveling.  Patient has different medications for any breakthrough pain.  Increase activity slowly.  Discussed icing regimen.  Follow-up again in 6 to 12 weeks.    Nonallopathic problems  Decision today to treat with OMT was based on Physical Exam  After verbal consent patient was treated with HVLA, ME, FPR techniques in cervical, rib, thoracic,  areas  Patient tolerated the procedure well with improvement in symptoms  Patient given exercises, stretches and lifestyle modifications  See medications in patient instructions if given  Patient will follow up in 4-8 weeks    The above documentation has been reviewed and is accurate and complete Tina Ems M Jalyssa Fleisher, DO          Note: This dictation was prepared with Dragon dictation along with smaller phrase technology. Any transcriptional errors that result from this process are unintentional.

## 2024-03-24 ENCOUNTER — Encounter: Payer: Self-pay | Admitting: Family Medicine

## 2024-03-24 ENCOUNTER — Ambulatory Visit: Admitting: Family Medicine

## 2024-03-24 VITALS — BP 122/80 | HR 67 | Ht 63.0 in | Wt 123.0 lb

## 2024-03-24 DIAGNOSIS — M9902 Segmental and somatic dysfunction of thoracic region: Secondary | ICD-10-CM | POA: Diagnosis not present

## 2024-03-24 DIAGNOSIS — M542 Cervicalgia: Secondary | ICD-10-CM | POA: Diagnosis not present

## 2024-03-24 DIAGNOSIS — M9901 Segmental and somatic dysfunction of cervical region: Secondary | ICD-10-CM | POA: Diagnosis not present

## 2024-03-24 DIAGNOSIS — M9908 Segmental and somatic dysfunction of rib cage: Secondary | ICD-10-CM | POA: Diagnosis not present

## 2024-03-24 NOTE — Patient Instructions (Signed)
 Good to see you! Have a good time in [place] See you again in 6-8 weeks

## 2024-03-24 NOTE — Assessment & Plan Note (Signed)
 Chronic problem with mild exacerbation.  Patient will be traveling.  We discussed the different exercises patient can do while she is traveling.  Patient has different medications for any breakthrough pain.  Increase activity slowly.  Discussed icing regimen.  Follow-up again in 6 to 12 weeks.

## 2024-03-26 DIAGNOSIS — H401131 Primary open-angle glaucoma, bilateral, mild stage: Secondary | ICD-10-CM | POA: Diagnosis not present

## 2024-05-12 NOTE — Progress Notes (Unsigned)
 " [name] [address] [place] [address] Phone: [phone] Subjective:   Tina [name] am a scribe for Dr. Claudene.   I'm seeing this patient by the request  of:  Panosh, Apolinar POUR, MD  CC: Back and neck pain follow-up  YEP:Dlagzrupcz  Tina [name] is a 79 y.o. female coming in with complaint of back and neck pain. OMT on 03/24/2024. Patient states that the back and neck are ok today. It isn't bad.   Medications patient has been prescribed:   Taking:         Reviewed prior external information including notes and imaging from previsou exam, outside providers and external EMR if available.   As well as notes that were available from care everywhere and other healthcare systems.  Past medical history, social, surgical and family history all reviewed in electronic medical record.  No pertanent information unless stated regarding to the chief complaint.   Past Medical History:  Diagnosis Date   High triglycerides    Hx of varicella     Allergies[1]   Review of Systems:  No headache, visual changes, nausea, vomiting, diarrhea, constipation, dizziness, abdominal pain, skin rash, fevers, chills, night sweats, weight loss, swollen lymph nodes, body aches, joint swelling, chest pain, shortness of breath, mood changes. POSITIVE muscle aches  Objective  Blood pressure 130/70, pulse 65, height 5' 3 (1.6 m), weight 126 lb (57.2 kg), SpO2 98%.   General: No apparent distress alert and oriented x3 mood and affect normal, dressed appropriately.  HEENT: Pupils equal, extraocular movements intact  Respiratory: Patient's speak in full sentences and does not appear short of breath  Cardiovascular: No lower extremity edema, non tender, no erythema  Gait MSK:  Back still more right sided lower back and left side of the neck noted.  Negative Spurling's and negative straight leg test.  Good strength.  Scapular dyskinesis noted.  Osteopathic  findings  C2 flexed rotated and side bent left C6 flexed rotated and side bent left T3 extended rotated and side bent left inhaled rib T5 extended rotated and side bent left inhaler L1 flexed rotated and side bent right Sacrum right on right       Assessment and Plan:  Left-sided back pain Patient did do a lot of traveling and is doing much better overall.  Discussed icing regimen and home exercises, discussed which activities to do and which ones to avoid.  Increase activity slowly.  Discussed icing regimen.  Patient did not sprain her toe that likely contributed to some antalgic gait.  Discussed with patient about icing regimen and home exercises otherwise.  Increase activity slowly.  Follow-up again in 6 to 8 weeks.    Nonallopathic problems  Decision today to treat with OMT was based on Physical Exam  After verbal consent patient was treated with HVLA, ME, FPR techniques in cervical, rib, thoracic, lumbar, and sacral  areas  Patient tolerated the procedure well with improvement in symptoms  Patient given exercises, stretches and lifestyle modifications  See medications in patient instructions if given  Patient will follow up in 4-8 weeks     The above documentation has been reviewed and is accurate and complete Tina Lamere M Fady Stamps, DO         Note: This dictation was prepared with Dragon dictation along with smaller phrase technology. Any transcriptional errors that result from this process are unintentional.            [1] No Known  Allergies  "

## 2024-05-13 ENCOUNTER — Ambulatory Visit: Admitting: Family Medicine

## 2024-05-13 ENCOUNTER — Encounter: Payer: Self-pay | Admitting: Family Medicine

## 2024-05-13 VITALS — BP 130/70 | HR 65 | Ht 63.0 in | Wt 126.0 lb

## 2024-05-13 DIAGNOSIS — M545 Low back pain, unspecified: Secondary | ICD-10-CM

## 2024-05-13 DIAGNOSIS — M9904 Segmental and somatic dysfunction of sacral region: Secondary | ICD-10-CM

## 2024-05-13 DIAGNOSIS — M9903 Segmental and somatic dysfunction of lumbar region: Secondary | ICD-10-CM

## 2024-05-13 DIAGNOSIS — M9901 Segmental and somatic dysfunction of cervical region: Secondary | ICD-10-CM | POA: Diagnosis not present

## 2024-05-13 DIAGNOSIS — M9908 Segmental and somatic dysfunction of rib cage: Secondary | ICD-10-CM

## 2024-05-13 DIAGNOSIS — M9902 Segmental and somatic dysfunction of thoracic region: Secondary | ICD-10-CM | POA: Diagnosis not present

## 2024-05-13 NOTE — Assessment & Plan Note (Signed)
 Patient did do a lot of traveling and is doing much better overall.  Discussed icing regimen and home exercises, discussed which activities to do and which ones to avoid.  Increase activity slowly.  Discussed icing regimen.  Patient did not sprain her toe that likely contributed to some antalgic gait.  Discussed with patient about icing regimen and home exercises otherwise.  Increase activity slowly.  Follow-up again in 6 to 8 weeks.

## 2024-05-13 NOTE — Patient Instructions (Signed)
 Good to see you. Glad the toe is the only fall out. See me again 6 weeks.

## 2024-05-26 ENCOUNTER — Encounter: Admitting: Internal Medicine

## 2024-05-28 ENCOUNTER — Other Ambulatory Visit: Payer: Self-pay | Admitting: Family Medicine

## 2024-06-26 ENCOUNTER — Ambulatory Visit: Admitting: Family Medicine

## 2024-07-15 ENCOUNTER — Encounter: Admitting: Internal Medicine

## 2024-12-30 ENCOUNTER — Ambulatory Visit
